# Patient Record
Sex: Female | Born: 1970 | Race: White | Hispanic: Refuse to answer | Marital: Single | Smoking: Light tobacco smoker
Health system: Southern US, Community
[De-identification: ages and names within clinical notes are randomized; demographics above are authoritative.]

## PROBLEM LIST (undated history)

## (undated) DIAGNOSIS — F419 Anxiety disorder, unspecified: Secondary | ICD-10-CM

## (undated) DIAGNOSIS — F329 Major depressive disorder, single episode, unspecified: Secondary | ICD-10-CM

## (undated) DIAGNOSIS — L732 Hidradenitis suppurativa: Secondary | ICD-10-CM

## (undated) DIAGNOSIS — E785 Hyperlipidemia, unspecified: Secondary | ICD-10-CM

## (undated) DIAGNOSIS — E119 Type 2 diabetes mellitus without complications: Secondary | ICD-10-CM

## (undated) DIAGNOSIS — M797 Fibromyalgia: Secondary | ICD-10-CM

## (undated) DIAGNOSIS — F32A Depression, unspecified: Secondary | ICD-10-CM

## (undated) DIAGNOSIS — I509 Heart failure, unspecified: Secondary | ICD-10-CM

## (undated) DIAGNOSIS — I10 Essential (primary) hypertension: Secondary | ICD-10-CM

## (undated) DIAGNOSIS — C4490 Unspecified malignant neoplasm of skin, unspecified: Secondary | ICD-10-CM

## (undated) DIAGNOSIS — T8859XA Other complications of anesthesia, initial encounter: Secondary | ICD-10-CM

## (undated) DIAGNOSIS — G4730 Sleep apnea, unspecified: Secondary | ICD-10-CM

## (undated) DIAGNOSIS — I2510 Atherosclerotic heart disease of native coronary artery without angina pectoris: Secondary | ICD-10-CM

## (undated) DIAGNOSIS — G629 Polyneuropathy, unspecified: Secondary | ICD-10-CM

## (undated) HISTORY — DX: Hidradenitis suppurativa: L73.2

## (undated) HISTORY — PX: STOMACH SURGERY: SHX791

## (undated) HISTORY — DX: Major depressive disorder, single episode, unspecified: F32.9

## (undated) HISTORY — PX: FOOT SURGERY: SHX648

## (undated) HISTORY — DX: Type 2 diabetes mellitus without complications: E11.9

## (undated) HISTORY — PX: HYDRADENITIS EXCISION: SHX5243

## (undated) HISTORY — DX: Hyperlipidemia, unspecified: E78.5

## (undated) HISTORY — DX: Anxiety disorder, unspecified: F41.9

## (undated) HISTORY — DX: Depression, unspecified: F32.A

## (undated) HISTORY — DX: Heart failure, unspecified: I50.9

## (undated) HISTORY — DX: Other complications of anesthesia, initial encounter: T88.59XA

## (undated) HISTORY — DX: Fibromyalgia: M79.7

## (undated) HISTORY — DX: Atherosclerotic heart disease of native coronary artery without angina pectoris: I25.10

## (undated) HISTORY — DX: Essential (primary) hypertension: I10

## (undated) HISTORY — DX: Unspecified malignant neoplasm of skin, unspecified: C44.90

## (undated) HISTORY — DX: Polyneuropathy, unspecified: G62.9

## (undated) HISTORY — DX: Sleep apnea, unspecified: G47.30

---

## 1996-07-12 HISTORY — PX: REDUCTION MAMMAPLASTY: SUR839

## 2012-10-11 DIAGNOSIS — L732 Hidradenitis suppurativa: Secondary | ICD-10-CM | POA: Insufficient documentation

## 2012-11-23 ENCOUNTER — Other Ambulatory Visit: Payer: Self-pay | Admitting: *Deleted

## 2012-11-23 DIAGNOSIS — N63 Unspecified lump in unspecified breast: Secondary | ICD-10-CM

## 2012-11-23 DIAGNOSIS — N644 Mastodynia: Secondary | ICD-10-CM

## 2012-11-28 DIAGNOSIS — E1159 Type 2 diabetes mellitus with other circulatory complications: Secondary | ICD-10-CM | POA: Insufficient documentation

## 2012-11-28 DIAGNOSIS — Z720 Tobacco use: Secondary | ICD-10-CM | POA: Insufficient documentation

## 2012-12-07 ENCOUNTER — Ambulatory Visit
Admission: RE | Admit: 2012-12-07 | Discharge: 2012-12-07 | Disposition: A | Discharge status: Home / Self Care | Payer: Private Health Insurance - Indemnity | Source: Ambulatory Visit | Attending: *Deleted | Admitting: *Deleted

## 2012-12-07 ENCOUNTER — Other Ambulatory Visit: Payer: Self-pay | Admitting: Nurse Practitioner

## 2012-12-07 DIAGNOSIS — N644 Mastodynia: Secondary | ICD-10-CM

## 2012-12-07 DIAGNOSIS — N63 Unspecified lump in unspecified breast: Secondary | ICD-10-CM

## 2013-02-09 DIAGNOSIS — IMO0002 Reserved for concepts with insufficient information to code with codable children: Secondary | ICD-10-CM | POA: Insufficient documentation

## 2013-02-09 DIAGNOSIS — N83201 Unspecified ovarian cyst, right side: Secondary | ICD-10-CM | POA: Insufficient documentation

## 2013-06-27 DIAGNOSIS — M7750 Other enthesopathy of unspecified foot: Secondary | ICD-10-CM | POA: Insufficient documentation

## 2013-06-27 DIAGNOSIS — M7752 Other enthesopathy of left foot: Secondary | ICD-10-CM | POA: Insufficient documentation

## 2013-07-03 ENCOUNTER — Ambulatory Visit (INDEPENDENT_AMBULATORY_CARE_PROVIDER_SITE_OTHER): Payer: Private Health Insurance - Indemnity | Admitting: Surgery

## 2013-08-06 ENCOUNTER — Encounter (INDEPENDENT_AMBULATORY_CARE_PROVIDER_SITE_OTHER): Payer: Self-pay | Admitting: Surgery

## 2013-08-06 ENCOUNTER — Encounter (INDEPENDENT_AMBULATORY_CARE_PROVIDER_SITE_OTHER): Payer: Self-pay

## 2013-08-06 ENCOUNTER — Ambulatory Visit (INDEPENDENT_AMBULATORY_CARE_PROVIDER_SITE_OTHER): Payer: Private Health Insurance - Indemnity | Admitting: Surgery

## 2013-08-06 VITALS — BP 122/78 | HR 88 | Temp 97.6°F | Resp 14 | Ht 63.0 in | Wt 228.0 lb

## 2013-08-06 DIAGNOSIS — L732 Hidradenitis suppurativa: Secondary | ICD-10-CM

## 2013-08-06 NOTE — Progress Notes (Signed)
Patient ID: Kristin Mejia, female   DOB: 30-Jun-1971, 43 y.o.   MRN: [id number]  Chief Complaint  Patient presents with  . New Evaluation    eval bil breast/abscesses?    HPI Kristin Mejia is a 43 y.o. female.  Patient presents for evaluation of hidradenitis involving both breasts. She has a history of bilateral breast reduction in the late 90s. She had significant hidradenitis until then and this is improved after breast reduction. She is developed recurrent hidradenitis involving both breasts along the inferior mammary fold medially. She isn't interested in further surgery treating these areas and has questions about further breast reduction the areas drain from time to time. No fever or chills currently. She continues to smoke. HPI  Past Medical History  Diagnosis Date  . Diabetes mellitus without complication   . Hyperlipidemia   . Hydradenitis     Past Surgical History  Procedure Laterality Date  . Foot surgery      bone removal/tendon replacement  . Stomach surgery      tummy tuck  . Cesarean section    . Hydradenitis excision      12 x 1998/2005 on both thighs/axilla/breast    Family History  Problem Relation Age of Onset  . Cancer Maternal Aunt     throat  . Cancer Maternal Grandfather     throat    Social History History  Substance Use Topics  . Smoking status: Current Every Day Smoker -- 1.00 packs/day  . Smokeless tobacco: Never Used  . Alcohol Use: No    Allergies  Allergen Reactions  . Wellbutrin [Bupropion] Other (See Comments)    Increased heart rate  . Enablex [Darifenacin Hydrobromide Er] Rash  . Lyrica [Pregabalin] Rash  . Vesicare [Solifenacin] Rash    Current Outpatient Prescriptions  Medication Sig Dispense Refill  . cyclobenzaprine (FLEXERIL) 10 MG tablet Take 10 mg by mouth 3 (three) times daily as needed for muscle spasms.      Marland Kitchen escitalopram (LEXAPRO) 20 MG tablet Take 20 mg by mouth daily.      Marland Kitchen glipiZIDE (GLUCOTROL XL) 10 MG  24 hr tablet Take 10 mg by mouth daily with breakfast.      . hydrochlorothiazide (HYDRODIURIL) 25 MG tablet Take 25 mg by mouth daily.      Marland Kitchen HYDROcodone-acetaminophen (NORCO) 10-325 MG per tablet Take 1 tablet by mouth every 6 (six) hours as needed.      Marland Kitchen LORazepam (ATIVAN) 2 MG tablet Take 2 mg by mouth every 6 (six) hours as needed for anxiety.      . metformin (FORTAMET) 1000 MG (OSM) 24 hr tablet Take 1,000 mg by mouth daily with breakfast.      . morphine (MSIR) 15 MG tablet Take 15 mg by mouth every 4 (four) hours as needed for severe pain.      Marland Kitchen triazolam (HALCION) 0.25 MG tablet Take 0.25 mg by mouth at bedtime as needed for sleep.       No current facility-administered medications for this visit.    Review of Systems Review of Systems  Constitutional: Negative.   HENT: Negative.   Eyes: Negative.   Respiratory: Negative.   Cardiovascular: Negative.   Gastrointestinal: Negative.   Endocrine: Negative.   Genitourinary: Negative.   Allergic/Immunologic: Negative.   Neurological: Negative.   Hematological: Negative.   Psychiatric/Behavioral: Negative.     Blood pressure 122/78, pulse 88, temperature 97.6 F (36.4 C), temperature source Temporal, resp. rate 14, height 5\' 3"  (1.6 m),  weight 228 lb (103.42 kg).  Physical Exam Physical Exam  Constitutional: She is oriented to person, place, and time. She appears well-developed and well-nourished.  HENT:  Head: Normocephalic and atraumatic.  Eyes: Pupils are equal, round, and reactive to light. No scleral icterus.  Neck: Normal range of motion. Neck supple.  Pulmonary/Chest:    Neurological: She is alert and oriented to person, place, and time.  Skin: Skin is warm and dry.  Psychiatric: She has a normal mood and affect. Her behavior is normal. Judgment and thought content normal.    Data Reviewed NONE  Assessment    HIDRADENITIS BILATERAL INFERIOR MAMMARY FOLD BILATERALLY    Plan    Pt desires consultation  with plastic surgeon for consideration of further breast reduction and treatment of hidradenitis of her breasts.  Her disease is minimal but I think and evaluation helpful.  No active infection noted currently. Follow up here as needed.        Kristin Mejia A. 08/06/2013, 3:49 PM

## 2013-08-08 ENCOUNTER — Telehealth (INDEPENDENT_AMBULATORY_CARE_PROVIDER_SITE_OTHER): Payer: Self-pay | Admitting: *Deleted

## 2013-08-08 NOTE — Telephone Encounter (Signed)
LMOM for pt to return my call.  I was calling to notify her of the appt with Dr. Kelly SplinterSanger on 08/21/13 @ 10:30am.  Their phone number is [phone] if pt needs to reschedule.  Their address is [address]

## 2013-08-13 DIAGNOSIS — R748 Abnormal levels of other serum enzymes: Secondary | ICD-10-CM | POA: Insufficient documentation

## 2013-08-13 DIAGNOSIS — E669 Obesity, unspecified: Secondary | ICD-10-CM | POA: Insufficient documentation

## 2013-08-13 DIAGNOSIS — E66812 Obesity, class 2: Secondary | ICD-10-CM | POA: Insufficient documentation

## 2013-08-16 NOTE — Telephone Encounter (Signed)
LMOM for pt to return my call to give her the below information.

## 2013-11-23 DIAGNOSIS — R5383 Other fatigue: Secondary | ICD-10-CM | POA: Insufficient documentation

## 2014-01-28 DIAGNOSIS — Z85828 Personal history of other malignant neoplasm of skin: Secondary | ICD-10-CM | POA: Insufficient documentation

## 2014-03-01 ENCOUNTER — Other Ambulatory Visit: Payer: Self-pay

## 2014-03-01 DIAGNOSIS — Z1231 Encounter for screening mammogram for malignant neoplasm of breast: Secondary | ICD-10-CM

## 2014-03-14 ENCOUNTER — Ambulatory Visit: Payer: Private Health Insurance - Indemnity

## 2014-03-25 ENCOUNTER — Ambulatory Visit
Admission: RE | Admit: 2014-03-25 | Discharge: 2014-03-25 | Disposition: A | Discharge status: Home / Self Care | Payer: Managed Care, Other (non HMO) | Source: Ambulatory Visit

## 2014-03-25 DIAGNOSIS — Z1231 Encounter for screening mammogram for malignant neoplasm of breast: Secondary | ICD-10-CM

## 2014-08-22 ENCOUNTER — Ambulatory Visit
Admission: RE | Admit: 2014-08-22 | Discharge: 2014-08-22 | Disposition: A | Discharge status: Home / Self Care | Payer: Managed Care, Other (non HMO) | Source: Ambulatory Visit | Attending: Gastroenterology | Admitting: Gastroenterology

## 2014-08-22 ENCOUNTER — Other Ambulatory Visit: Payer: Self-pay | Admitting: Gastroenterology

## 2014-08-22 DIAGNOSIS — R109 Unspecified abdominal pain: Secondary | ICD-10-CM

## 2015-08-18 ENCOUNTER — Other Ambulatory Visit: Payer: Self-pay

## 2015-08-18 DIAGNOSIS — Z1231 Encounter for screening mammogram for malignant neoplasm of breast: Secondary | ICD-10-CM

## 2015-08-29 ENCOUNTER — Ambulatory Visit
Admission: RE | Admit: 2015-08-29 | Discharge: 2015-08-29 | Disposition: A | Discharge status: Home / Self Care | Payer: Managed Care, Other (non HMO) | Source: Ambulatory Visit

## 2015-08-29 DIAGNOSIS — Z1231 Encounter for screening mammogram for malignant neoplasm of breast: Secondary | ICD-10-CM

## 2015-09-04 DIAGNOSIS — E1169 Type 2 diabetes mellitus with other specified complication: Secondary | ICD-10-CM | POA: Insufficient documentation

## 2015-09-04 DIAGNOSIS — E785 Hyperlipidemia, unspecified: Secondary | ICD-10-CM

## 2015-09-12 ENCOUNTER — Emergency Department (HOSPITAL_COMMUNITY)
Admission: EM | Admit: 2015-09-12 | Discharge: 2015-09-12 | Disposition: A | Discharge status: Home / Self Care | Payer: Managed Care, Other (non HMO) | Attending: Emergency Medicine | Admitting: Emergency Medicine

## 2015-09-12 ENCOUNTER — Encounter (HOSPITAL_COMMUNITY): Payer: Self-pay | Admitting: Emergency Medicine

## 2015-09-12 DIAGNOSIS — E119 Type 2 diabetes mellitus without complications: Secondary | ICD-10-CM | POA: Insufficient documentation

## 2015-09-12 DIAGNOSIS — R2232 Localized swelling, mass and lump, left upper limb: Secondary | ICD-10-CM | POA: Diagnosis not present

## 2015-09-12 DIAGNOSIS — Z79899 Other long term (current) drug therapy: Secondary | ICD-10-CM | POA: Diagnosis not present

## 2015-09-12 DIAGNOSIS — F17200 Nicotine dependence, unspecified, uncomplicated: Secondary | ICD-10-CM | POA: Diagnosis not present

## 2015-09-12 DIAGNOSIS — Z7984 Long term (current) use of oral hypoglycemic drugs: Secondary | ICD-10-CM | POA: Diagnosis not present

## 2015-09-12 DIAGNOSIS — Z23 Encounter for immunization: Secondary | ICD-10-CM | POA: Insufficient documentation

## 2015-09-12 MED ORDER — LIDOCAINE-EPINEPHRINE (PF) 2 %-1:200000 IJ SOLN: 10.0000 mL | Freq: Once | INTRAMUSCULAR | Status: DC

## 2015-09-12 MED ORDER — LIDOCAINE-EPINEPHRINE (PF) 2 %-1:200000 IJ SOLN
INTRAMUSCULAR | Status: AC
  Filled 2015-09-12: qty 20

## 2015-09-12 MED ORDER — TETANUS-DIPHTH-ACELL PERTUSSIS 5-2.5-18.5 LF-MCG/0.5 IM SUSP
0.5000 mL | Freq: Once | INTRAMUSCULAR | Status: AC
  Administered 2015-09-12: 0.5 mL via INTRAMUSCULAR
  Filled 2015-09-12: qty 0.5

## 2015-09-12 NOTE — Discharge Instructions (Signed)
You have been seen today for an axillary mass. It is recommended that you follow-up with a general surgeon using the number provided for reevaluation and chronic management of this issue. Follow up with PCP as needed. Return to ED should symptoms worsen. Ibuprofen or Tylenol may be used for pain.  RESOURCE GUIDE  Chronic Pain Problems: Contact [facility]  [phone] Patients need to be referred by their primary care doctor.  Insufficient Money for Medicine: Contact United Way:  call "211" or [facility] 409-100-2274.  No Primary Care Doctor: - Call [facility]  [phone] - can help you locate a primary care doctor that  accepts your insurance, provides certain services, etc. - Physician Referral Service- [phone]  Agencies that provide inexpensive medical care: - [facility]  [phone] - [facility]  [phone] - [facility]  [phone] - Women's Clinic  [phone] - Planned Parenthood  [phone] [facility]  [phone]  Medicaid-accepting [facility] Providers: - [facility]- [address]  [phone], Mon-Fri 9am-7pm, Sat 9am-1pm - [facility]- [address] [place], Suite [place]  [phone] - [facility]- [address], [address] [place]  [phone] [facility]- [address]  [phone] - Renaye Rakers- [address] [place], [address], [phone]  Only accepts [place] patients after they have their name  applied to their card  Self Pay (no insurance) in [place]: - Sickle Cell Patients: Dr Willey Blade, [facility]  [address] [place], [phone] - [facility]- [address] [place]  [phone]       [facility] [place]- [address]       -     [facility]- see information above (Speak to [employer] if you do not have  insurance)       -  [facility]- [address] [place], [phone]       -  [facility]- [address],  [phone]       -  [facility]- [address], [phone]       -  Dr Julio Sicks-  [address], [place], [phone]       -  [facility]- [address] [phone]       -  [facility]- [address] [phone], also [address] [phone]       -    [facility]- [address] [place], [phone], 1st & 3rd Saturday   every month, 10am-1pm  1) Find a Doctor and Pay Out of Pocket Although you won't have to find out who is covered by your insurance plan, it is a good idea to ask around and get recommendations. You will then need to call the office and see if the doctor you have chosen will accept you as a new patient and what types of options they offer for patients who are self-pay. Some doctors offer discounts or will set up payment plans for their patients who do not have insurance, but you will need to ask so you aren't surprised when you get to your appointment.  2) Contact Your Local Health Department Not all health departments have doctors that can see patients for sick  visits, but many do, so it is worth a call to see if yours does. If you don't know where your local health department is, you can check in your phone book. The CDC also has a tool to help you locate your state's health department, and many state websites also have listings of all of their local health departments.  3) Find a Walk-in Clinic If your illness is not likely to be very severe or complicated, you may want to try a walk in clinic. These are popping up all over the country in pharmacies, drugstores, and shopping centers. They're usually staffed by nurse practitioners or physician assistants that have been trained to treat common illnesses and complaints. They're usually fairly quick and inexpensive. However, if you have serious  medical issues or chronic medical problems, these are probably not your best option  STD Testing - [facility] of [facility] [place], STD Clinic, [address], [place], phone [phone] or [phone].  Monday - Friday, call for an appointment. [facility]- [place] Department of [facility] [place], STD Clinic, [place], [place], phone [phone] or [phone].  Monday - Friday, call for an appointment.  Abuse/Neglect: [facility]- [place] Child Abuse Hotline [phone](336) (440)[phone] [facility] [place] Child Abuse Hotline 6302110733(747)491-0259 (After Hours)  Emergency Shelter:  [facility] [phone](336) [phone]  Maternity Homes: - [facility] [phone](336) [phone] - [facility] [phone]) [phone]  MRSA Hotline #:   2016051983(513) 107-5001  [facility] Resources  [facility]  United Way [facility]. [address]                 [address]         [address] [place]  [place]                                               [place]                              [place] Phone:  [phone]                                  Phone:  [phone]                   Phone:  [phone]  [facility], [phone] - [facility] - [phone]- [phone]       -     [facility] in [place], [address],                                  [phone], [facility]  [place] Child Abuse Hotline 505 244 9127(336) 727-357-3921 or 303-628-2389(336) 319-021-0607 (After Hours)   Behavioral Health Services  Substance Abuse Resources: - [facility]  [phone] - [facility] (539) [phone] - [facility] ([phone] Floydene Flock- [facility] [phone] - Residential & Outpatient Substance Abuse Program  [phone]  Psychological Services: Tressie Ellis- [facility]  [phone] [phone] - [facility]  [phone] - [facility]  [facility], [phone] [place] [address], [place], ACCESS LINE: [phone]  or (541)[phone], [employer]za  Dental Assistance  If unable to pay or uninsured, contact:  [facility] or [facility]. to become qualified for the adult dental clinic.  Patients with Medicaid: [facility] [phone] [address], [phone] [address], [phone]  If unable to pay, or uninsured, contact [facility] [phone]) or [facility] Department [phone] in [place], [phone] in [facility]) to become qualified for the adult dental clinic   Other Low-Cost Community Dental Services: - [facility]- [address] [place], [place], [place], [address], [phone], Ext. [address], 2nd and 4th Thursday of the month at 6:30am.  10 clients each day by appointment, can sometimes see walk-in patients if someone does not show for an appointment. [facility]- [address] [place]ins [place], [place], [address], [phone] - [facility]- [address] [place], [place], [address], [phone] - [facility]- [phone] [facility]- [phone] [facility]- [phone]

## 2015-09-12 NOTE — ED Notes (Signed)
Pt states hx of axillary abscesses.  States that she has one under her lt arm x 2 wks.  No fevers, chills, vomiting, or diarrhea.

## 2015-09-12 NOTE — ED Provider Notes (Signed)
CSN: [id number]     Arrival date & time 09/12/15  1009 History   First MD Initiated Contact with Patient 09/12/15 1105     Chief Complaint  Patient presents with  . Abscess     (Consider location/radiation/quality/duration/timing/severity/associated sxs/prior Treatment) HPI   Kristin Mejia is a 45 y.o. female, with a history of hydradenitis, presenting to the ED with a left axillary mass, but the patient describes as a cyst or an abscess. Patient first noticed it 2 weeks ago. States that she gets these all over her body frequently. Patient has not yet established herself with a surgeon in this area. Patient denies fever/chills, drainage from the site, nausea/vomiting, neuro or functional deficits, or any other complaints.  Past Medical History  Diagnosis Date  . Diabetes mellitus without complication (HCC)   . Hyperlipidemia   . Hydradenitis    Past Surgical History  Procedure Laterality Date  . Foot surgery      bone removal/tendon replacement  . Stomach surgery      tummy tuck  . Cesarean section    . Hydradenitis excision      12 x 1998/2005 on both thighs/axilla/breast   Family History  Problem Relation Age of Onset  . Cancer Maternal Aunt     throat  . Cancer Maternal Grandfather     throat   Social History  Substance Use Topics  . Smoking status: Current Every Day Smoker -- 1.00 packs/day  . Smokeless tobacco: Never Used  . Alcohol Use: No   OB History    No data available     Review of Systems  Constitutional: Negative for fever, chills and diaphoresis.  Respiratory: Negative for shortness of breath.   Skin: Negative for rash and wound.       Left axillary mass.  Neurological: Negative for weakness and numbness.      Allergies  Wellbutrin; Enablex; Lyrica; and Vesicare  Home Medications   Prior to Admission medications   Medication Sig Start Date End Date Taking? Authorizing Provider  cyclobenzaprine (FLEXERIL) 10 MG tablet Take 10 mg by mouth  3 (three) times daily as needed for muscle spasms.   Yes Historical Provider, MD  escitalopram (LEXAPRO) 20 MG tablet Take 20 mg by mouth daily.   Yes Historical Provider, MD  glipiZIDE (GLUCOTROL XL) 5 MG 24 hr tablet Take 5 mg by mouth daily. 08/12/15 08/11/16 Yes Historical Provider, MD  hydrochlorothiazide (HYDRODIURIL) 25 MG tablet Take 25 mg by mouth daily.   Yes Historical Provider, MD  HYDROcodone-acetaminophen (NORCO) 10-325 MG per tablet Take 1 tablet by mouth every 6 (six) hours as needed for moderate pain or severe pain.    Yes Historical Provider, MD  LORazepam (ATIVAN) 2 MG tablet Take 2 mg by mouth every 6 (six) hours as needed for anxiety.   Yes Historical Provider, MD  metformin (FORTAMET) 1000 MG (OSM) 24 hr tablet Take 1,000 mg by mouth daily with breakfast.   Yes Historical Provider, MD  morphine (MSIR) 15 MG tablet Take 15 mg by mouth every 4 (four) hours as needed for moderate pain or severe pain.    Yes Historical Provider, MD  tiZANidine (ZANAFLEX) 4 MG tablet Take 4 mg by mouth 3 (three) times daily as needed for muscle spasms.  09/06/15  Yes Historical Provider, MD  triazolam (HALCION) 0.25 MG tablet Take 0.25 mg by mouth at bedtime as needed for sleep.   Yes Historical Provider, MD   BP 129/85 mmHg  Pulse 92  Temp(Src) 98 F (36.7 C) (Oral)  Resp 16  SpO2 98% Physical Exam  Constitutional: She appears well-developed and well-nourished. No distress.  HENT:  Head: Normocephalic and atraumatic.  Eyes: Conjunctivae are normal.  Cardiovascular: Normal rate and regular rhythm.   Pulmonary/Chest: Effort normal.  Neurological: She is alert.  Skin: Skin is warm and dry. She is not diaphoretic.  One centimeter, round, and mobile nodule, possibly indicating induration, and the left axilla. Somewhat tender to the touch. No overlying erythema, increased warmth, or other signs of infection.  Nursing note and vitals reviewed.   ED Course  ..Incision and Drainage Date/Time:  09/12/2015 11:19 AM Performed by: Anselm PancoastJOY, SHAMarland KitchenWN C Authorized by: Harolyn RutherfordJOY, Wetzel Meester C Consent: Verbal consent obtained. Risks and benefits: risks, benefits and alternatives were discussed Consent given by: patient Patient understanding: patient states understanding of the procedure being performed Patient consent: the patient's understanding of the procedure matches consent given Procedure consent: procedure consent matches procedure scheduled Patient identity confirmed: verbally with patient and arm band Time out: Immediately prior to procedure a "time out" was called to verify the correct patient, procedure, equipment, support staff and site/side marked as required. Type: cyst Body area: trunk (Left axilla) Anesthesia: local infiltration Local anesthetic: lidocaine 2% with epinephrine Anesthetic total: 2 ml Patient sedated: no Scalpel size: 11 Incision type: single straight Incision depth: subcutaneous Complexity: simple Drainage: bloody Drainage amount: scant Wound treatment: wound left open Packing material: none Patient tolerance: Patient tolerated the procedure well with no immediate complications   (including critical care time)  EMERGENCY DEPARTMENT US SOFT TISSUE INTERPRETATION "Study: Limited Ultrasound of the noted body part in comments below"  INDICATIONS: Pain Multiple views of the body part are obtained with a multi-frequency linear probe  PERFORMED BY:  Myself  IMAGES ARCHIVED?: Yes  SIDE:Left  BODY PART:Axilla  FINDINGS: Cellulitis absent and Other : Questionable abscess present  LIMITATIONS: None  INTERPRETATION:  No cellulitis noted  COMMENT:  There was no signs of cellulitis. Very small, questionable area of fluid collection.  Imaging Review No results found. I have personally reviewed and evaluated these images as part of my medical decision-making.   EKG Interpretation None      MDM   Final diagnoses:  Axillary mass, left    Shirl HarrisJennifer Demuro  presents with a left axillary mass for the last 2 weeks.  There is a questionable fluid collection seen on ultrasound. There were no underlying vessels and the mass does not resemble lymph node. Minimal fluid was able to be expressed during the I&D procedure. No signs of overlying cellulitis that would require antibiotic. Patient adds that she gets these cysts and abscesses so often she no longer takes antibiotics for them unless there are signs of cellulitis. Patient to follow-up with general surgery. Home care instructions and return precautions discussed. Patient voiced understanding of these instructions. Patient appears safe for discharge at this time.  Anselm PancoastShawn C Benz Vand[place], PA-C 09/12/15 1159  Rolland PorterMark James, MD 09/22/15 [phone]

## 2016-09-08 ENCOUNTER — Ambulatory Visit (INDEPENDENT_AMBULATORY_CARE_PROVIDER_SITE_OTHER): Payer: Commercial Managed Care - PPO | Admitting: Family Medicine

## 2016-09-08 ENCOUNTER — Other Ambulatory Visit (INDEPENDENT_AMBULATORY_CARE_PROVIDER_SITE_OTHER): Payer: Commercial Managed Care - PPO

## 2016-09-08 ENCOUNTER — Encounter: Payer: Self-pay | Admitting: Family Medicine

## 2016-09-08 VITALS — BP 118/80 | HR 88 | Temp 98.3°F | Ht 63.0 in | Wt 217.2 lb

## 2016-09-08 DIAGNOSIS — R5382 Chronic fatigue, unspecified: Secondary | ICD-10-CM | POA: Diagnosis not present

## 2016-09-08 DIAGNOSIS — E785 Hyperlipidemia, unspecified: Secondary | ICD-10-CM | POA: Diagnosis not present

## 2016-09-08 DIAGNOSIS — G894 Chronic pain syndrome: Secondary | ICD-10-CM | POA: Diagnosis not present

## 2016-09-08 DIAGNOSIS — E559 Vitamin D deficiency, unspecified: Secondary | ICD-10-CM

## 2016-09-08 DIAGNOSIS — E1159 Type 2 diabetes mellitus with other circulatory complications: Secondary | ICD-10-CM

## 2016-09-08 DIAGNOSIS — E1169 Type 2 diabetes mellitus with other specified complication: Secondary | ICD-10-CM

## 2016-09-08 DIAGNOSIS — M791 Myalgia, unspecified site: Secondary | ICD-10-CM

## 2016-09-08 DIAGNOSIS — E118 Type 2 diabetes mellitus with unspecified complications: Secondary | ICD-10-CM

## 2016-09-08 DIAGNOSIS — R21 Rash and other nonspecific skin eruption: Secondary | ICD-10-CM | POA: Diagnosis not present

## 2016-09-08 DIAGNOSIS — E1165 Type 2 diabetes mellitus with hyperglycemia: Secondary | ICD-10-CM | POA: Insufficient documentation

## 2016-09-08 DIAGNOSIS — IMO0002 Reserved for concepts with insufficient information to code with codable children: Secondary | ICD-10-CM | POA: Insufficient documentation

## 2016-09-08 LAB — COMPREHENSIVE METABOLIC PANEL
ALT: 64 U/L — AB (ref 0–35)
AST: 45 U/L — AB (ref 0–37)
Albumin: 4.2 g/dL (ref 3.5–5.2)
Alkaline Phosphatase: 80 U/L (ref 39–117)
BILIRUBIN TOTAL: 0.6 mg/dL (ref 0.2–1.2)
BUN: 9 mg/dL (ref 6–23)
CO2: 27 meq/L (ref 19–32)
Calcium: 9.4 mg/dL (ref 8.4–10.5)
Chloride: 100 mEq/L (ref 96–112)
Creatinine, Ser: 0.57 mg/dL (ref 0.40–1.20)
GFR: 121.13 mL/min (ref >60.00)
Glucose, Bld: 320 mg/dL — ABNORMAL HIGH (ref 70–99)
Potassium: 3.6 mEq/L (ref 3.5–5.1)
Sodium: 134 mEq/L — ABNORMAL LOW (ref 135–145)
Total Protein: 7 g/dL (ref 6.0–8.3)

## 2016-09-08 LAB — CBC
HCT: 44.8 % (ref 36.0–46.0)
Hemoglobin: 15.2 g/dL — ABNORMAL HIGH (ref 12.0–15.0)
MCHC: 34.1 g/dL (ref 30.0–36.0)
MCV: 92.3 fl (ref 78.0–100.0)
Platelets: 266 10*3/uL (ref 150.0–400.0)
RBC: 4.85 Mil/uL (ref 3.87–5.11)
RDW: 12.3 % (ref 11.5–15.5)
WBC: 8.4 10*3/uL (ref 4.0–10.5)

## 2016-09-08 LAB — VITAMIN D 25 HYDROXY (VIT D DEFICIENCY, FRACTURES): VITD: 13.1 ng/mL — ABNORMAL LOW (ref 30.00–100.00)

## 2016-09-08 LAB — TSH: TSH: 0.52 u[IU]/mL (ref 0.35–4.50)

## 2016-09-08 LAB — LIPID PANEL
CHOL/HDL RATIO: 7
Cholesterol: 202 mg/dL — ABNORMAL HIGH (ref 0–200)
HDL: 29.9 mg/dL — ABNORMAL LOW (ref >39.00)
NONHDL: 171.9
TRIGLYCERIDES: 364 mg/dL — AB (ref 0.0–149.0)
VLDL: 72.8 mg/dL — ABNORMAL HIGH (ref 0.0–40.0)

## 2016-09-08 LAB — SEDIMENTATION RATE: Sed Rate: 27 mm/hr — ABNORMAL HIGH (ref 0–20)

## 2016-09-08 LAB — LDL CHOLESTEROL, DIRECT: LDL DIRECT: 116 mg/dL

## 2016-09-08 LAB — HEMOGLOBIN A1C: Hgb A1c MFr Bld: 13.4 % — ABNORMAL HIGH (ref 4.6–6.5)

## 2016-09-08 LAB — MICROALBUMIN / CREATININE URINE RATIO
Creatinine,U: 121.7 mg/dL
MICROALB/CREAT RATIO: 2.7 mg/g (ref 0.0–30.0)
Microalb, Ur: 3.3 mg/dL — ABNORMAL HIGH (ref 0.0–1.9)

## 2016-09-08 LAB — VITAMIN B12: Vitamin B-12: 290 pg/mL (ref 211–911)

## 2016-09-08 NOTE — Progress Notes (Signed)
Subjective:    Patient ID: Kristin Mejia, female    DOB: [birth year], 46 y.o.   MRN: [id number]  HPI This is a 46 yo female who presents today to establish care. She has multiple medical problems and sees multiple specialists- pain management, neurology, cardiology, endocrinology, gynecology. Her main concerns today are cough x 3 weeks, muscle aches and fatigue. Labs last checked 3 months ago.  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   Cough- She has had a cough for 3 weeks. Started with nasal drainage and has settled in her chest, sputum clear or yellow. No SOB or wheeze. Doesn't think she is running fevers. Has not been taking any medication for cough or using albuterol inhaler. Cough seems to be improving.   Fatigue and muscle aches- reports chronic fatigue and "feeling like crap," for about 3-4 years, has noticed increased, generalized muscle and joint pain over the last 3 weeks. Morning stiffness, sits a lot at her job. No regular exercise. Has sleep apnea, compliant with CPAP. Sees neurology. Goes to bed by 11, gets up 6:30, always exhausted, mostly sleeps on weekends to catch up from work week. Has 47 year old twin girls and is not able to do things with them due to exhaustion. Neurologist thinks exhaustion is related to her chronic pain meds.   DM type 2- sees endocrine, has not been in about a year. Last HgbA1C 3 months ago, over 12. Admits to non compliance and not being in a good place with food choices. She states she is ready to work on things. Eats crackers for breakfast and lunch, eats sweets frequently. Has noticed numbness in fingers and toes.   Hydradenitis/chronic pain- Sees pain doctor (Dr. Janyth Pupa) for hydradenitis pain. Reports that she has been on same regimen for 10 years without needing to increase. Review of records reveals she gets Morphine Sulfate  IR 15 mg, #150 every month, Hydrocodone-acetaminophen 10-325, #180 every month, lorazepam 2 mg, #150 every 3 months, Triazolam 0.125 mg #60 every 3 months. She has had multiple surgeries and has been on antibiotics off and on for  Years.   Perimenopause- Having vasomotor symptoms, thinks she is perimenopausal. Does not feel depressed, feels very stressed. Feels foggy. No menses for 6-8 months. Not sexually active.   Vitamin D insufficiency- Has been diagnosed with "extremely low" vitamin D and has been supplementing weekly for about 6 weeks. Felt a little "perkier" after first starting.   Diabetes Mellitus type 2- Reports that she had Hgba1c 12.5 about 3 months ago. Got new meter yesterday. Has been compliant with metformin 1000 mg twice a day. Never been on insulin. She sees Magda Bernheim, NP in endocrinology, needs to make appointment.    CAD/CHF- Sees Dr. Kennyth Arnold at [facility]. Had a positive stress test 12/24/15, underwent cardiac catherization which showed "mild non-obstructive ASCAD, mild vasospasm, diastolic heart failure." LVEDP 25 mmHg. Normal echo 11/28/15. Taking metoprolol, isosorbide mononitrate, ASA, simvastatin. Denies chest pain, SOB, has occasional swelling of ankles.   She has upcoming appointments with cardiology, neurology and ob/gyn.  Past Medical History:  Diagnosis Date  . Anxiety   . CHF (congestive heart failure) (HCC)   . Depression   . Diabetes mellitus without complication (HCC)   . Hydradenitis   . Hyperlipidemia    Past Surgical History:  Procedure Laterality Date  . CESAREAN SECTION    .  FOOT SURGERY     bone removal/tendon replacement  . HYDRADENITIS EXCISION     12 x 1998/2005 on both thighs/axilla/breast  . REDUCTION MAMMAPLASTY Bilateral 1998  . STOMACH SURGERY     tummy tuck   Family History  Problem Relation Age of Onset  . Cancer Maternal Aunt     throat  . Cancer Maternal Grandfather     throat   Social History  Substance Use Topics  . Smoking  status: Current Every Day Smoker    Packs/day: 1.00    Types: Cigarettes  . Smokeless tobacco: Never Used  . Alcohol use No     Review of Systems  Constitutional: Positive for diaphoresis and fatigue. Negative for fever.  Respiratory: Positive for cough. Negative for chest tightness, shortness of breath and wheezing.   Cardiovascular: Positive for palpitations (occasional, better on metoprolol.) and leg swelling. Negative for chest pain.  Gastrointestinal: Negative for constipation and diarrhea.  Musculoskeletal: Positive for arthralgias and myalgias.  Skin: Positive for color change (chronic redness of cheeks and eyelids, not rosacea according to dermatology. ).       Has redness and puffiness of upper eyelids and upper cheeks. Thinks puffiness might be related to full mask she wears with CPAP.   Psychiatric/Behavioral: Positive for sleep disturbance. Negative for dysphoric mood. The patient is nervous/anxious.        Objective:   Physical Exam  Constitutional: She is oriented to person, place, and time. She appears well-developed and well-nourished. No distress.  Obese.    HENT:  Head: Normocephalic and atraumatic.  Mouth/Throat: Oropharynx is clear and moist.  Eyes: Conjunctivae are normal.  Cardiovascular: Normal rate, regular rhythm and normal heart sounds.   Pulmonary/Chest: Effort normal and breath sounds normal.  Musculoskeletal: She exhibits no edema.  Lymphadenopathy:    She has no cervical adenopathy.  Neurological: She is alert and oriented to person, place, and time.  Skin: Skin is warm and dry. Rash noted. She is not diaphoretic.  Eyelids and cheeks mildly puffy and mildly erythematous.   Psychiatric: She has a normal mood and affect. Her behavior is normal. Judgment and thought content normal.  Vitals reviewed.     BP 118/80 (BP Location: Left Arm, Patient Position: Sitting, Cuff Size: Normal)   Pulse 88   Temp 98.3 F (36.8 C) (Oral)   Ht 5\' 3"  (1.6 m)    Wt 217 lb 4 oz (98.5 kg)   SpO2 94%   BMI 38.48 kg/m  Wt Readings from Last 3 Encounters:  09/08/16 217 lb 4 oz (98.5 kg)  08/06/13 228 lb (103.4 kg)   Depression screen PHQ 2/9 09/08/2016  Decreased Interest 0  Down, Depressed, Hopeless 0  PHQ - 2 Score 0       Assessment & Plan:  1. Type 2 diabetes mellitus with other circulatory complication, without long-term current use of insulin (HCC) - will see what HgA1C shows, likely to be high, will need to add additional medication to Glucophage - encouraged daily/qod walking for 10-15 minutes to start and more regimented approach to meals. - CBC - Comprehensive metabolic panel - Hemoglobin A1c - Lipid panel - Microalbumin / creatinine urine ratio - TSH  2. Vitamin D insufficiency - VITAMIN D 25 Hydroxy (Vit-D Deficiency, Fractures)  3. Myalgia - unknown cause - CBC - Comprehensive metabolic panel - TSH - Sedimentation rate - Rheumatoid Factor  4. Chronic fatigue - discussed role of chronically elevated blood sugar, also suspect overmedication with opioids/benzo could  be contributing - CBC - VITAMIN D 25 Hydroxy (Vit-D Deficiency, Fractures)  5. Rash of face - looks like rosacea, discussed OTC treatment options - CBC  6. Hyperlipidemia associated with type 2 diabetes mellitus (HCC) - Lipid panel - TSH  7. Chronic pain syndrome - follow up with Dr. Janyth PupaNicholas as scheduled   Kristin Reeeborah Clearnce Leja, FNP-BC  [facility] [place], [facility]  09/08/2016 12:10 PM

## 2016-09-08 NOTE — Progress Notes (Signed)
Pre visit review using our clinic review tool, if applicable. No additional management support is needed unless otherwise documented below in the visit note. 

## 2016-09-08 NOTE — Patient Instructions (Addendum)
For hot flashes you can try Vitamin E 100 IU twice a day  Please sign up for Mychart, it is a great way for us to communicate. I will contact you regarding your lab results.  If your sputum increases or if you develop any fever or worsening cough, please let me know. For cough, you can try your albuterol inhaler every 4-6 hours.   Try to squeeze in 10-15 minutes of walking every day.

## 2016-09-09 LAB — RHEUMATOID FACTOR

## 2016-09-21 ENCOUNTER — Telehealth: Payer: Self-pay | Admitting: Family Medicine

## 2016-09-21 ENCOUNTER — Other Ambulatory Visit: Payer: Self-pay | Admitting: Family Medicine

## 2016-09-21 DIAGNOSIS — L989 Disorder of the skin and subcutaneous tissue, unspecified: Secondary | ICD-10-CM

## 2016-09-21 NOTE — Telephone Encounter (Signed)
Please call patient and let her know that I placed the referral. She should receive a call from our referral coordinator in the next couple of days.

## 2016-09-21 NOTE — Telephone Encounter (Signed)
Patient called to request a dermatology referral. Patient has a wart on face that may need to get removed. 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 Please call patient to advise.

## 2016-09-21 NOTE — Telephone Encounter (Signed)
Okay to place referral

## 2016-09-22 ENCOUNTER — Telehealth: Payer: Self-pay | Admitting: Family Medicine

## 2016-09-22 NOTE — Telephone Encounter (Signed)
Please call patient and find out if she was able to see her endocrinologist. If not, please follow up asap to start additional medication for diabetes.

## 2016-09-22 NOTE — Telephone Encounter (Signed)
Called and left voicemail for pt to return call to office.  

## 2016-09-22 NOTE — Telephone Encounter (Signed)
Opened in error

## 2016-09-23 NOTE — Telephone Encounter (Signed)
Called and left voicemail for pt to return call to office.  

## 2016-09-29 NOTE — Telephone Encounter (Signed)
Left message on voicemail to call office.  

## 2016-10-04 ENCOUNTER — Encounter: Payer: Self-pay | Admitting: Emergency Medicine

## 2016-10-04 NOTE — Telephone Encounter (Signed)
Mailed pt a letter to contact the office.

## 2016-10-04 NOTE — Telephone Encounter (Signed)
Letter mailed to pt.  

## 2016-11-19 ENCOUNTER — Telehealth: Payer: Self-pay | Admitting: Emergency Medicine

## 2016-11-19 NOTE — Telephone Encounter (Signed)
Patient called to follow up on letter mailed 2 months ago. Patient states that shes not good at checking her mail and hasn't seen her endocrinologist and doesn't have an appointment until next month. I informed NP Leone PayorGessner and she informed me that patient should make an appointment with her to start medication for Diabetes. Patient scheduled 11/22/16 at 4:00pm. Understanding verbalized nothing further needed at this time.

## 2016-11-22 ENCOUNTER — Ambulatory Visit (INDEPENDENT_AMBULATORY_CARE_PROVIDER_SITE_OTHER): Payer: Commercial Managed Care - PPO | Admitting: Family Medicine

## 2016-11-22 ENCOUNTER — Encounter: Payer: Self-pay | Admitting: Family Medicine

## 2016-11-22 VITALS — Temp 98.6°F | Wt 222.2 lb

## 2016-11-22 DIAGNOSIS — L608 Other nail disorders: Secondary | ICD-10-CM

## 2016-11-22 DIAGNOSIS — E1159 Type 2 diabetes mellitus with other circulatory complications: Secondary | ICD-10-CM | POA: Diagnosis not present

## 2016-11-22 DIAGNOSIS — L732 Hidradenitis suppurativa: Secondary | ICD-10-CM | POA: Diagnosis not present

## 2016-11-22 DIAGNOSIS — R4586 Emotional lability: Secondary | ICD-10-CM

## 2016-11-22 DIAGNOSIS — F39 Unspecified mood [affective] disorder: Secondary | ICD-10-CM | POA: Diagnosis not present

## 2016-11-22 DIAGNOSIS — G894 Chronic pain syndrome: Secondary | ICD-10-CM | POA: Diagnosis not present

## 2016-11-22 LAB — GLUCOSE, POCT (MANUAL RESULT ENTRY): POC GLUCOSE: 293 mg/dL — AB (ref 70–99)

## 2016-11-22 MED ORDER — INSULIN GLARGINE 100 UNITS/ML SOLOSTAR PEN: 10.0000 [IU] | PEN_INJECTOR | Freq: Every day | SUBCUTANEOUS | 11 refills | Status: DC

## 2016-11-22 MED ORDER — INSULIN PEN NEEDLE 31G X 4 MM MISC: 1.0000 [IU] | Freq: Every day | 1 refills | Status: AC

## 2016-11-22 NOTE — Progress Notes (Signed)
Subjective:    Patient ID: Kristin Mejia, female    DOB: 10/19/1970, 45 y.o.   MRN: [id number]  HPI This is a 46 yo female who presents today for additional care for her uncontrolled DM.  She was seen by me 09/08/16 with HgbA1C 13.4. She was instructed to see her endocrinologist within 2 weeks or come back to start insulin. Since I saw her last, she has not been checking her blood sugars. Has lost some weight.   Does not feel like antidepressant working very well. Has been on other things but had side effects if she forgot a dose. Had hormone levels checked 09/27/16- FSH/LH WNL (not menopausal). Does not feel sad, more mad. Easily frustrated. Doesn't want to be around her 66 yo twin daughters. Just wants to go to her room. Her daughters area in counseling and she participates. She feels that she can not go to counseling or be seen by a psychiatrist due to her line of work. 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   Does not feel motivated to make healthy choices. Is always fatigued. Struggles with her chronic pain due to hydradenitis. Reports many treatments in the past as well as multiple surgeries. Does not desire to seek additional treatment at this time. Doesn't enjoy when weather gets warmer due to increased symptoms.   Requests referral to podiatry for dysmorphic toenails. She reports that this is a chronic problem and has been causing her pain with walking which decreases her desire to be active.   Past Medical History:  Diagnosis Date  . Anxiety   . CHF (congestive heart failure) (HCC)   . Depression   . Diabetes mellitus without complication (HCC)   . Hydradenitis   . Hyperlipidemia    Past Surgical History:  Procedure Laterality Date  . CESAREAN SECTION    . FOOT SURGERY     bone removal/tendon replacement  . HYDRADENITIS EXCISION     12 x 1998/2005 on both thighs/axilla/breast  . REDUCTION MAMMAPLASTY Bilateral 1998  . STOMACH SURGERY     tummy tuck     Family History  Problem Relation Age of Onset  . Cancer Maternal Aunt        throat  . Cancer Maternal Grandfather        throat   Social History  Substance Use Topics  . Smoking status: Current Every Day Smoker    Packs/day: 1.00    Types: Cigarettes  . Smokeless tobacco: Never Used  . Alcohol use No      Review of Systems Per HPI    Objective:   Physical Exam  Constitutional: She is oriented to person, place, and time. She appears well-developed and well-nourished. No distress.  Obese.   HENT:  Head: Normocephalic and atraumatic.  Eyes: Conjunctivae are normal.  Cardiovascular: Normal rate.   Pulmonary/Chest: Effort normal.  Neurological: She is alert and oriented to person, place, and time.  Skin: She is not diaphoretic.  Vitals reviewed.     Temp 98.6 F (37 C) (Oral)   LMP 11/23/2015   SpO2 95%  Wt Readings from Last 3 Encounters:  09/08/16 217 lb 4 oz (98.5 kg)  08/06/13 228 lb (103.4 kg)  Body mass index is 39.36 kg/m.  Results for orders placed or performed in visit on 11/22/16  POC Glucose (CBG)  Result Value Ref Range   POC Glucose 293 (A) 70 - 99 mg/dl       Assessment & Plan:  1.  Type 2 diabetes mellitus with other circulatory complication, without long-term current use of insulin (HCC) - too soon to check HgbA1C - POC glucose 293 - Provided written and verbal information regarding diagnosis and treatment, including insulin injection instructions - discussed necessity of monitoring blood glucose at home and knowing numbers prior to administering insulin.  - discussed her over all health and effects of continued hyperglycemia on her energy, her mood, as well as long term health consequences.  - POC Glucose (CBG) - Ambulatory referral to Podiatry - Insulin Pen Needle 31G X 4 MM MISC; 1 Units by Does not apply route daily.  Dispense: 100 each; Refill: 1 - follow up in 2 weeks with me, she has endocrine appointment in 4 weeks  2. Toenail  deformity - Ambulatory referral to Podiatry  3. Hydradenitis - encouraged her to work toward normalizing blood sugars as well as weight loss which will help with flares - discussed some of the more recent treatment options and offered her a referral to dermatology- she declined at this time  4. Chronic pain syndrome - again, discussed her high use of opioids and long term effects on energy, mood  5. Mood disturbance (HCC) - she has been on multiple SSRIs/SNRI, is currently on lexapro 20 mg which has worked until recently, has multiple intolerances to other meds - will consult with her neurologist to see if she has suggestions.   Over 60 minutes were spent face-to-face with the patient during this encounter and >50% of that time was spent on counseling and coordination of care  Olean Reeeborah Gessner, FNP-BC  [facility] [place], [facility]  11/28/2016 3:46 PM

## 2016-11-22 NOTE — Patient Instructions (Addendum)
Check your blood sugar before your bedtime dose Start with 10 units at bedtime. If your blood sugar is over 200, add 2 more units to a max of 20 units Keep a log of your blood sugar Call or mychart message me if you have questions Follow up in 2 weeks   How and Where to Give Subcutaneous Insulin Injections, Adult People with type 1 diabetes must take insulin since their bodies do not make it. People with type 2 diabetes may require insulin. There are many different types of insulin as well as other injectable diabetes medicines that are meant to be injected into the fat layer under your skin. The type of insulin or injectable diabetes medicine you take may determine how many injections you give yourself and when to take the injections. Choosing a site for injection Insulin absorption varies from site to site. As with any injectable medication it is best for the insulin to be injected within the same body region. However, do not inject the insulin in the same spot each time. Rotating the spots you give your injections will prevent inflammation or tissue breakdown. There are four main regions that can be used for injections. The regions include the:  Abdomen (preferred region, especially for non-insulin injectable diabetes medicine).  Front and upper outer sides of thighs.  Back of upper arm.  Buttocks. Using a syringe and vial Drawing up insulin: single insulin dose   1. Wash your hands with soap and water. 2. Gently roll the insulin bottle (vial) between your hands to mix it. Do not shake the vial. 3. Clean the top rubber part of the vial with an alcohol wipe. Be sure that the plastic pop-top has been removed on newer vials. 4. Remove the plastic cover from the needle on the syringe. Do not let the needle touch anything. 5. Pull the plunger back to draw air into the syringe. The air should be the same amount as the insulin dose. 6. Push the needle through the rubber on the top of the vial.  Do not turn the vial over. 7. Push the plunger in all the way to put the air into the vial. 8. Leave the needle in the vial and turn the vial and syringe upside down. 9. Pull down slowly on the plunger, drawing the amount of insulin you need into the syringe. 10. Look for air bubbles in the syringe. You may need to push the plunger up and down 2 to 3 times to slowly get rid of any air bubbles in the syringe. 11. Pull back the plunger to get your correct dose. 12. Remove the needle from the vial. 13. Use an alcohol wipe to clean the area of the body to be injected. 14. Pinch up 1 inch of skin and hold it. 15. Put the needle straight into the skin (90-degree angle). Put the needle in as far as it will go (to the hub). The needle may need to be injected at a 45-degree angle in small adults with little fat. 16. When the needle is in, you can let go of your skin. 17. Push the plunger down all the way to inject the insulin. 18. Pull the needle straight out of the skin. 19. Press the alcohol wipe over the spot where you gave your injection. Keep it there for a few seconds. Do not rub the area. 20. Do not put the plastic cover back on the needle. Drawing up insulin: mixing 2 insulins  1. Wash your hands  with soap and water. 2. Gently roll the vial of "cloudy" insulin between your hands or rotate the vial from top to bottom to mix. 3. Clean the top of both vials with an alcohol wipe. Be sure that the plastic pop-top lid has been removed on newer vials. 4. Pull air into the syringe to equal the dose of "cloudy" insulin. 5. Stick the needle into the "cloudy" insulin vial and inject the air. Be sure to keep the vial upright. 6. Remove the needle from the "cloudy" insulin vial. 7. Pull air into the syringe to equal the dose of "clear" insulin. 8. Stick the needle into the "clear" insulin vial and inject the air. 9. Leave the needle in the "clear" insulin vial and turn the vial upside down. 10. Pull down  on the plunger and slowly draw into the syringe the number of units of "clear" insulin desired. 11. Look for air bubbles in the syringe. You may need to push the plunger up and down 2 to 3 times to slowly get rid of any air bubbles in the syringe. 12. Remove the needle from the "clear" insulin vial. 13. Stick the needle into the "cloudy" insulin vial. Do not inject any of the "clear" insulin into the "cloudy" vial. 14. Turn the "cloudy" vial upside down and pull the plunger down to the number of units that equals the total number of units of "clear" and "cloudy" insulins. 15. Remove the needle from the "cloudy" insulin vial. 16. Use an alcohol wipe to clean the area of the body to be injected. 17. Put the needle straight into the skin (90-degree angle). Put the needle in as far as it will go (to the hub). The needle may need to be injected at a 45-degree angle in small adults with little fat. 18. When the needle is in, you can let go of your skin. 19. Push the plunger down all the way to inject the insulin. 20. Pull the needle straight out of the skin. 21. Press the alcohol wipe over the spot where you gave your injection. Keep it there for a few seconds. Do not rub the area. 22. Do not put the plastic cover back on the needle. Using an insulin pen  1. Wash your hands with soap and water. 2. If you are using the "cloudy" insulin, roll the pen between your palms several times or rotate the pen top to bottom several times. 3. Remove the insulin pen cap. 4. Clean the rubber stopper of the cartridge with an alcohol wipe. 5. Remove the protective paper tab from the disposable needle. 6. Screw the needle onto the pen. 7. Remove the outer plastic needle cover. 8. Remove the inner plastic needle cover. 9. Prime the insulin pen by turning the button (dial) to 2 units. Hold the pen with the needle pointing up, and push the dial on the opposite end until a drop of insulin appears at the needle tip. If no  insulin appears, repeat this step. 10. Dial the number of units of insulin you will inject. 11. Use an alcohol wipe to clean the area of the body to be injected. 12. Pinch up 1 inch of skin and hold it. 13. Put the needle straight into the skin (90-degree angle). 14. Push the dial down to push the insulin into the fat tissue. 15. Count to 10 slowly. Then, remove the needle from the fat tissue. 16. Carefully replace the larger outer plastic needle cover over the needle and unscrew the  capped needle. Throwing away supplies  Discard used needles in a puncture proof sharps disposal container. Follow disposal regulations for the area where you live.  Vials and empty disposable pens may be thrown away in the regular trash. This information is not intended to replace advice given to you by your health care provider. Make sure you discuss any questions you have with your health care provider. Document Released: 09/18/2003 Document Revised: 12/04/2015 Document Reviewed: 12/05/2012 Elsevier Interactive Patient Education  2017 [employer].

## 2016-11-24 ENCOUNTER — Telehealth: Payer: Self-pay | Admitting: Family Medicine

## 2016-11-24 ENCOUNTER — Other Ambulatory Visit: Payer: Self-pay | Admitting: Family Medicine

## 2016-11-24 DIAGNOSIS — E1159 Type 2 diabetes mellitus with other circulatory complications: Secondary | ICD-10-CM

## 2016-11-24 MED ORDER — BASAGLAR KWIKPEN 100 UNIT/ML ~~LOC~~ SOPN: 10.0000 [IU] | PEN_INJECTOR | Freq: Every day | SUBCUTANEOUS | 1 refills | Status: DC

## 2016-11-24 NOTE — Telephone Encounter (Signed)
Please advise 

## 2016-11-24 NOTE — Telephone Encounter (Signed)
Called and spoke with patient informing her that a different insulin was sent into pharmacy. Patient verbalized understanding nothing further needed at this time.

## 2016-11-24 NOTE — Telephone Encounter (Signed)
Pharmacy called stating( insulin glargine (LANTUS) 100 unit/mL SOPN [[id number]][[id number]] ) was not covered under insurance. Asking for alternative to be sent in.

## 2016-11-24 NOTE — Telephone Encounter (Signed)
Please call patient and tell her I have sent in a different, long acting insulin pen. Please let me know if she has any problems getting it filled.

## 2016-12-08 ENCOUNTER — Ambulatory Visit: Payer: Self-pay | Admitting: Podiatry

## 2016-12-08 ENCOUNTER — Encounter: Payer: Self-pay | Admitting: Family Medicine

## 2016-12-08 ENCOUNTER — Ambulatory Visit (INDEPENDENT_AMBULATORY_CARE_PROVIDER_SITE_OTHER): Payer: Commercial Managed Care - PPO | Admitting: Family Medicine

## 2016-12-08 VITALS — BP 144/88 | HR 79 | Temp 98.3°F | Wt 223.8 lb

## 2016-12-08 DIAGNOSIS — B373 Candidiasis of vulva and vagina: Secondary | ICD-10-CM

## 2016-12-08 DIAGNOSIS — E1159 Type 2 diabetes mellitus with other circulatory complications: Secondary | ICD-10-CM | POA: Diagnosis not present

## 2016-12-08 DIAGNOSIS — B3731 Acute candidiasis of vulva and vagina: Secondary | ICD-10-CM

## 2016-12-08 MED ORDER — FLUCONAZOLE 150 MG PO TABS: 150.0000 mg | ORAL_TABLET | Freq: Once | ORAL | 3 refills | Status: AC

## 2016-12-08 NOTE — Progress Notes (Signed)
Subjective:    Patient ID: Kristin HarrisJennifer Mejia, female    DOB: 07/01/1971, 46 y.o.   MRN: [id number]  HPI This is a 46 yo female who presents today to follow up recent basilar insulin initiation. Was seen 11/22/16 and prescribed Lantus, was notified of non coverage by pharmacy and was switched to insulin glargline.   She has brought her home glucose monitoring log today, has been taking insulin for about 7 days. No problems. Blood sugars range 244-490. Has noticed some decreased numbers when walking, diet has not been good and has forgotten to take metformin at bedtime for the week. Admits to poor diet, craves sugar, eats large amount sweets (candy) after her girls go to sleep most nights. Has appointment with endocrinologist June 7.  Requests diflucan for vaginal candidiasis.    Past Medical History:  Diagnosis Date  . Anxiety   . CHF (congestive heart failure) (HCC)   . Depression   . Diabetes mellitus without complication (HCC)   . Hydradenitis   . Hyperlipidemia    Past Surgical History:  Procedure Laterality Date  . CESAREAN SECTION    . FOOT SURGERY     bone removal/tendon replacement  . HYDRADENITIS EXCISION     12 x 1998/2005 on both thighs/axilla/breast  . REDUCTION MAMMAPLASTY Bilateral 1998  . STOMACH SURGERY     tummy tuck   Family History  Problem Relation Age of Onset  . Cancer Maternal Aunt        throat  . Cancer Maternal Grandfather        throat   Social History  Substance Use Topics  . Smoking status: Current Every Day Smoker    Packs/day: 1.00    Types: Cigarettes  . Smokeless tobacco: Never Used  . Alcohol use No      Review of Systems Per HPI    Objective:   Physical Exam Physical Exam  Vitals reviewed. Constitutional: Oriented to person, place, and time. Appears well-developed and well-nourished. Obese.  HENT:  Head: Normocephalic and atraumatic.  Eyes: Conjunctivae are normal.  Neck: Normal range of motion. Neck supple.    Cardiovascular: Normal rate.   Pulmonary/Chest: Effort normal.  Musculoskeletal: Normal range of motion.  Neurological: Alert and oriented to person, place, and time.  Psychiatric: Normal mood and affect. Behavior is normal. Judgment and thought content normal.   BP (!) 144/88 (BP Location: Left Arm, Patient Position: Sitting, Cuff Size: Normal)   Pulse 79   Temp 98.3 F (36.8 C) (Oral)   Wt 223 lb 12.8 oz (101.5 kg)   SpO2 96%   BMI 39.64 kg/m   Wt Readings from Last 3 Encounters:  12/08/16 223 lb 12.8 oz (101.5 kg)  11/22/16 222 lb 3.2 oz (100.8 kg)  09/08/16 217 lb 4 oz (98.5 kg)       Assessment & Plan:  1. Type 2 diabetes mellitus with other circulatory complication, without long-term current use of insulin (HCC) - continue insulin, increase by 2 units when blood sugar greater than 200 up to 30 units - encouraged increased walking, discussed food choices and impact on blood sugars and therefore hydradenitis. - follow up as scheduled with endocrine  2. Vaginal candidiasis - reviewed role of hyperglycemia in yeast overgrowth - fluconazole (DIFLUCAN) 150 MG tablet; Take 1 tablet (150 mg total) by mouth once. Repeat if needed  Dispense: 2 tablet; Refill: 3  - follow up in 4 months  Olean Reeeborah Gessner, FNP-BC  [facility]   [facility]  12/10/2016 1:39 PM

## 2016-12-08 NOTE — Patient Instructions (Signed)
Continue to check your blood sugars before the insulin and at another random time throughout the day. Every couple of days, increase insulin by 2 units if your evening reading is over 200.   Follow up with endocrine as scheduled

## 2016-12-13 ENCOUNTER — Other Ambulatory Visit: Payer: Self-pay | Admitting: Family Medicine

## 2016-12-13 MED ORDER — GLUCOSE BLOOD VI STRP: 1.0000 | ORAL_STRIP | 5 refills | Status: AC | PRN

## 2016-12-13 NOTE — Progress Notes (Signed)
Spoke with patient who requested refill of glucometer test strips. Prescription sent to pharmacy.

## 2016-12-24 ENCOUNTER — Encounter: Payer: Self-pay | Admitting: Podiatry

## 2016-12-24 ENCOUNTER — Ambulatory Visit (INDEPENDENT_AMBULATORY_CARE_PROVIDER_SITE_OTHER): Payer: Commercial Managed Care - PPO | Admitting: Podiatry

## 2016-12-24 DIAGNOSIS — L600 Ingrowing nail: Secondary | ICD-10-CM

## 2016-12-24 NOTE — Progress Notes (Signed)
   Subjective:    Patient ID: Kristin Mejia, female    DOB: May 12, 1971, 46 y.o.   MRN: [id number]  HPI Chief Complaint  Patient presents with  . Ingrown Toenail    B/L 2nd toenails are sore for 6 months      Review of Systems  Skin: Positive for color change.  All other systems reviewed and are negative.      Objective:   Physical Exam        Assessment & Plan:

## 2016-12-24 NOTE — Patient Instructions (Signed)

## 2016-12-27 NOTE — Progress Notes (Signed)
Subjective:    Patient ID: Kristin HarrisJennifer Mejia, female   DOB: 46 y.o.   MRN: [id number]   HPI patient presents stating she's had a lot of problems with the second nails of both feet and that it's increasingly make it hard for her to walk or wear shoe gear comfortably    Review of Systems  All other systems reviewed and are negative.       Objective:  Physical Exam  Cardiovascular: Intact distal pulses.   Musculoskeletal: Normal range of motion.  Neurological: She is alert.  Skin: Skin is warm.  Nursing note and vitals reviewed.  Neurovascular status intact muscle strength adequate range of motion within normal limits with patient found to have painful second nails bilateral that are incurvated sore and very difficult to cut. She's tried wider shoes and tried to trim them herself without relief. Found have good digital perfusion well oriented 3     Assessment:    Damaged second nails bilateral with incurvation and pain     Plan:   H&P conditions reviewed and recommended removal of the nail second bilateral. Explained procedure and risk and today I infiltrated the second digit bilateral with 60 Milligan times like Marcaine mixture remove the second nails exposed matrix and applied phenol 3 applications 30 seconds followed by alcohol lavaged sterile dressing. Gave instructions on soaks and reappoint

## 2017-01-03 ENCOUNTER — Telehealth: Payer: Self-pay | Admitting: *Deleted

## 2017-01-03 ENCOUNTER — Telehealth: Payer: Self-pay | Admitting: Family Medicine

## 2017-01-03 NOTE — Telephone Encounter (Signed)
Patient needs antibiotic for infected toes from nail removal. Toes are red and painful the past 9 days. Call patient to advise. Okay to leave a detailed message on phone. Patient is requesting a call today as soon as possible.   [id number]- [place], [place] - [address] [phone] (Phone) [phone] (Fax)

## 2017-01-03 NOTE — Telephone Encounter (Signed)
Pt states had toenail procedure on 12/24/2016 and she's diabetic and thinks they are infected. I asked pt if she was still performing the soaks and she said yes, I asked the type of soak and she said just water. I told pt she should be soaking twice daily in 1/4 C epsom salt in 1 quart water, or 1 T betadine in 1 quart water, or 1/2 C white vinegar and 1 quart water and covering the area with a antibiotic ointment bandaid. I told pt I would transfer to schedulers to get her in to see the nurse to make sure the toes were okay. Transferred to schedulers.

## 2017-01-03 NOTE — Telephone Encounter (Signed)
Patient called back and states that she really would like for Debbie to call in this antibiotic. I informed patient that she would have to be seen in office. I explained that it would be best to call the podiatrist that seen her to get the antibiotic. Patient expressed frustration and states "well what is primary care for." Provided patient with name of podiatrist and informed her that if an appointment is needed give me a call back. Understanding verbalized nothing further needed at this time.

## 2017-01-03 NOTE — Telephone Encounter (Signed)
Called and left voicemail informing patient that this issue was handled by podiatry and antibiotic should be requested to that office. Explained that if she had  any question or concerns she could return my call to the office.

## 2017-01-04 ENCOUNTER — Telehealth: Payer: Self-pay | Admitting: Podiatry

## 2017-01-04 NOTE — Telephone Encounter (Signed)
Per voicemail from patient at 254pm today she wants to cancel appointment for tomorrow.   I returned call and had to leave a message for patient to call to reschedule.

## 2017-01-05 ENCOUNTER — Ambulatory Visit: Payer: Commercial Managed Care - PPO | Admitting: Podiatry

## 2017-01-22 ENCOUNTER — Other Ambulatory Visit: Payer: Self-pay | Admitting: Family Medicine

## 2017-01-22 DIAGNOSIS — E1159 Type 2 diabetes mellitus with other circulatory complications: Secondary | ICD-10-CM

## 2017-02-21 ENCOUNTER — Telehealth: Payer: Self-pay | Admitting: Family Medicine

## 2017-02-21 ENCOUNTER — Encounter: Payer: Self-pay | Admitting: Family Medicine

## 2017-02-21 ENCOUNTER — Ambulatory Visit (INDEPENDENT_AMBULATORY_CARE_PROVIDER_SITE_OTHER): Payer: Commercial Managed Care - PPO | Admitting: Family Medicine

## 2017-02-21 VITALS — BP 124/78 | HR 86 | Temp 98.5°F | Wt 226.0 lb

## 2017-02-21 DIAGNOSIS — M2550 Pain in unspecified joint: Secondary | ICD-10-CM | POA: Diagnosis not present

## 2017-02-21 DIAGNOSIS — F39 Unspecified mood [affective] disorder: Secondary | ICD-10-CM

## 2017-02-21 DIAGNOSIS — R5382 Chronic fatigue, unspecified: Secondary | ICD-10-CM | POA: Diagnosis not present

## 2017-02-21 DIAGNOSIS — L732 Hidradenitis suppurativa: Secondary | ICD-10-CM

## 2017-02-21 DIAGNOSIS — Z794 Long term (current) use of insulin: Secondary | ICD-10-CM

## 2017-02-21 DIAGNOSIS — E1159 Type 2 diabetes mellitus with other circulatory complications: Secondary | ICD-10-CM | POA: Diagnosis not present

## 2017-02-21 DIAGNOSIS — R4586 Emotional lability: Secondary | ICD-10-CM

## 2017-02-21 MED ORDER — ALUMINUM CHLORIDE 20 % EX SOLN: Freq: Every day | CUTANEOUS | 0 refills | Status: DC

## 2017-02-21 NOTE — Telephone Encounter (Signed)
Pt mother sees dr Selena Battenkim and pt would like to switch to dr kim. Can I sch?

## 2017-02-21 NOTE — Patient Instructions (Signed)
Try salicylic acid wash for affected areas- a couple of times a week

## 2017-02-21 NOTE — Telephone Encounter (Signed)
That will be fine. 

## 2017-02-21 NOTE — Progress Notes (Signed)
Subjective:    Patient ID: Kristin Mejia, female    DOB: [birth year], 46 y.o.   MRN: [id number]  HPI This is a 46 yo female who presents today with fatigue x 2 months and generalized pain x about 4-5  months.   Pain and fatigue- Feels like fatigue is worse. Sleeping from 7 pm until morning. Feels aching all over. Sometimes fell like joints are swollen, not today. Is already taking large amounts of pain medication for her hydradenitis. Pain has been managed for 10 years by pain clinic in the [place] the [place]. Most recent database query shows that she had hydrocodone- acetaminophen 10-325 #180 filled on 02/07/17, morphine sulfate 15 mg #150 on 02/07/17, she also had triazolam 0.125 mg #60 filled 01/17/17. She also takes armodafinil which is prescribed for her by her neurologist (Dr. Edythe Lynn) who also manages her OSA, for which the patient uses CPAP (good compliance). For depression she takes escitalopram 20 mg. 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 She is concerned that she will be fired.  Per gyn she is peri-menopausal. Feels like vasomotor symptoms severe. She is on provera per her gyn.   These symptoms have been occurring off and on, especially fatigue according to her record. She has been seeing Dr. Larose Kells for hypersomnolence since 04/30/2013. Had labs drawn 09/08/16 which showed negative RF, mildly elevated sedimentation rate.   Sees pain management quarterly, also sees neurology, endocrinology, cardiology, dermatology (for history of skin cancer). Was told in the past that her copious amounts of pain medication are likely contributing to her hypersomnolence. According to the patient, she has not had any recent treatment for her hydranitis. She reports that she has been through too many painful surgeries/treatments and although she continues to have pain, regular flares, she is not necessarily interested in pursing  treatment.   Diabetes mellitus type 2, uncontrolled- recently started on basalar insulin and has followed up with her endocrinologist. She reports that she is taking her insulin regularly, but is not checking her blood sugars because she can't find her glucometer charger cord. She admits to regular dietary indiscretion and states that she "just doesn't care." Has admitted to eating bags of candy after her girls go to sleep.   Depression- she reports overwhelming stress at work and at home. She states that she is unable to have individual therapy due to her clearance at work. She has gone to family therapy for her children which she found helpful but has not been in a long time.   Past Medical History:  Diagnosis Date  . Anxiety   . CHF (congestive heart failure) (HCC)   . Depression   . Diabetes mellitus without complication (HCC)   . Hydradenitis   . Hyperlipidemia    Past Surgical History:  Procedure Laterality Date  . CESAREAN SECTION    . FOOT SURGERY     bone removal/tendon replacement  . HYDRADENITIS EXCISION     12 x 1998/2005 on both thighs/axilla/breast  . REDUCTION MAMMAPLASTY Bilateral 1998  . STOMACH SURGERY     tummy tuck   Family History  Problem Relation Age of Onset  . Cancer Maternal Aunt        throat  . Cancer Maternal Grandfather        throat   Social History   Social History  . Marital status: Single    Spouse name: N/A  .  Number of children: N/A  . Years of education: N/A   Occupational History  . Not on file.   Social History Main Topics  . Smoking status: Current Every Day Smoker    Packs/day: 1.00    Types: Cigarettes  . Smokeless tobacco: Never Used  . Alcohol use No  . Drug use: No  . Sexual activity: No   Other Topics Concern  . Not on file   Social History Narrative   Works as Psychiatristcompliance officer for [employer]   Has twin daughters born 2009   Single   Enjoys reading, crafts     Review of Systems Per HPI    Objective:    Physical Exam  Constitutional: She is oriented to person, place, and time. She appears well-developed and well-nourished. No distress.  Obese.   HENT:  Head: Normocephalic and atraumatic.  Cardiovascular: Normal rate.   Pulmonary/Chest: Effort normal.  Musculoskeletal: She exhibits no edema.  Hands, wrists, elbow without edema, crepitis, discoloration, tenderness to palpation.  Some generalized tenderness of paraspinal muscles.  No LE edema.   Neurological: She is alert and oriented to person, place, and time.  Skin: Skin is warm and dry. She is not diaphoretic.  Multiple areas of scarring bilateral axilla, under both breasts, and on upper inner thighs. Few small pustules. No areas of active draining or erythema.   Psychiatric:  She became agitated and tearful during visit.   Vitals reviewed.     BP 124/78 (BP Location: Right Arm, Patient Position: Sitting, Cuff Size: Normal)   Pulse 86   Temp 98.5 F (36.9 C) (Oral)   Wt 226 lb (102.5 kg)   SpO2 96%   BMI 40.03 kg/m  Wt Readings from Last 3 Encounters:  02/21/17 226 lb (102.5 kg)  12/08/16 223 lb 12.8 oz (101.5 kg)  11/22/16 222 lb 3.2 oz (100.8 kg)       Assessment & Plan:  1. Hydradenitis  2. Mood disturbance (HCC)  3. Chronic fatigue  4. Type 2 diabetes mellitus with other circulatory complication, with long-term current use of insulin (HCC)  5. Diffuse arthralgia  6. Tobacco abuse  I had a long discussion with patient about her symptoms as well as reviewed labs performed earlier this year with negative RF/sed rate. Discussed relationship of chronic, high dose opioid pain medication to symptoms of depressed mood, decreased pain tolerance, chronic fatigue. Also discussed hydradenitis and need for chronic pain medication. Suggested she look into newer treatment of disorder since this is why she is treated with opioids in the first place. She became upset and accused me of wanting to take away her pain medication,  which I assured her was not my goal. Also discussed role of poor diet, smoking, out of control glucose, lack of exercise and stress was having on her pain and mood. Encouraged her to consider therapy, but she doesn't feel like this is currently an option. She was very upset by the end of the visit. I did not feel that she was open to what I was telling her. She called the office shortly after she left, stating she would like to change her care to Dr. Selena BattenKim.   Over 45 minutes were spent face-to-face with the patient during this encounter and >50% of that time was spent on counseling and coordination of care  Olean Reeeborah Taurean Ju, FNP-BC  [facility]  02/24/2017 9:14 PM

## 2017-02-21 NOTE — Telephone Encounter (Signed)
Please let her know thank you for the request. However, I am full right now and am not taking new patients on multiple controlled medications. Thank you.

## 2017-02-23 NOTE — Telephone Encounter (Signed)
lmom for pt to call back

## 2017-02-24 ENCOUNTER — Telehealth: Payer: Self-pay | Admitting: Family Medicine

## 2017-02-24 NOTE — Telephone Encounter (Signed)
Pt aware dr Selena Battenkim is not taking new pts at this time

## 2017-02-24 NOTE — Telephone Encounter (Signed)
FYI

## 2017-02-24 NOTE — Telephone Encounter (Signed)
Patient is scheduled to see Dr. Jimmey RalphParker tomorrow at 8:30 for patient wanting a second opinion about last visit with Debbie;aching from head to toe all the time,patient thinks it is possible fibromyalgia. Patient does not want to transfer care just stated that "something was missed."

## 2017-02-25 ENCOUNTER — Encounter: Payer: Self-pay | Admitting: Family Medicine

## 2017-02-25 ENCOUNTER — Ambulatory Visit (INDEPENDENT_AMBULATORY_CARE_PROVIDER_SITE_OTHER): Payer: Commercial Managed Care - PPO

## 2017-02-25 ENCOUNTER — Ambulatory Visit (INDEPENDENT_AMBULATORY_CARE_PROVIDER_SITE_OTHER): Payer: Commercial Managed Care - PPO | Admitting: Family Medicine

## 2017-02-25 VITALS — BP 128/84 | HR 81 | Temp 98.6°F | Wt 224.0 lb

## 2017-02-25 DIAGNOSIS — M25572 Pain in left ankle and joints of left foot: Secondary | ICD-10-CM

## 2017-02-25 DIAGNOSIS — M25512 Pain in left shoulder: Secondary | ICD-10-CM

## 2017-02-25 DIAGNOSIS — M25571 Pain in right ankle and joints of right foot: Secondary | ICD-10-CM

## 2017-02-25 DIAGNOSIS — M25561 Pain in right knee: Secondary | ICD-10-CM

## 2017-02-25 DIAGNOSIS — M25562 Pain in left knee: Secondary | ICD-10-CM | POA: Diagnosis not present

## 2017-02-25 DIAGNOSIS — M25511 Pain in right shoulder: Secondary | ICD-10-CM | POA: Diagnosis not present

## 2017-02-25 DIAGNOSIS — M2550 Pain in unspecified joint: Secondary | ICD-10-CM

## 2017-02-25 LAB — COMPREHENSIVE METABOLIC PANEL
ALBUMIN: 4 g/dL (ref 3.5–5.2)
ALK PHOS: 83 U/L (ref 39–117)
ALT: 42 U/L — AB (ref 0–35)
AST: 39 U/L — AB (ref 0–37)
BILIRUBIN TOTAL: 0.5 mg/dL (ref 0.2–1.2)
BUN: 10 mg/dL (ref 6–23)
CALCIUM: 9.6 mg/dL (ref 8.4–10.5)
CHLORIDE: 96 meq/L (ref 96–112)
CO2: 30 mEq/L (ref 19–32)
CREATININE: 0.62 mg/dL (ref 0.40–1.20)
GFR: 109.71 mL/min (ref >60.00)
Glucose, Bld: 352 mg/dL — ABNORMAL HIGH (ref 70–99)
Potassium: 3.9 mEq/L (ref 3.5–5.1)
Sodium: 136 mEq/L (ref 135–145)
TOTAL PROTEIN: 6.4 g/dL (ref 6.0–8.3)

## 2017-02-25 LAB — CBC WITH DIFFERENTIAL/PLATELET
BASOS PCT: 1 % (ref 0.0–3.0)
Basophils Absolute: 0.1 10*3/uL (ref 0.0–0.1)
EOS ABS: 0.3 10*3/uL (ref 0.0–0.7)
Eosinophils Relative: 4.3 % (ref 0.0–5.0)
HEMATOCRIT: 47.8 % — AB (ref 36.0–46.0)
HEMOGLOBIN: 15.9 g/dL — AB (ref 12.0–15.0)
LYMPHS PCT: 26.3 % (ref 12.0–46.0)
Lymphs Abs: 2 10*3/uL (ref 0.7–4.0)
MCHC: 33.3 g/dL (ref 30.0–36.0)
MCV: 95 fl (ref 78.0–100.0)
Monocytes Absolute: 0.4 10*3/uL (ref 0.1–1.0)
Monocytes Relative: 5.8 % (ref 3.0–12.0)
Neutro Abs: 4.8 10*3/uL (ref 1.4–7.7)
Neutrophils Relative %: 62.6 % (ref 43.0–77.0)
Platelets: 268 10*3/uL (ref 150.0–400.0)
RBC: 5.03 Mil/uL (ref 3.87–5.11)
RDW: 12.7 % (ref 11.5–15.5)
WBC: 7.6 10*3/uL (ref 4.0–10.5)

## 2017-02-25 LAB — SEDIMENTATION RATE: SED RATE: 40 mm/h — AB (ref 0–20)

## 2017-02-25 LAB — URIC ACID: Uric Acid, Serum: 4.9 mg/dL (ref 2.4–7.0)

## 2017-02-25 LAB — CK: Total CK: 43 U/L (ref 7–177)

## 2017-02-25 LAB — C-REACTIVE PROTEIN: CRP: 1.6 mg/dL (ref 0.5–20.0)

## 2017-02-25 LAB — TSH: TSH: 0.81 u[IU]/mL (ref 0.35–4.50)

## 2017-02-25 NOTE — Progress Notes (Signed)
    Subjective:  Kristin Mejia is a 46 y.o. female who presents today with a chief complaint of body aches  HPI:  Body Aches Symptoms started about 4-5 months ago. Pain is mostly located in her feet, ankles, knees, hips, back, elbows, hands, and shoulders. Pain is described as a sore sensation that also sometimes feels like a burning sensation. Her pain has worsened over the past several weeks. She sees pain management and has tried morphine and hydrocodone which have not significantly helped with the pain. She has not noticed anything in particular that makes the pain better or worse. Pain is constant throughout the day. No fevers or chills, though she does feel very fatigued. She has tried ibuprofen and heating pads which has not significantly helped. No weight loss. She does have occasional night sweats, which she attributes to hormonal changes related to menopause.   She is concerned that she may have fibromyalgia.   ROS: Per HPI  PMH: Smoking history reviewed. Current everyday smoker.   Objective:  Physical Exam: BP 128/84 (BP Location: Right Arm, Patient Position: Sitting, Cuff Size: Normal)   Pulse 81   Temp 98.6 F (37 C) (Oral)   Wt 224 lb (101.6 kg)   SpO2 94%   BMI 39.68 kg/m   Gen: NAD, resting comfortably CV: RRR with no murmurs appreciated Pulm: NWOB, CTAB with no crackles, wheezes, or rhonchi MSK:  -Neck: No deformities. FROM. Nontender to palpation.  -Back: No deformities. FROM. Nontender to palpation. No trigger points identified.  -Shoulders: No deformities. FROM. Normal strength with internal and external rotation bilaterally. Supraspinatus strength intact bilaterally.  -Elbows: No deformities or effusions. FROM. Normal strength bilaterally.  -Wrist/Hands: No deformities or effusions. FROM. Normal strength throughout bilaterally.  -Knees: No deformities or effusions. FROM. No crepitus noted with active ROM. No joint line tenderness.Strength 5/5 throughout.  Negative anterior and posterior drawer signs. McMurray negative bilaterally.  -Ankles/Feet: No deformities or effusions. FROM. Mild tenderness elicited with internal rotation bilaterally. Normal talar tilt otherwise. Strength 5/5 throughout.   Imagining (I have independently reviewed this films and my prelimiary findings are noted below)  Knee Plain Films Joint space appears fairly well preserved with possible slight narrowing in the medial compartment of her left knee. No gross abnormalities.   Ankle Plain Films Joint space appears preserved bilaterally. No gross abnormalities.  Assessment/Plan:  Arthralgias Very broad differential. Patient's exam is largely normal with no focal areas of elicited tenderness. Differential includes OA, RA, SLE, statin induced myopathy, inflammatory myopathy, and hypothyroidism. She may also have opioid induced hyperalgesia. Given the broad differentially, will proceed with extensive work up. Will check ANA, RF, ESR, CRP, Uric acid, TSH, and CK. Will also wait on official reads for her plain films of her knees and ankles. Recommended trying off simvastatin for 1-2 weeks to see if it helped.   Given her lack of trigger points and lack of tenderness to touch, doubt that she has fibromyalgia.   Algis Greenhouse. Jerline Pain, MD 02/25/2017 9:19 AM

## 2017-02-25 NOTE — Patient Instructions (Signed)
We will check blood work and xrays today.  Try stopping the statin for 1-2 weeks to see if it helps.  We should know the results next week.  Take care,  Dr Jimmey Ralph

## 2017-02-28 LAB — RHEUMATOID FACTOR: Rhuematoid fact SerPl-aCnc: <14 IU/mL (ref <14)

## 2017-03-01 ENCOUNTER — Telehealth: Payer: Self-pay | Admitting: Family Medicine

## 2017-03-01 LAB — ANA: Anti Nuclear Antibody(ANA): NEGATIVE

## 2017-03-01 NOTE — Telephone Encounter (Signed)
Patient requesting a call about her lab results from 02/25/17 where she saw Dr. Jimmey Ralph for an acute visit. Call patient to advise, okay to leave a detailed message on phone.

## 2017-03-01 NOTE — Telephone Encounter (Signed)
Please advise 

## 2017-03-01 NOTE — Telephone Encounter (Signed)
Pt is aware of results. 

## 2017-03-04 ENCOUNTER — Ambulatory Visit (INDEPENDENT_AMBULATORY_CARE_PROVIDER_SITE_OTHER): Payer: Commercial Managed Care - PPO | Admitting: Family Medicine

## 2017-03-04 VITALS — BP 122/80 | HR 83 | Temp 98.2°F | Wt 221.0 lb

## 2017-03-04 DIAGNOSIS — M2550 Pain in unspecified joint: Secondary | ICD-10-CM | POA: Diagnosis not present

## 2017-03-04 DIAGNOSIS — Z794 Long term (current) use of insulin: Secondary | ICD-10-CM | POA: Diagnosis not present

## 2017-03-04 DIAGNOSIS — E1159 Type 2 diabetes mellitus with other circulatory complications: Secondary | ICD-10-CM

## 2017-03-04 DIAGNOSIS — L732 Hidradenitis suppurativa: Secondary | ICD-10-CM | POA: Diagnosis not present

## 2017-03-04 DIAGNOSIS — Z720 Tobacco use: Secondary | ICD-10-CM

## 2017-03-04 MED ORDER — MELOXICAM 7.5 MG PO TABS: 7.5000 mg | ORAL_TABLET | Freq: Every day | ORAL | 0 refills | Status: DC | PRN

## 2017-03-04 NOTE — Progress Notes (Signed)
Subjective:    Patient ID: Kristin Mejia, female    DOB: 31-Mar-1971, 46 y.o.   MRN: [id number]  HPI Patient presents today for follow up of myalgias. Was seen x 2 last week (8/17 by me and 8/21 by Dr. Jimmey Ralph for a "second opinion). Has had several months of increased myalgias, has had about a week of intermittent diarrhea up to 3x/day. No blood in stool. Worse myalgias are feet, knees, left elbow, right arm. Some relief with heat and lidocaine patches and ibuprofen 800 mg. Feels burning, no weakness, no problems with ambulating. Pain intermittent, inconsistent. Feels like it is interfering with ability to do job, chores at home and sleep. She is fatigued and has been going into work late, is concerned about losing her job. Had labs drawn 03/01/17, mildly elevated sed rate to 40, otherwise negative workup. Stopped simvastatin to see if improvement, so far she doesn't think it has made a difference.    Has been smoking more. Not checking blood sugars because she lost her glucometer charging cord. Not consistently taking meds other than insulin.   Past Medical History:  Diagnosis Date  . Anxiety   . CHF (congestive heart failure) (HCC)   . Depression   . Diabetes mellitus without complication (HCC)   . Hydradenitis   . Hyperlipidemia    Past Surgical History:  Procedure Laterality Date  . CESAREAN SECTION    . FOOT SURGERY     bone removal/tendon replacement  . HYDRADENITIS EXCISION     12 x 1998/2005 on both thighs/axilla/breast  . REDUCTION MAMMAPLASTY Bilateral 1998  . STOMACH SURGERY     tummy tuck   Family History  Problem Relation Age of Onset  . Cancer Maternal Aunt        throat  . Cancer Maternal Grandfather        throat   Social History  Substance Use Topics  . Smoking status: Current Every Day Smoker    Packs/day: 1.00    Types: Cigarettes  . Smokeless tobacco: Never Used  . Alcohol use No      Review of Systems Per HPI    Objective:   Physical Exam    Constitutional: She is oriented to person, place, and time. She appears well-developed and well-nourished. No distress.  Obese, looks fatigued  HENT:  Head: Normocephalic and atraumatic.  Eyes: Conjunctivae are normal.  Cardiovascular: Normal rate.   Pulmonary/Chest: Effort normal.  Musculoskeletal:  No joint swelling in hands or elbows. Hands and elbows non tender to palpation.   Neurological: She is alert and oriented to person, place, and time.  Skin: Skin is warm and dry. She is not diaphoretic.  Psychiatric: She has a normal mood and affect. Her behavior is normal. Judgment and thought content normal.  Vitals reviewed.     BP 122/80 (BP Location: Right Arm, Patient Position: Sitting, Cuff Size: Normal)   Pulse 83   Temp 98.2 F (36.8 C) (Oral)   Wt 221 lb (100.2 kg)   SpO2 96%   BMI 39.15 kg/m  Wt Readings from Last 3 Encounters:  03/04/17 221 lb (100.2 kg)  02/25/17 224 lb (101.6 kg)  02/21/17 226 lb (102.5 kg)      Assessment & Plan:  1. Diffuse arthralgia - reviewed blood work and discussed possible causes, she will continue to hold her statin to see if there is any improvement - encouraged her to increase activity as tolerated - will try NSAID, discussed need to use as  sparingly as possible - meloxicam (MOBIC) 7.5 MG tablet; Take 1-2 tablets (7.5-15 mg total) by mouth daily as needed for pain.  Dispense: 60 tablet; Refill: 0 - she is not sure if she has ever been on duloxetine, will check with her pharmacy and consider changing her  Escitalopram.   2. Type 2 diabetes mellitus with other circulatory complication, with long-term current use of insulin (HCC) - she has not been checking her home blood sugar because of her equipment. She was given a One Touch glucometer and encouraged to check her blood sugars at home  3. Hydradenitis - continued discussion about treating what is her underlying pain source in the hopes that she would be able to decrease narcotic pain  medication  4. Tobacco abuse - encouraged her to consider smoking cessation, discussed effect of smoking on her pain receptors as well as her hydradenitis  - Over 30 minutes were spent face-to-face with the patient during this encounter and >50% of that time was spent on counseling and coordination of care  - follow up as scheduled in 3 weeks  Olean Ree, FNP-BC  [facility] [place], [facility]  03/04/2017 4:59 PM

## 2017-03-04 NOTE — Patient Instructions (Addendum)
Yoga- Youtube Adrienne for yoga  Move as much as possible  Consider cutting back or quitting smoking  I will call your pharmacy to see if you have tried Cymbalta in the past   Steps to Quit Smoking Smoking tobacco can be harmful to your health and can affect almost every organ in your body. Smoking puts you, and those around you, at risk for developing many serious chronic diseases. Quitting smoking is difficult, but it is one of the best things that you can do for your health. It is never too late to quit. What are the benefits of quitting smoking? When you quit smoking, you lower your risk of developing serious diseases and conditions, such as:  Lung cancer or lung disease, such as COPD.  Heart disease.  Stroke.  Heart attack.  Infertility.  Osteoporosis and bone fractures.  Additionally, symptoms such as coughing, wheezing, and shortness of breath may get better when you quit. You may also find that you get sick less often because your body is stronger at fighting off colds and infections. If you are pregnant, quitting smoking can help to reduce your chances of having a baby of low birth weight. How do I get ready to quit? When you decide to quit smoking, create a plan to make sure that you are successful. Before you quit:  Pick a date to quit. Set a date within the next two weeks to give you time to prepare.  Write down the reasons why you are quitting. Keep this list in places where you will see it often, such as on your bathroom mirror or in your car or wallet.  Identify the people, places, things, and activities that make you want to smoke (triggers) and avoid them. Make sure to take these actions: ? Throw away all cigarettes at home, at work, and in your car. ? Throw away smoking accessories, such as Set designer. ? Clean your car and make sure to empty the ashtray. ? Clean your home, including curtains and carpets.  Tell your family, friends, and coworkers  that you are quitting. Support from your loved ones can make quitting easier.  Talk with your health care provider about your options for quitting smoking.  Find out what treatment options are covered by your health insurance.  What strategies can I use to quit smoking? Talk with your healthcare provider about different strategies to quit smoking. Some strategies include:  Quitting smoking altogether instead of gradually lessening how much you smoke over a period of time. Research shows that quitting "cold [place]" is more successful than gradually quitting.  Attending in-person counseling to help you build problem-solving skills. You are more likely to have success in quitting if you attend several counseling sessions. Even short sessions of 10 minutes can be effective.  Finding resources and support systems that can help you to quit smoking and remain smoke-free after you quit. These resources are most helpful when you use them often. They can include: ? Online chats with a Veterinary surgeon. ? Telephone quitlines. ? Automotive engineer. ? Support groups or group counseling. ? Text messaging programs. ? Mobile phone applications.  Taking medicines to help you quit smoking. (If you are pregnant or breastfeeding, talk with your health care provider first.) Some medicines contain nicotine and some do not. Both types of medicines help with cravings, but the medicines that include nicotine help to relieve withdrawal symptoms. Your health care provider may recommend: ? Nicotine patches, gum, or lozenges. ? Nicotine inhalers  or sprays. ? Non-nicotine medicine that is taken by mouth.  Talk with your health care provider about combining strategies, such as taking medicines while you are also receiving in-person counseling. Using these two strategies together makes you more likely to succeed in quitting than if you used either strategy on its own. If you are pregnant or breastfeeding, talk with  your health care provider about finding counseling or other support strategies to quit smoking. Do not take medicine to help you quit smoking unless told to do so by your health care provider. What things can I do to make it easier to quit? Quitting smoking might feel overwhelming at first, but there is a lot that you can do to make it easier. Take these important actions:  Reach out to your family and friends and ask that they support and encourage you during this time. Call telephone quitlines, reach out to support groups, or work with a counselor for support.  Ask people who smoke to avoid smoking around you.  Avoid places that trigger you to smoke, such as bars, parties, or smoke-break areas at work.  Spend time around people who do not smoke.  Lessen stress in your life, because stress can be a smoking trigger for some people. To lessen stress, try: ? Exercising regularly. ? Deep-breathing exercises. ? Yoga. ? Meditating. ? Performing a body scan. This involves closing your eyes, scanning your body from head to toe, and noticing which parts of your body are particularly tense. Purposefully relax the muscles in those areas.  Download or purchase mobile phone or tablet apps (applications) that can help you stick to your quit plan by providing reminders, tips, and encouragement. There are many free apps, such as QuitGuide from the [employer] developer for Disease Control and Prevention). You can find other support for quitting smoking (smoking cessation) through smokefree.gov and other websites.  How will I feel when I quit smoking? Within the first 24 hours of quitting smoking, you may start to feel some withdrawal symptoms. These symptoms are usually most noticeable 2-3 days after quitting, but they usually do not last beyond 2-3 weeks. Changes or symptoms that you might experience include:  Mood swings.  Restlessness, anxiety, or irritation.  Difficulty concentrating.  Dizziness.  Strong  cravings for sugary foods in addition to nicotine.  Mild weight gain.  Constipation.  Nausea.  Coughing or a sore throat.  Changes in how your medicines work in your body.  A depressed mood.  Difficulty sleeping (insomnia).  After the first 2-3 weeks of quitting, you may start to notice more positive results, such as:  Improved sense of smell and taste.  Decreased coughing and sore throat.  Slower heart rate.  Lower blood pressure.  Clearer skin.  The ability to breathe more easily.  Fewer sick days.  Quitting smoking is very challenging for most people. Do not get discouraged if you are not successful the first time. Some people need to make many attempts to quit before they achieve long-term success. Do your best to stick to your quit plan, and talk with your health care provider if you have any questions or concerns. This information is not intended to replace advice given to you by your health care provider. Make sure you discuss any questions you have with your health care provider. Document Released: 06/22/2001 Document Revised: 02/24/2016 Document Reviewed: 11/12/2014 Elsevier Interactive Patient Education  2017 [employer].

## 2017-03-07 ENCOUNTER — Encounter: Payer: Self-pay | Admitting: Family Medicine

## 2017-03-07 ENCOUNTER — Telehealth: Payer: Self-pay | Admitting: Family Medicine

## 2017-03-07 NOTE — Telephone Encounter (Signed)
Patient just wanted to let Eunice Blase know that she has decided to go part time and get FMLA.

## 2017-03-16 ENCOUNTER — Encounter: Payer: Self-pay | Admitting: Family Medicine

## 2017-03-16 ENCOUNTER — Ambulatory Visit (INDEPENDENT_AMBULATORY_CARE_PROVIDER_SITE_OTHER): Payer: Commercial Managed Care - PPO | Admitting: Family Medicine

## 2017-03-16 VITALS — BP 140/78 | HR 99 | Temp 98.2°F | Wt 225.0 lb

## 2017-03-16 DIAGNOSIS — E1159 Type 2 diabetes mellitus with other circulatory complications: Secondary | ICD-10-CM | POA: Diagnosis not present

## 2017-03-16 DIAGNOSIS — Z794 Long term (current) use of insulin: Secondary | ICD-10-CM | POA: Diagnosis not present

## 2017-03-16 DIAGNOSIS — G894 Chronic pain syndrome: Secondary | ICD-10-CM | POA: Diagnosis not present

## 2017-03-16 DIAGNOSIS — M2550 Pain in unspecified joint: Secondary | ICD-10-CM

## 2017-03-16 MED ORDER — CELECOXIB 100 MG PO CAPS: 100.0000 mg | ORAL_CAPSULE | Freq: Two times a day (BID) | ORAL | 1 refills | Status: DC | PRN

## 2017-03-16 MED ORDER — DULOXETINE HCL 30 MG PO CPEP: ORAL_CAPSULE | ORAL | 2 refills | Status: DC

## 2017-03-16 NOTE — Patient Instructions (Signed)
Take 1/2 of escitalopram daily for 2 weeks, then stop and start duloxetine- one tablet daily for 7 days then increase to twice a day  Check your blood sugar 3-4 times a week- can you get it below 200?  :)  Follow up in 4-6 weeks- remember, [facility]  Let me know how you are doing via Mychart

## 2017-03-16 NOTE — Progress Notes (Signed)
Subjective:    Patient ID: Kristin Mejia, female    DOB: Aug 27, 1970, 46 y.o.   MRN: [id number]  HPI This is a 46 yo female who presents today for follow up of myalgias. Tried meloxicam but it seemed to cause diarrhea and nausea with some improvement of pain. She would like to try different NSAID. Has significantly decreased narcotic use by 1/2. Is working half days and noticed improvement of stress (decreased) and energy level (increased). No new hydradenitis lesions. Continues to have some difficulty with sleep related to pain. Diabetes mellitus- she has not been checking her blood sugars at home despite being given a new glucometer and supplies at last visit. She states that she just doesn't care. She has lost 4 pounds in last 2 weeks.     Past Medical History:  Diagnosis Date  . Anxiety   . CHF (congestive heart failure) (HCC)   . Depression   . Diabetes mellitus without complication (HCC)   . Hydradenitis   . Hyperlipidemia    Past Surgical History:  Procedure Laterality Date  . CESAREAN SECTION    . FOOT SURGERY     bone removal/tendon replacement  . HYDRADENITIS EXCISION     12 x 1998/2005 on both thighs/axilla/breast  . REDUCTION MAMMAPLASTY Bilateral 1998  . STOMACH SURGERY     tummy tuck   Family History  Problem Relation Age of Onset  . Cancer Maternal Aunt        throat  . Cancer Maternal Grandfather        throat   Social History  Substance Use Topics  . Smoking status: Current Every Day Smoker    Packs/day: 1.00    Types: Cigarettes  . Smokeless tobacco: Never Used  . Alcohol use No      Review of Systems Per HPI    Objective:   Physical Exam  Constitutional: She is oriented to person, place, and time. She appears well-developed and well-nourished. No distress.  HENT:  Head: Normocephalic and atraumatic.  Eyes: Conjunctivae are normal.  Cardiovascular: Normal rate.   Pulmonary/Chest: Effort normal.  Neurological: She is alert and oriented  to person, place, and time.  Skin: Skin is warm. She is not diaphoretic.  Psychiatric: She has a normal mood and affect. Her behavior is normal. Judgment and thought content normal.  Mood brighter today, appears more relaxed.   Vitals reviewed.     BP 140/78 (BP Location: Right Arm, Patient Position: Sitting, Cuff Size: Large)   Pulse 99   Temp 98.2 F (36.8 C) (Oral)   Wt 225 lb (102.1 kg)   SpO2 97%   BMI 39.86 kg/m  Wt Readings from Last 3 Encounters:  03/16/17 225 lb (102.1 kg)  03/04/17 221 lb (100.2 kg)  02/25/17 224 lb (101.6 kg)       Assessment & Plan:  1. Diffuse arthralgia - discussed weaning escitalopram and starting duloxetine. Provided written and verbal instructions - will try celecoxib and stop meloxicam. Discussed using on PRN basis, as little as possible, discussed potential side effects - DULoxetine (CYMBALTA) 30 MG capsule; Take one tablet daily x 7 days, then increase to one tablet twice a day.  Dispense: 60 capsule; Refill: 2 - celecoxib (CELEBREX) 100 MG capsule; Take 1 capsule (100 mg total) by mouth 2 (two) times daily as needed.  Dispense: 60 capsule; Refill: 1  2. Chronic pain syndrome - DULoxetine (CYMBALTA) 30 MG capsule; Take one tablet daily x 7 days, then increase to one  tablet twice a day.  Dispense: 60 capsule; Refill: 2  3. Type 2 diabetes mellitus with other circulatory complication, with long-term current use of insulin (HCC) - encouraged at least periodic blood sugar monitoring and discussed likely improvement in symptoms with improved blood sugar control  - follow up in 4-6 weeks   Olean Reeeborah Dawaun Brancato, FNP-BC  [facility] [place], [facility]  03/21/2017 8:45 AM

## 2017-03-24 ENCOUNTER — Encounter: Payer: Self-pay | Admitting: Family Medicine

## 2017-04-06 ENCOUNTER — Encounter: Payer: Self-pay | Admitting: Family Medicine

## 2017-04-06 NOTE — Telephone Encounter (Signed)
Patient is still having very intense pains from her fibromyalgia and needs her FMLA extended. She is unable to work on Monday due to the pains. Please extend from 04/11/17-05/06/17. Call patient to advise if there are any further questions.

## 2017-04-07 ENCOUNTER — Telehealth: Payer: Self-pay | Admitting: Emergency Medicine

## 2017-04-07 NOTE — Telephone Encounter (Signed)
Follow ing up on Mychart message. Called and left voicemail for pt to return call to office. If patients pain is that intense we should get her in to see another provider in Debbie's absence. Awaiting patients return call.

## 2017-04-11 ENCOUNTER — Ambulatory Visit (INDEPENDENT_AMBULATORY_CARE_PROVIDER_SITE_OTHER): Payer: Commercial Managed Care - PPO | Admitting: Family Medicine

## 2017-04-11 ENCOUNTER — Encounter: Payer: Self-pay | Admitting: Family Medicine

## 2017-04-11 ENCOUNTER — Ambulatory Visit: Payer: Commercial Managed Care - PPO | Admitting: Family Medicine

## 2017-04-11 VITALS — BP 128/74 | HR 87 | Temp 98.7°F | Wt 221.4 lb

## 2017-04-11 DIAGNOSIS — R112 Nausea with vomiting, unspecified: Secondary | ICD-10-CM

## 2017-04-11 DIAGNOSIS — M797 Fibromyalgia: Secondary | ICD-10-CM

## 2017-04-11 DIAGNOSIS — M7918 Myalgia, other site: Secondary | ICD-10-CM | POA: Insufficient documentation

## 2017-04-11 DIAGNOSIS — R110 Nausea: Secondary | ICD-10-CM | POA: Diagnosis not present

## 2017-04-11 LAB — COMPREHENSIVE METABOLIC PANEL
ALBUMIN: 4.4 g/dL (ref 3.5–5.2)
ALT: 36 U/L — ABNORMAL HIGH (ref 0–35)
AST: 26 U/L (ref 0–37)
Alkaline Phosphatase: 82 U/L (ref 39–117)
BUN: 13 mg/dL (ref 6–23)
CALCIUM: 9.8 mg/dL (ref 8.4–10.5)
CHLORIDE: 89 meq/L — AB (ref 96–112)
CO2: 29 mEq/L (ref 19–32)
Creatinine, Ser: 0.76 mg/dL (ref 0.40–1.20)
GFR: 86.69 mL/min (ref >60.00)
Glucose, Bld: 370 mg/dL — ABNORMAL HIGH (ref 70–99)
POTASSIUM: 4.1 meq/L (ref 3.5–5.1)
SODIUM: 130 meq/L — AB (ref 135–145)
Total Bilirubin: 0.8 mg/dL (ref 0.2–1.2)
Total Protein: 6.9 g/dL (ref 6.0–8.3)

## 2017-04-11 LAB — LIPASE: LIPASE: 42 U/L (ref 11.0–59.0)

## 2017-04-11 LAB — CBC
HEMATOCRIT: 45.5 % (ref 36.0–46.0)
Hemoglobin: 15.4 g/dL — ABNORMAL HIGH (ref 12.0–15.0)
MCHC: 33.9 g/dL (ref 30.0–36.0)
MCV: 92.8 fl (ref 78.0–100.0)
PLATELETS: 265 10*3/uL (ref 150.0–400.0)
RBC: 4.9 Mil/uL (ref 3.87–5.11)
RDW: 12 % (ref 11.5–15.5)
WBC: 9.3 10*3/uL (ref 4.0–10.5)

## 2017-04-11 LAB — H. PYLORI ANTIBODY, IGG: H PYLORI IGG: NEGATIVE

## 2017-04-11 MED ORDER — PROMETHAZINE HCL 25 MG PO TABS: 25.0000 mg | ORAL_TABLET | Freq: Three times a day (TID) | ORAL | 3 refills | Status: AC | PRN

## 2017-04-11 NOTE — Assessment & Plan Note (Signed)
Unclear etiology. Patient reports a diagnosis of fibromyalgia. She has had negative workup in the past including normal CBC, RF, ANA, TSH, and CMET. Her ESR is mildly elevated to 40. Given that her symptoms have been very difficult to control on several medication classes including narcotics and NSAIDs, will place referral to rheumatology for further investigation. Patient has been intolerant of Lyrica in the past. She has been prescribed Cymbalta, but has not yet started it.

## 2017-04-11 NOTE — Patient Instructions (Signed)
We will check blood work.  I will also send in a referral to rheumatology.  Come back after your tests are completed.  Take care,  Dr Jimmey Ralph

## 2017-04-11 NOTE — Assessment & Plan Note (Signed)
Concern for gastroparesis given her history of uncontrolled type 2 diabetes. We'll obtain gastric emptying study to rule out. No red flag signs or symptoms. Will also check H. pylori, lipase, CMET, CBC to rule out other causes. Continue Phenergan for now.

## 2017-04-11 NOTE — Progress Notes (Signed)
    Subjective:  Kristin Mejia is a 46 y.o. female who presents today with a chief complaint of fibromyalgia.   HPI:  Fibromyalgia, Chronic Problem, worsening Started meloxicam about a month ago, which did not help. She has not yet started cymbalta. She has also tried Celebrex, which did not work and caused her to have some abdominal pain and nausea. She has been Intolerant of lyrica in the past. Norco tablets do not help very much. Pain is so severe that is negatively affecting patient's quality of life and ability to perform her activities of daily living.  Nausea, Chronic Problem, worsening She currently takes phenergan everyday, however it is no longer working as well. Taking phenergan three times daily. No vomiting or regurgitation. No weight loss. No abdominal pain. Occasional abdominal bloating.  ROS: Per HPI  PMH: Smoking history reviewed. Current smoker.   Objective:  Physical Exam: BP 128/74   Pulse 87   Temp 98.7 F (37.1 C) (Oral)   Wt 221 lb 6 oz (100.4 kg)   LMP 11/23/2015   SpO2 95%   BMI 39.21 kg/m   Gen: NAD, resting comfortably CV: RRR with no murmurs appreciated Pulm: NWOB, CTAB with no crackles, wheezes, or rhonchi GI: Obese, Normal bowel sounds present. Soft, Nontender, Nondistended.  Assessment/Plan:  Musculoskeletal pain Unclear etiology. Patient reports a diagnosis of fibromyalgia. She has had negative workup in the past including normal CBC, RF, ANA, TSH, and CMET. Her ESR is mildly elevated to 40. Given that her symptoms have been very difficult to control on several medication classes including narcotics and NSAIDs, will place referral to rheumatology for further investigation. Patient has been intolerant of Lyrica in the past. She has been prescribed Cymbalta, but has not yet started it.  Nausea Concern for gastroparesis given her history of uncontrolled type 2 diabetes. We'll obtain gastric emptying study to rule out. No red flag signs or  symptoms. Will also check H. pylori, lipase, CMET, CBC to rule out other causes. Continue Phenergan for now.   Algis Greenhouse. Jerline Pain, MD 04/11/2017 2:05 PM

## 2017-04-21 ENCOUNTER — Telehealth: Payer: Self-pay

## 2017-04-21 NOTE — Telephone Encounter (Signed)
I can complete it but we may have to have her come back in for a visit to complete it accurately and completely.   Kristin Mejia. Jimmey Ralph, MD 04/21/2017 2:12 PM

## 2017-04-21 NOTE — Telephone Encounter (Signed)
Received a Fax with Rheumatology appt information. Pt is aware of the appt.  Please see MyChart telephone note from 04/06/2017 about FMLA paperwork. They have not received paperwork from [facility] and by telephone encounter it does not look like she is going to complete. She is having employer fax information, will you complete for her?

## 2017-04-22 ENCOUNTER — Telehealth: Payer: Self-pay

## 2017-04-22 ENCOUNTER — Other Ambulatory Visit: Payer: Self-pay | Admitting: Family Medicine

## 2017-04-22 DIAGNOSIS — E871 Hypo-osmolality and hyponatremia: Secondary | ICD-10-CM

## 2017-04-22 NOTE — Telephone Encounter (Signed)
Have not recieved paperwork yet.

## 2017-04-22 NOTE — Telephone Encounter (Signed)
Pt coming for labs 04/25/17. Please place future orders. Thank you.

## 2017-04-22 NOTE — Telephone Encounter (Signed)
Labs placed.  Katina Degree. Jimmey Ralph, MD 04/22/2017 2:16 PM

## 2017-04-25 ENCOUNTER — Other Ambulatory Visit (INDEPENDENT_AMBULATORY_CARE_PROVIDER_SITE_OTHER): Payer: Commercial Managed Care - PPO

## 2017-04-25 DIAGNOSIS — E871 Hypo-osmolality and hyponatremia: Secondary | ICD-10-CM

## 2017-04-25 LAB — BASIC METABOLIC PANEL
BUN: 7 mg/dL (ref 6–23)
CALCIUM: 9.1 mg/dL (ref 8.4–10.5)
CO2: 29 meq/L (ref 19–32)
Chloride: 94 mEq/L — ABNORMAL LOW (ref 96–112)
Creatinine, Ser: 0.74 mg/dL (ref 0.40–1.20)
GFR: 89.38 mL/min (ref >60.00)
Glucose, Bld: 495 mg/dL — ABNORMAL HIGH (ref 70–99)
Potassium: 3.6 mEq/L (ref 3.5–5.1)
SODIUM: 133 meq/L — AB (ref 135–145)

## 2017-04-26 ENCOUNTER — Telehealth: Payer: Self-pay | Admitting: Family Medicine

## 2017-04-26 NOTE — Telephone Encounter (Signed)
Joy with [employer] calling to get last office note, labs, and med list faxed to number [phone]. Patient is currently there and they need this faxed ASAP. Please advise.

## 2017-04-26 NOTE — Telephone Encounter (Signed)
Information Faxed

## 2017-04-29 NOTE — Telephone Encounter (Signed)
Patient came in office asking about notes below. Informed patient we had not received paperwork via fax. Patient is calling to have paperwork sent again addressed to Dr. Jimmey RalphParker

## 2017-05-03 ENCOUNTER — Telehealth: Payer: Self-pay | Admitting: Family Medicine

## 2017-05-03 NOTE — Telephone Encounter (Signed)
See other basket message. 

## 2017-05-03 NOTE — Telephone Encounter (Signed)
Paper work has not yet been received

## 2017-05-03 NOTE — Telephone Encounter (Signed)
Patient advised that she received the mychart message from Dr. Jimmey RalphParker and she stated that the forms will not be provided due to the deadline being missed however appreciated him responding so quickly. Patient advised that she located the email with her FMLA paperwork and would bring it to the office tomorrow for Dr. Jimmey RalphParker to fill out and process.   Patient stated that it was important that a note be included with the paperwork explaining that it was our error that the paperwork was not submitted in a timely manner and to approve the FMLA.    Awaiting patient FMLA paperwork.

## 2017-05-03 NOTE — Telephone Encounter (Signed)
Patient called in asking about the status of the records that should have been sent from [employer] for her F[place]. After checking in the patient's chart I did see that 03/24/17 the records were stated to have been faxed over by Kristin Sprangebbie Gessner, NP the previous provider. The patient stated that she has been contacted by HR and notified that she is about to lose her job due to the back and forth with faxing the records or our office receiving the records.   I advised the patient that I would discuss this wekk Kristin Mejia, whom she transferred to and see what we could do to help.   After speaking to Kristin Mejia, he agreed to writing a note to the patient's job so that she would not lose her job.   I called patient to notify and she advised that she was grateful however, he would have to call [phone] and ask for the HR Rep in charge of her case, Kristin Mejia. Patient advised that her supervisor Kristin Mejia is on a business trip until Friday 05/06/17. Insurance will not send the FMLA forms any longer due to being past the date of completion.  Please call the number provided as soon as possible to help the patient due to the confusion being from the transfer of providers.

## 2017-05-03 NOTE — Telephone Encounter (Signed)
Patient called in asking about records that should have been sent from [place]. I checked the office and there are no records. I asked Dr. Jimmey RalphParker if he had received them. He stated that he needed medical records from her previous provider and to see her. I explained this to the patient. She states that she has already seen Dr. Jimmey RalphParker and that the office has faxed the records 4-5 times. I asked her to have them sent again to my attention. She explained that she is in danger of being terminated by her employer. I explained that Dr. Jimmey RalphParker would need those records before completing her forms. Patient verbalized understanding.

## 2017-05-04 NOTE — Telephone Encounter (Signed)
Spoke with patient this morning, her employer will no longer accept her FMLA paperwork. She will be finding out details on what information we can provide her supervisor in order for her to go back to work. Will wait for phone call.

## 2017-05-05 NOTE — Telephone Encounter (Signed)
Patient walked in and dropped off FMLA paperwork. Placed in Autumn's desk w/ sticky note for Dr. Jimmey RalphParker.  Diannia RuderKara informed me that the forms were to be filled out and faxed immediately by Jimmey RalphParker.  Ty,  -LL

## 2017-05-05 NOTE — Telephone Encounter (Signed)
I have placed this on your desk to review.

## 2017-05-09 NOTE — Telephone Encounter (Signed)
Spoke with patient. Information faxed, copy mailed to patient, and a copy has been mailed to chart.

## 2017-05-09 NOTE — Telephone Encounter (Signed)
Form was completed as much as possible. There are still sections that we need her input to fill out accurately, specifically her job requirements and her limitations. Patient was called by Autumn to inform her of this but we have not received a response as of yet.   [name], MD 05/09/2017 8:26 AM

## 2017-05-10 NOTE — Telephone Encounter (Signed)
Faxed information updated information.

## 2017-05-10 NOTE — Telephone Encounter (Signed)
Form edited, signed, and given to Autumn.  [name], MD 05/10/2017 2:29 PM

## 2017-05-10 NOTE — Telephone Encounter (Signed)
Paperwork is on your desk for editing purposes. Let me know if I need to call her.

## 2017-05-10 NOTE — Telephone Encounter (Signed)
[facility].   Page 1 of the Certification Form needs a clear reason as to why the patient needs time away from work. List a condition/ailment.  Also needs Parkers date and initial next to where he adds the patients condition.  Ty,  -LL

## 2017-05-12 NOTE — Telephone Encounter (Signed)
Patient says her dates of being out of work was supposed to be from 04/09/2017 to 05/06/2017  HR called her b/c Jimmey Ralpharker extended her time off to cover 05/12/2017 to April 30th 2019.   Thank you,  -LL

## 2017-05-13 NOTE — Telephone Encounter (Signed)
Can you reach out to this patient and schedule her an appt to go over this Paperwork?

## 2017-05-19 NOTE — Telephone Encounter (Signed)
The patient has been scheduled for Monday 11/12 at 4:20pm.  Ty,  -LL

## 2017-05-20 NOTE — Telephone Encounter (Signed)
FYI

## 2017-05-23 ENCOUNTER — Ambulatory Visit: Payer: Commercial Managed Care - PPO | Admitting: Family Medicine

## 2017-05-23 ENCOUNTER — Encounter: Payer: Self-pay | Admitting: Family Medicine

## 2017-05-23 DIAGNOSIS — M7918 Myalgia, other site: Secondary | ICD-10-CM

## 2017-05-23 NOTE — Assessment & Plan Note (Signed)
Stable. FMLA completed. Defer further management to rheumatology and pain clinic.

## 2017-05-23 NOTE — Progress Notes (Signed)
    Subjective:  Kristin HarrisJennifer Mejia is a 46 y.o. female who presents today with a chief complaint of FMLA paperwork for fibromyalgia  HPI:  Fibromyalgia, Chronic Problem. Patient has been seen for this numerous times over the past few months. Was referred to rheumatology, whom she has seen once. Reports that she had blood work done that was all normal. She has been seen in the pain clinic and started on a new medication that helps with her symptoms.  She requests that her FMLA paperwork be updated to indicate that she worked part time from 04/06/17 to 05/06/2017. She will be following up with rheumatology.   ROS: No fevers.   Objective:  Physical Exam: BP 100/70   Pulse 78   Ht 5\' 3"  (1.6 m)   Wt 228 lb (103.4 kg)   LMP 11/23/2015   SpO2 98%   BMI 40.39 kg/m   Gen: NAD, resting comfortably  Assessment/Plan:  Musculoskeletal pain Stable. FMLA completed. Defer further management to rheumatology and pain clinic.   Advised patient to follow up soon for visit regarding diabetes, HLD, HTN, etc.   Caleb M. Jimmey RalphParker, MD 05/23/2017 4:40 PM

## 2017-05-24 NOTE — Telephone Encounter (Signed)
Not sure if patient still needs refill. Please Advise.

## 2017-06-15 ENCOUNTER — Other Ambulatory Visit: Payer: Self-pay

## 2017-06-15 DIAGNOSIS — E1159 Type 2 diabetes mellitus with other circulatory complications: Secondary | ICD-10-CM

## 2017-06-15 MED ORDER — BASAGLAR KWIKPEN 100 UNIT/ML ~~LOC~~ SOPN: PEN_INJECTOR | SUBCUTANEOUS | 1 refills | Status: DC

## 2017-06-16 ENCOUNTER — Ambulatory Visit (INDEPENDENT_AMBULATORY_CARE_PROVIDER_SITE_OTHER): Payer: Commercial Managed Care - PPO | Admitting: Family Medicine

## 2017-06-16 ENCOUNTER — Encounter: Payer: Self-pay | Admitting: Family Medicine

## 2017-06-16 VITALS — BP 121/84 | HR 87 | Ht 63.0 in | Wt 223.6 lb

## 2017-06-16 DIAGNOSIS — E1159 Type 2 diabetes mellitus with other circulatory complications: Secondary | ICD-10-CM | POA: Diagnosis not present

## 2017-06-16 DIAGNOSIS — E785 Hyperlipidemia, unspecified: Secondary | ICD-10-CM | POA: Diagnosis not present

## 2017-06-16 DIAGNOSIS — E1165 Type 2 diabetes mellitus with hyperglycemia: Secondary | ICD-10-CM | POA: Diagnosis not present

## 2017-06-16 DIAGNOSIS — H00014 Hordeolum externum left upper eyelid: Secondary | ICD-10-CM | POA: Diagnosis not present

## 2017-06-16 DIAGNOSIS — I152 Hypertension secondary to endocrine disorders: Secondary | ICD-10-CM

## 2017-06-16 DIAGNOSIS — E1169 Type 2 diabetes mellitus with other specified complication: Secondary | ICD-10-CM | POA: Diagnosis not present

## 2017-06-16 DIAGNOSIS — I10 Essential (primary) hypertension: Secondary | ICD-10-CM

## 2017-06-16 DIAGNOSIS — E118 Type 2 diabetes mellitus with unspecified complications: Secondary | ICD-10-CM | POA: Diagnosis not present

## 2017-06-16 DIAGNOSIS — IMO0002 Reserved for concepts with insufficient information to code with codable children: Secondary | ICD-10-CM

## 2017-06-16 NOTE — Patient Instructions (Signed)

## 2017-06-16 NOTE — Assessment & Plan Note (Signed)
Last LDL of 116 approximately 9 months ago.  Continue simvastatin 40 mg daily.  We will recheck her lipid panel in approximately 3 months.

## 2017-06-16 NOTE — Assessment & Plan Note (Signed)
At goal today.  Continue current regimen of hydrochlorothiazide, Imdur, and metoprolol.

## 2017-06-16 NOTE — Assessment & Plan Note (Signed)
Last A1c significantly elevated to 13.2, however this was approximately 9 months ago.  Per patient she is being managed by her endocrinologist.  We will obtain these records.

## 2017-06-16 NOTE — Progress Notes (Signed)
    Subjective:  Kristin HarrisJennifer Rothe is a 46 y.o. female who presents today with a chief complaint of left eye swelling.   HPI:  Left Eye Swelling Started yesterday.  Stable since then.  Associated symptoms include mild watery eye and blurred vision.  No pain to the area.  No purulent drainage.  No fevers or chills.  No sick contacts.  No treatments tried.  Concerned that it may be a stye.  No other obvious alleviating or aggravating factors.  Hypertension, established problem, Stable BP Readings from Last 3 Encounters:  06/16/17 121/84  05/23/17 100/70  04/11/17 128/74   Current Medications: Hydrochlorothiazide 25 mg daily, Imdur 30 mg daily, metoprolol succinate 100 mg daily, compliant without side effects.  ROS: Denies any chest pain, shortness of breath, dyspnea on exertion, leg edema.   Type 2 diabetes, established problem, stable Sees endocrinology for this.  Will be seeing them soon.  She is currently on insulin and says her sugars have been running a little high but doing better.  ROS: Per HPI  PMH: Smoking history reviewed.  Current smoker.  Objective:  Physical Exam: BP 121/84   Pulse 87   Ht 5\' 3"  (1.6 m)   Wt 223 lb 9.6 oz (101.4 kg)   LMP 11/23/2015   SpO2 95%   BMI 39.61 kg/m   Gen: NAD, resting comfortably HEENT: Left upper eyelid swollen and erythematous.  Stye noted on inner aspect of left upper eyelid.  Extraocular eye movements intact without pain.  Visual acuity grossly intact. CV: RRR with no murmurs appreciated Pulm: NWOB, CTAB with no crackles, wheezes, or rhonchi  Assessment/Plan:  Hypertension associated with diabetes (HCC) At goal today.  Continue current regimen of hydrochlorothiazide, Imdur, and metoprolol.  Diabetes mellitus type 2, uncontrolled, with complications (HCC) Last A1c significantly elevated to 13.2, however this was approximately 9 months ago.  Per patient she is being managed by her endocrinologist.  We will obtain these  records.  Hyperlipidemia associated with type 2 diabetes mellitus (HCC) Last LDL of 116 approximately 9 months ago.  Continue simvastatin 40 mg daily.  We will recheck her lipid panel in approximately 3 months.  Stye No signs of infection.  Reassured patient.  Recommended warm compresses 3-4 times a day.  Discussed reasons to return to care including failure to improve within 5-7 days, development of severe pain, decreased visual acuity, fever, or purulent drainage.  [name], MD 06/16/2017 11:20 AM

## 2017-07-22 ENCOUNTER — Encounter: Payer: Self-pay | Admitting: Family Medicine

## 2017-07-22 ENCOUNTER — Ambulatory Visit: Payer: Commercial Managed Care - PPO | Admitting: Family Medicine

## 2017-07-22 VITALS — BP 124/82 | HR 92 | Temp 98.3°F | Ht 63.0 in | Wt 219.4 lb

## 2017-07-22 DIAGNOSIS — L0291 Cutaneous abscess, unspecified: Secondary | ICD-10-CM | POA: Diagnosis not present

## 2017-07-22 MED ORDER — DOXYCYCLINE HYCLATE 100 MG PO TABS: 100.0000 mg | ORAL_TABLET | Freq: Two times a day (BID) | ORAL | 0 refills | Status: DC

## 2017-07-22 NOTE — Patient Instructions (Signed)
Incision and Drainage, Care After Refer to this sheet in the next few weeks. These instructions provide you with information about caring for yourself after your procedure. Your health care provider may also give you more specific instructions. Your treatment has been planned according to current medical practices, but problems sometimes occur. Call your health care provider if you have any problems or questions after your procedure. What can I expect after the procedure? After the procedure, it is common to have:  Pain or discomfort around your incision site.  Drainage from your incision.  Follow these instructions at home:  Take over-the-counter and prescription medicines only as told by your health care provider.  If you were prescribed an antibiotic medicine, take it as told by your health care provider.Do not stop taking the antibiotic even if you start to feel better.  Followinstructions from your health care provider about: ? How to take care of your incision. ? When and how you should change your packing and bandage (dressing). Wash your hands with soap and water before you change your dressing. If soap and water are not available, use hand sanitizer. ? When you should remove your dressing.  Do not take baths, swim, or use a hot tub until your health care provider approves.  Keep all follow-up visits as told by your health care provider. This is important.  Check your incision area every day for signs of infection. Check for: ? More redness, swelling, or pain. ? More fluid or blood. ? Warmth. ? Pus or a bad smell. Contact a health care provider if:  Your cyst or abscess returns.  You have a fever.  You have more redness, swelling, or pain around your incision.  You have more fluid or blood coming from your incision.  Your incision feels warm to the touch.  You have pus or a bad smell coming from your incision. Get help right away if:  You have severe pain or  bleeding.  You cannot eat or drink without vomiting.  You have decreased urine output.  You become short of breath.  You have chest pain.  You cough up blood.  The area where the incision and drainage occurred becomes numb or it tingles. This information is not intended to replace advice given to you by your health care provider. Make sure you discuss any questions you have with your health care provider. Document Released: 09/20/2011 Document Revised: 11/28/2015 Document Reviewed: 04/18/2015 Elsevier Interactive Patient Education  2018 [employer] Inc[employer]  

## 2017-07-22 NOTE — Progress Notes (Signed)
   Subjective:  Kristin HarrisJennifer Lack is a 47 y.o. female who presents today with a chief complaint of abscess.   HPI:  Abscesses, acute issues Patient with 3 separate lesions. 1.  Lesion on left chest.  Has been present for months.  Some swelling and pain to the area.  No drainage.  No home treatments tried. Worsened recently. 2.  Lesion on left thigh.  Also present for months.  No drainage.  No home treatments tried. Stable.  3.  Lesion in left.  Present for several months.  Has had drainage in the past however none recently. Worsened recently.  Denies any fevers or chills. Symptoms worsened recently due to her increased sweating at night due to her menopause.   ROS: Per HPI  PMH: She reports that she has been smoking cigarettes.  She has been smoking about 1.00 pack per day. she has never used smokeless tobacco. She reports that she does not drink alcohol or use drugs.  Objective:  Physical Exam: BP 124/82 (BP Location: Left Arm, Patient Position: Sitting, Cuff Size: Normal)   Pulse 92   Temp 98.3 F (36.8 C) (Oral)   Ht 5\' 3"  (1.6 m)   Wt 219 lb 6.4 oz (99.5 kg)   LMP 11/23/2015   SpO2 96%   BMI 38.86 kg/m   Gen: NAD, resting comfortably Skin: 2 x 4 cm area of erythema and induration just inferior to left breast.  1 x 2 cm area of erythema induration on the left inner thigh.  3 x 4 cm area  of erythema induration on left buttocks.  Incision and Drainage Procedure Note  Pre-operative Diagnosis: Abscess  Post-operative Diagnosis: same  Indications: Therapeutiic  Anesthesia: 1% plain lidocaine  Procedure Details  The procedure, risks and complications have been discussed in detail (including, but not limited to airway compromise, infection, bleeding) with the patient, and the patient has signed consent to the procedure.  The skin was sterilely prepped and draped over the affected area in the usual fashion. After adequate local anesthesia, I&D with a #11 blade was performed  on the three lesions noted above: 1: the left chest wall, the left inner thigh, and the left buttocks.  Purulent drainage was noted at all 3 areas.  There is were left open to drain.  Gauze was applied.  Findings: Abscess  EBL: 20 cc's  Condition: Tolerated procedure well  Complications: none.  Assessment/Plan:  Abscesses I&D performed today x3.  See above procedure note.  Given the extensive nature of her abscesses, we will start oral doxycycline as well.  Return precautions discussed.  Follow-up as needed.  If continues to have issues with abscesses and hidradenitis, will consider surgical referral.  [name], MD 07/22/2017 3:47 PM

## 2017-08-14 ENCOUNTER — Other Ambulatory Visit: Payer: Self-pay | Admitting: Family Medicine

## 2017-08-22 ENCOUNTER — Other Ambulatory Visit: Payer: Self-pay | Admitting: Family Medicine

## 2017-08-22 DIAGNOSIS — E1159 Type 2 diabetes mellitus with other circulatory complications: Secondary | ICD-10-CM

## 2017-08-24 ENCOUNTER — Encounter: Payer: Self-pay | Admitting: Family Medicine

## 2017-08-24 ENCOUNTER — Ambulatory Visit: Payer: Commercial Managed Care - PPO | Admitting: Family Medicine

## 2017-08-24 VITALS — BP 120/78 | HR 80 | Temp 98.5°F | Ht 63.0 in | Wt 222.4 lb

## 2017-08-24 DIAGNOSIS — L732 Hidradenitis suppurativa: Secondary | ICD-10-CM

## 2017-08-24 DIAGNOSIS — R609 Edema, unspecified: Secondary | ICD-10-CM

## 2017-08-24 DIAGNOSIS — L570 Actinic keratosis: Secondary | ICD-10-CM

## 2017-08-24 DIAGNOSIS — R600 Localized edema: Secondary | ICD-10-CM

## 2017-08-24 MED ORDER — DOXYCYCLINE HYCLATE 100 MG PO TABS: 100.0000 mg | ORAL_TABLET | Freq: Two times a day (BID) | ORAL | 0 refills | Status: DC

## 2017-08-24 NOTE — Progress Notes (Signed)
Subjective:  Kristin Mejia is a 47 y.o. female who presents today for same-day appointment with a chief complaint of left sided facial swelling.   HPI:  Left Sided Facial Swelling, Acute Issue Symptoms started yesterday.  Have improved since this morning.  No obvious precipitating events.  She has not tried any treatments for this.  No fevers or chills.  She has had 2 similar episodes in the past she was diagnosed with a "clogged salivary gland".  These resolve spontaneously.  No pain to the area.  No fluctuance.  No erythema.  Abscess, acute issue Started 2 days ago.  Started draining spontaneously yesterday.  She is concerned because the abscess appears very deep and wide.  No fevers.  No spreading erythema.  No home treatments tried.  Skin lesions, new issues Patient has 2 separate lesions.  One lesion she noted on her right face just below her eye about a month ago.  Has not changed significantly over that time.  Patient has a history of squamous cell carcinoma and is concerned that it may be a precancerous lesion.  She has also has one chest on the right side of her chest that has been there for several months.  No erythema or pruritus.  She has not tried any treatments for either of these lesions.  ROS: Per HPI  PMH: She reports that she has been smoking cigarettes.  She has been smoking about 1.00 pack per day. she has never used smokeless tobacco. She reports that she does not drink alcohol or use drugs.  Objective:  Physical Exam: BP 120/78 (BP Location: Left Arm, Patient Position: Sitting, Cuff Size: Large)   Pulse 80   Temp 98.5 F (36.9 C) (Oral)   Ht 5\' 3"  (1.6 m)   Wt 222 lb 6.4 oz (100.9 kg)   LMP 11/23/2015   SpO2 95%   BMI 39.40 kg/m   Gen: NAD, resting comfortably HEENT: TMs clear bilaterally.  Gross swelling to left parotid gland.  No tenderness.  No fluctuance.  Normal dentition without areas of obvious dental infection. Skin: -Face: Small hyperkeratotic  lesion without pigmentation just inferior to right eye. -Chest: Small hyperkeratotic lesion on right chest wall without pigmentation.  -Breast: Approximately 3 x 4 cm abscess along inferior aspect of left breast with 5 x 10 mm opening.  Purulent drainage present.  Wound was probed with Q-tip and found to have small shelf.  No deep tracking noted.  She does have a small amount of surrounding erythema with tenderness.  Cryotherapy Procedure Note  Pre-operative Diagnosis: Actinic keratosis  Locations: Right face and right chest  Indications: Therapeutic  Procedure Details  Patient informed of risks (permanent scarring, infection, light or dark discoloration, bleeding, infection, weakness, numbness and recurrence of the lesion) and benefits of the procedure and verbal informed consent obtained.  The areas are treated with histofreeze, frozen until ice ball extended 2 mm beyond lesion, allowed to thaw, and treated again. The patient tolerated procedure well.  The patient was instructed on post-op care, warned that there may be blister formation, redness and pain. Recommend OTC analgesia as needed for pain.  Condition: Stable  Complications: none.  Assessment/Plan:  Salivary Gland Swelling.  No red flag signs or symptoms.  No signs of infection.  Discussed conservative measures including sour hard candies, warm compresses, and gentle massages.  Discussed return precautions.  Follow-up as needed.  Actinic keratoses Cryotherapy applied today with Histofreeze.  Please see above procedure note.  Advised  patient to return if symptoms persist or do not resolve over the next few weeks.  Abscess Wound was draining purulent material-no indication for I&D today.  Given her surrounding erythema we will supply a short course of doxycycline.  Given complex nature of wound opening-doubt that this will heal spontaneously.  Referral to wound care placed today.  [name], MD 08/24/2017 11:33 AM

## 2017-08-24 NOTE — Patient Instructions (Signed)
Please start the doxycycline. I will send in a referral to the wound care center.  Please let me know if the spot on your face or chest does not clear up.  Salivary Stone A salivary stone is a mineral deposit that builds up in the ducts that drain your salivary glands. Most salivary gland stones are made of calcium. When a stone forms, saliva can back up into the gland and cause painful swelling. Your salivary glands are the glands that produce spit (saliva). You have six major salivary glands. Each gland has a duct that carries saliva into your mouth. Saliva keeps your mouth moist and breaks down the food that you eat. It also helps to prevent tooth decay. Two salivary glands are located just in front of your ears (parotid). The ducts for these glands open up inside your cheeks, near your back teeth. You also have two glands under your tongue (sublingual) and two glands under your jaw (submandibular). The ducts for these glands open under your tongue. A stone can form in any salivary gland. The most common place for a salivary stone to develop is in a submandibular salivary gland. What are the causes? Any condition that reduces the flow of saliva may lead to stone formation. It is not known why some people form stones and others do not. What increases the risk? You may be more likely to develop a salivary stone if you:  Are female.  Do not drink enough water.  Smoke.  Have high blood pressure.  Have gout.  Have diabetes.  What are the signs or symptoms? The main sign of a salivary gland stone is sudden swelling of a salivary gland when eating. This usually happens under the jaw on one side. Other signs and symptoms include:  Swelling of the cheek or under the tongue when eating.  Pain in the swollen area.  Trouble chewing or swallowing.  Swelling that goes down after eating.  How is this diagnosed? Your health care provider may diagnose a salivary gland stone based on your signs  and symptoms. The health care provider will also do a physical exam. In many cases, a stone can be felt in a duct inside your mouth. You may need to see an ear, nose, and throat specialist (ENT or otolaryngologist) for diagnosis and treatment. You may also need to have diagnostic tests. These may include imaging studies to check for a stone, such as:  X-rays.  Ultrasound.  CT scan.  MRI.  How is this treated? Home care may be enough to treat a small stone that is not causing symptoms. Treatment of a stone that is large enough to cause symptoms may include:  Probing and widening the duct to allow the stone to pass.  Inserting a thin, flexible scope (endoscope) into the duct to locate and remove the stone.  Breaking up the stone with sound waves.  Removing the entire salivary gland.  Follow these instructions at home:  Drink enough fluid to keep your urine clear or pale yellow.  Follow these instructions every few hours: ? Suck on a lemon candy to stimulate the flow of saliva. ? Put a hot compress over the gland. ? Gently massage the gland.  Do not use any tobacco products, including cigarettes, chewing tobacco, or electronic cigarettes. If you need help quitting, ask your health care provider. Contact a health care provider if:  You have pain and swelling in your face, jaw, or mouth after eating.  You have persistent swelling  in any of these places: ? In front of your ear. ? Under your jaw. ? Inside your mouth. Get help right away if:  You have pain and swelling in your face, jaw, or mouth that are getting worse.  Your pain and swelling make it hard to swallow or breathe. This information is not intended to replace advice given to you by your health care provider. Make sure you discuss any questions you have with your health care provider. Document Released: 08/05/2004 Document Revised: 12/04/2015 Document Reviewed: 11/28/2013 Elsevier Interactive Patient Education  2018  [employer].

## 2017-10-20 IMAGING — DX DG KNEE AP/LAT W/ SUNRISE*L*
3 series · 3 of 3 positions shown · non-contrast
Comparison: None.

CLINICAL DATA: Increasing knee pain, initial encounter, no known
injury

EXAM:
LEFT KNEE 3 VIEWS

[knee standing ap]
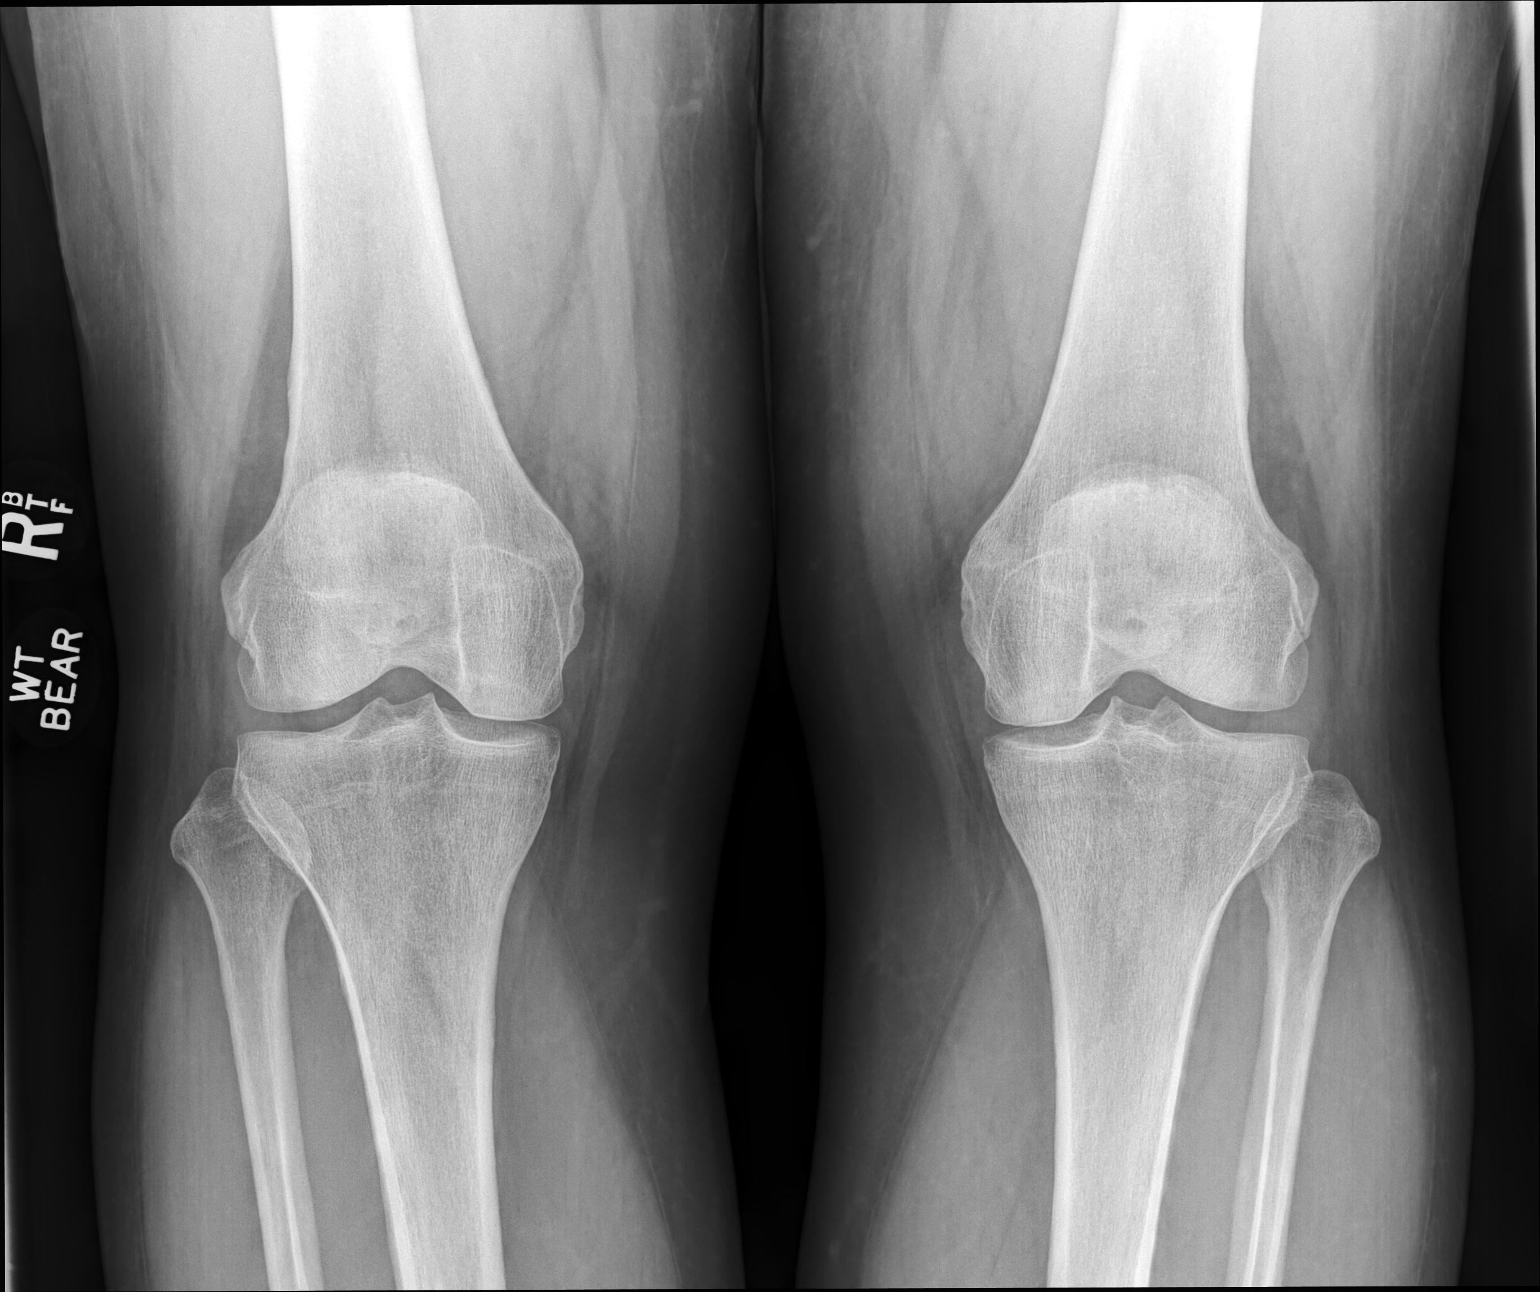

[knee standing lat]
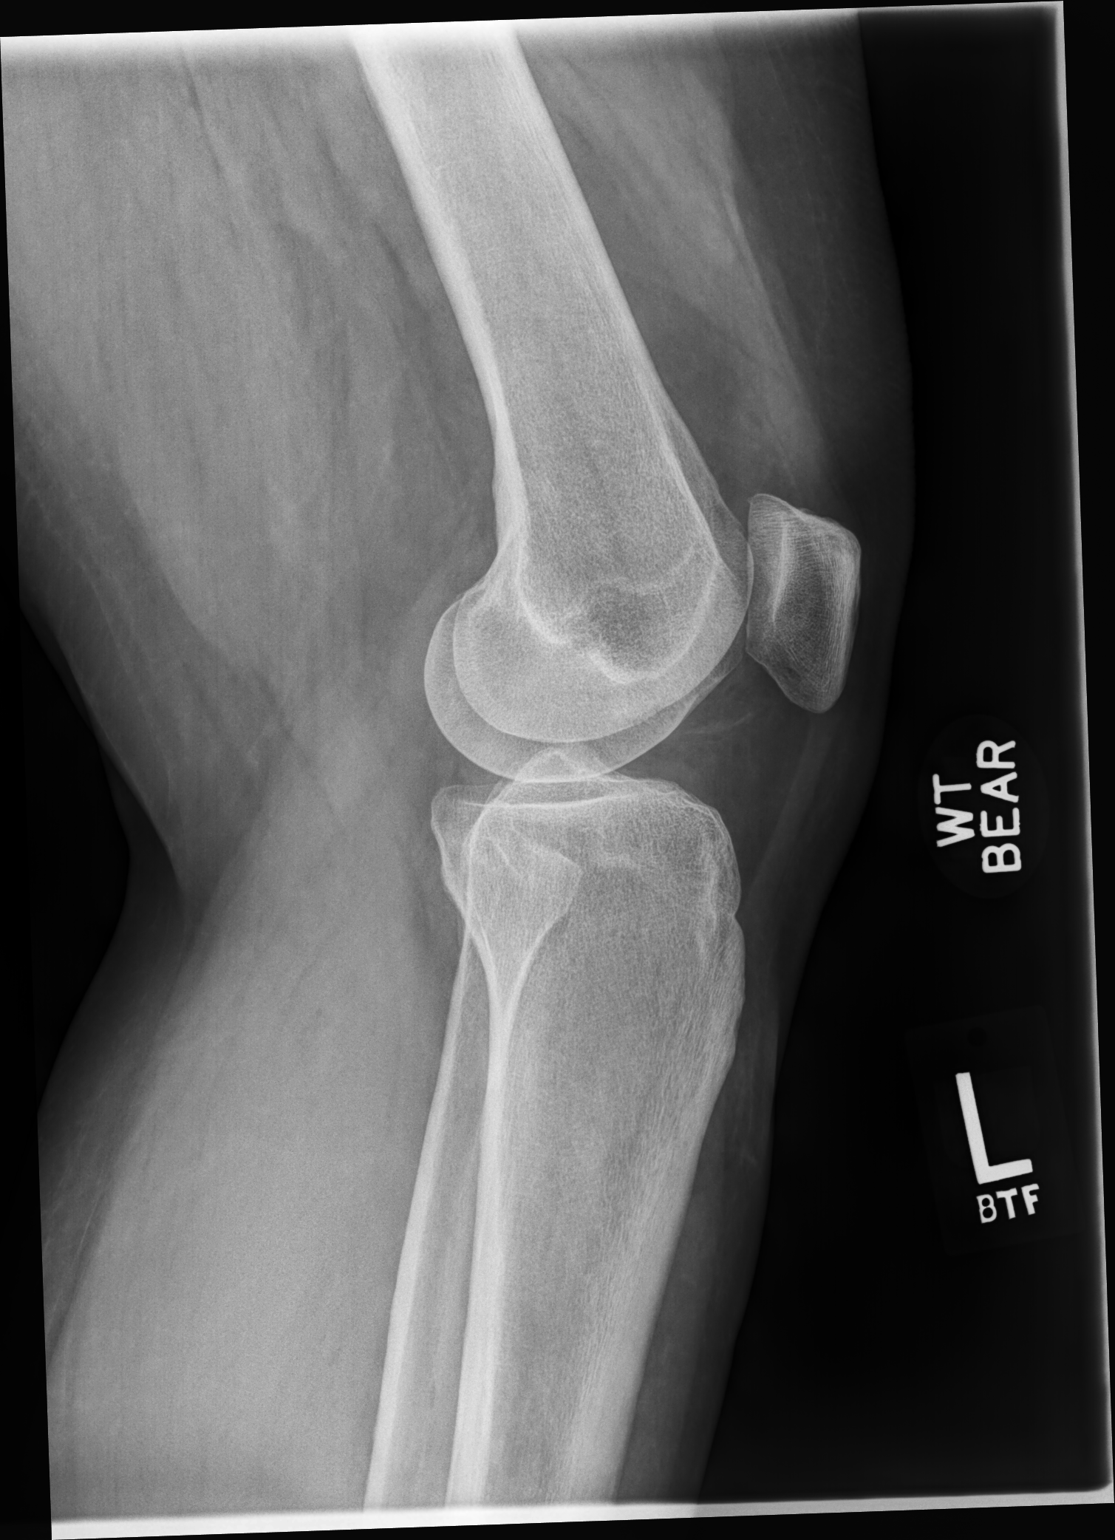

[sunrise]
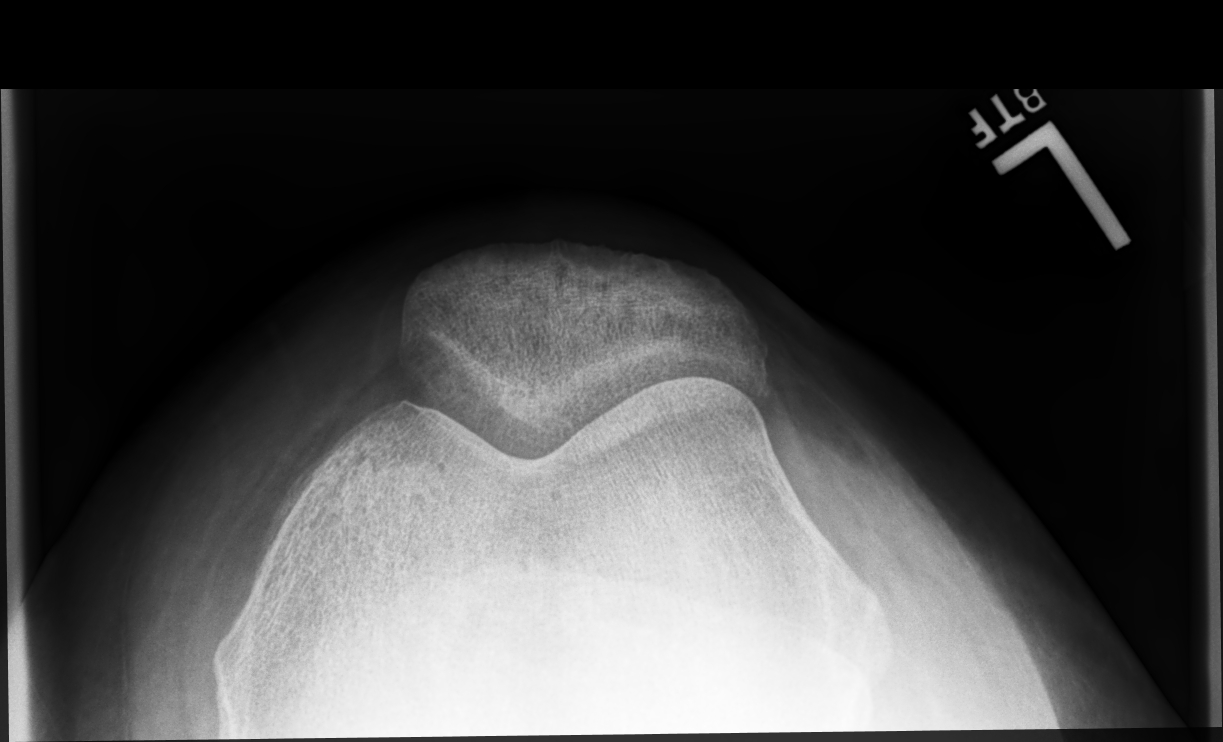

[3 of 3 positions shown; findings below may reference images not displayed]

FINDINGS: Mild medial joint space narrowing is seen. No joint effusion is
noted. No acute fracture or dislocation is seen.
IMPRESSION: Mild degenerative change without acute abnormality.

## 2017-10-20 IMAGING — DX DG ANKLE 2V *R*
2 series · 2 of 2 positions shown · non-contrast
Comparison: None.

CLINICAL DATA: Right ankle pain, no known injury, initial encounter

EXAM:
RIGHT ANKLE - 2 VIEW

[ankle ap]
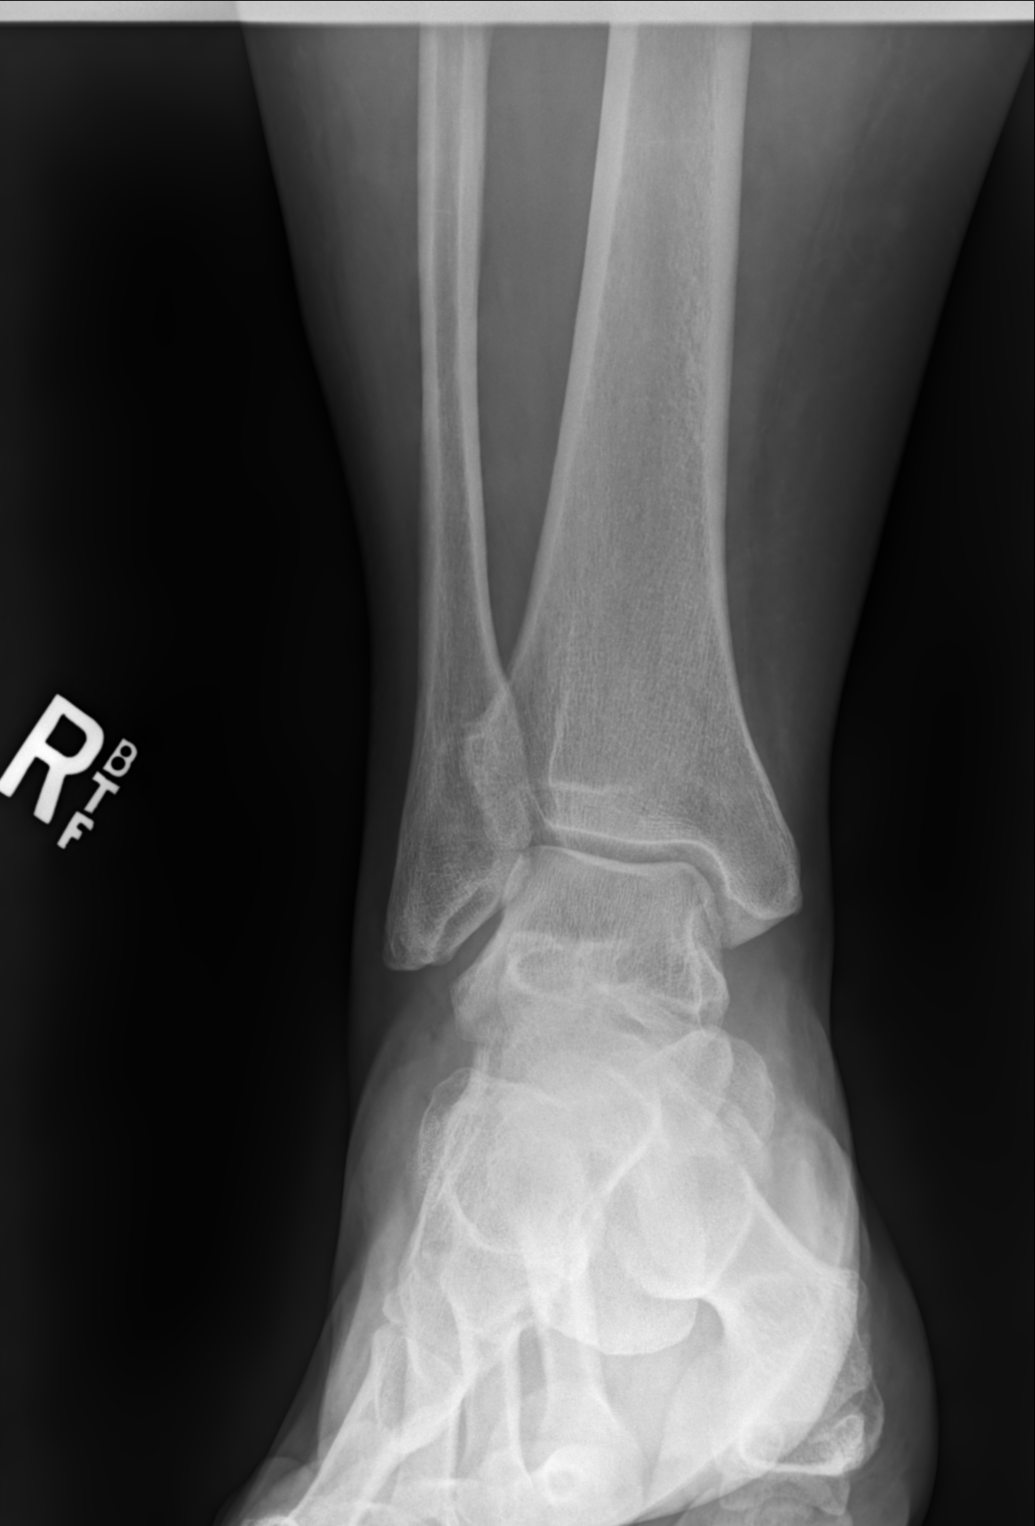

[ankle lat]
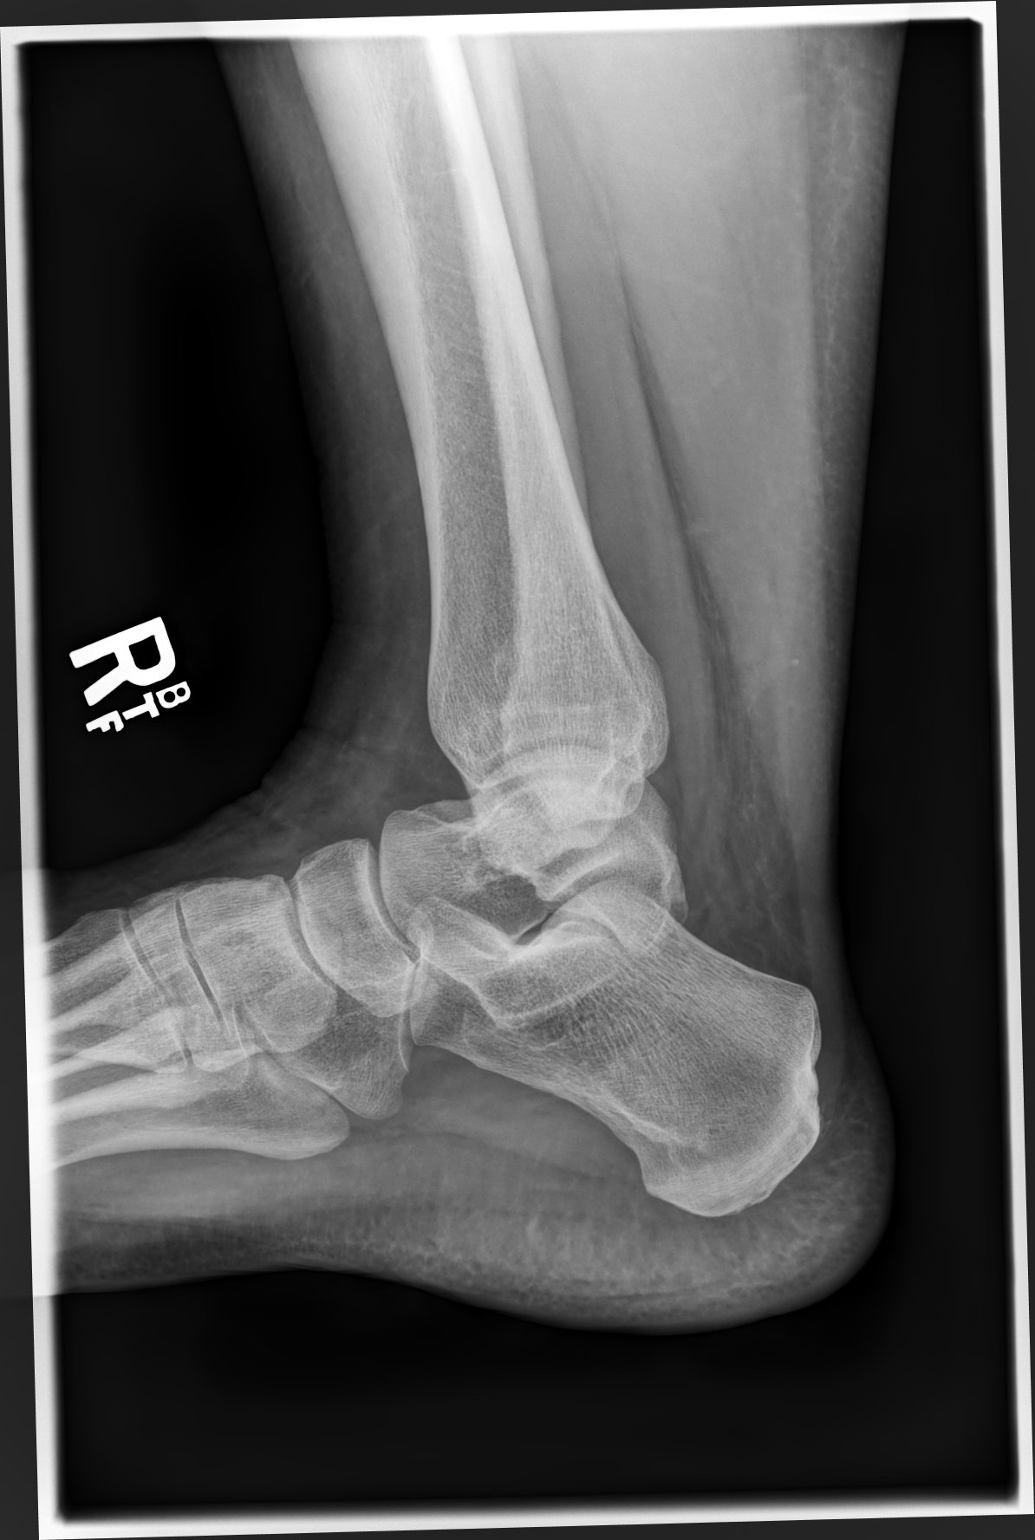

[2 of 2 positions shown; findings below may reference images not displayed]

FINDINGS: There is no evidence of fracture, dislocation, or joint effusion.
There is no evidence of arthropathy or other focal bone abnormality.
Soft tissues are unremarkable.
IMPRESSION: No acute abnormality noted.

## 2017-11-24 ENCOUNTER — Encounter: Payer: Self-pay | Admitting: Family Medicine

## 2017-11-24 ENCOUNTER — Ambulatory Visit: Payer: Commercial Managed Care - PPO | Admitting: Family Medicine

## 2017-11-24 VITALS — BP 108/68 | HR 94 | Temp 98.4°F | Resp 12 | Wt 219.6 lb

## 2017-11-24 DIAGNOSIS — R079 Chest pain, unspecified: Secondary | ICD-10-CM

## 2017-11-24 DIAGNOSIS — R110 Nausea: Secondary | ICD-10-CM

## 2017-11-24 DIAGNOSIS — R140 Abdominal distension (gaseous): Secondary | ICD-10-CM | POA: Diagnosis not present

## 2017-11-24 DIAGNOSIS — R197 Diarrhea, unspecified: Secondary | ICD-10-CM | POA: Diagnosis not present

## 2017-11-24 MED ORDER — ONDANSETRON 8 MG PO TBDP: 8.0000 mg | ORAL_TABLET | Freq: Three times a day (TID) | ORAL | 0 refills | Status: AC | PRN

## 2017-11-24 NOTE — Progress Notes (Signed)
    Subjective:  Kristin Mejia is a 47 y.o. female who presents today for same-day appointment with a chief complaint of chest pain.   HPI:  Chest Pain, acute problem Started 4 days ago.  Located in left upper chest and shoulder.  Comes and goes.  Mild pain.  Rated as 1 out of 10.  Associated with mild nausea.  No vomiting.  No pain with deep inspiration.  Symptoms are nonexertional.  No jaw pain.  Sometimes radiates into the left arm.  Associated with some diarrhea and abdominal bloating.  No obvious precipitating events.  No sick contacts.  Has tried several treatments including Ativan and Phenergan which have not helped.  ROS: Per HPI  PMH: She reports that she has been smoking cigarettes.  She has been smoking about 1.00 pack per day. She has never used smokeless tobacco. She reports that she does not drink alcohol or use drugs.  Objective:  Physical Exam: BP 108/68   Pulse 94   Temp 98.4 F (36.9 C) (Oral)   Resp 12   Wt 219 lb 9.6 oz (99.6 kg)   LMP 11/23/2015   SpO2 96%   BMI 38.90 kg/m   Gen: NAD, resting comfortably CV: RRR with no murmurs appreciated Pulm: NWOB, CTAB with no crackles, wheezes, or rhonchi Chest: No deformities.  Nontender to palpation. GI: Normal bowel sounds present. Soft, Nontender, Nondistended.  EKG: Normal sinus rhythm.  No ischemic changes.  Assessment/Plan:  Chest pain Patient's history not consistent with cardiac etiology.  Her EKG today is normal without any signs of ischemic changes and her exam is normal.  Vital signs are within normal limits.  Unclear underlying etiology for chest pain, however may be due to referred pain from abdominal bloating given that it primarily seems to be affecting her left shoulder.  We will treat her GI symptoms as outlined below.  Discussed reasons to return to care and seek emergent care.    Abdominal bloating/nausea/diarrhea Likely secondary to gastroenteritis.  No red flag signs or symptoms.  Will start  Zofran to help her with her nausea.  Encouraged good oral hydration.  She may have a component of gastroparesis given her history of type 2 diabetes however this is likely given lack of vomiting or regurgitation.  If no improvement over the next several days with above treatment, will consider advanced imaging and/referral to GI.  Katina Degree. Jimmey Ralph, MD 11/24/2017 3:52 PM

## 2018-01-03 ENCOUNTER — Other Ambulatory Visit: Payer: Self-pay | Admitting: Family Medicine

## 2018-04-27 ENCOUNTER — Telehealth: Payer: Self-pay | Admitting: Family Medicine

## 2018-04-27 ENCOUNTER — Emergency Department (HOSPITAL_COMMUNITY)
Admission: EM | Admit: 2018-04-27 | Discharge: 2018-04-28 | Disposition: A | Discharge status: Home / Self Care | Payer: Self-pay | Attending: Emergency Medicine | Admitting: Emergency Medicine

## 2018-04-27 ENCOUNTER — Other Ambulatory Visit: Payer: Self-pay

## 2018-04-27 ENCOUNTER — Encounter: Payer: Self-pay | Admitting: Family Medicine

## 2018-04-27 ENCOUNTER — Ambulatory Visit: Payer: Commercial Managed Care - PPO | Admitting: Family Medicine

## 2018-04-27 ENCOUNTER — Encounter (HOSPITAL_COMMUNITY): Payer: Self-pay | Admitting: Emergency Medicine

## 2018-04-27 VITALS — BP 132/74 | HR 103 | Temp 98.3°F | Ht 63.0 in | Wt 206.6 lb

## 2018-04-27 DIAGNOSIS — Z79899 Other long term (current) drug therapy: Secondary | ICD-10-CM | POA: Insufficient documentation

## 2018-04-27 DIAGNOSIS — F17210 Nicotine dependence, cigarettes, uncomplicated: Secondary | ICD-10-CM | POA: Insufficient documentation

## 2018-04-27 DIAGNOSIS — Z794 Long term (current) use of insulin: Secondary | ICD-10-CM | POA: Insufficient documentation

## 2018-04-27 DIAGNOSIS — M797 Fibromyalgia: Secondary | ICD-10-CM | POA: Insufficient documentation

## 2018-04-27 DIAGNOSIS — N3000 Acute cystitis without hematuria: Secondary | ICD-10-CM

## 2018-04-27 DIAGNOSIS — R300 Dysuria: Secondary | ICD-10-CM

## 2018-04-27 DIAGNOSIS — E119 Type 2 diabetes mellitus without complications: Secondary | ICD-10-CM | POA: Insufficient documentation

## 2018-04-27 HISTORY — DX: Fibromyalgia: M79.7

## 2018-04-27 LAB — POCT URINALYSIS DIPSTICK
Glucose, UA: POSITIVE — AB
KETONES UA: NEGATIVE
NITRITE UA: POSITIVE
PH UA: 5 (ref 5.0–8.0)
PROTEIN UA: NEGATIVE
RBC UA: NEGATIVE
Spec Grav, UA: 1.01 (ref 1.010–1.025)
UROBILINOGEN UA: 2 U/dL — AB

## 2018-04-27 MED ORDER — NITROFURANTOIN MONOHYD MACRO 100 MG PO CAPS: 100.0000 mg | ORAL_CAPSULE | Freq: Two times a day (BID) | ORAL | 0 refills | Status: AC

## 2018-04-27 MED ORDER — KETOROLAC TROMETHAMINE 10 MG PO TABS: 10.0000 mg | ORAL_TABLET | Freq: Four times a day (QID) | ORAL | 0 refills | Status: AC | PRN

## 2018-04-27 MED ORDER — KETOROLAC TROMETHAMINE 60 MG/2ML IM SOLN
60.0000 mg | Freq: Once | INTRAMUSCULAR | Status: AC
  Administered 2018-04-27: 60 mg via INTRAMUSCULAR

## 2018-04-27 MED ORDER — PHENAZOPYRIDINE HCL 200 MG PO TABS: 200.0000 mg | ORAL_TABLET | Freq: Three times a day (TID) | ORAL | 0 refills | Status: AC | PRN

## 2018-04-27 MED ORDER — ONDANSETRON 4 MG PO TBDP
4.0000 mg | ORAL_TABLET | Freq: Once | ORAL | Status: AC | PRN
  Administered 2018-04-27: 4 mg via ORAL
  Filled 2018-04-27: qty 1

## 2018-04-27 NOTE — Progress Notes (Signed)
   Subjective:  Kristin Mejia is a 47 y.o. female who presents today for same-day appointment with a chief complaint of Dysuria.   HPI:  Dysuria, Acute problem Started 5 days ago.  Associated with left lower abdominal pain, urinary frequency, and nausea.  Her abdominal pain is severe.  Patient is on chronic narcotics for chronic pain.  She ended up trying to use an expired fentanyl patch which did not help with her pain.  No fevers or chills.  Symptoms feel like prior UTIs.  No hematuria.  No other obvious alleviating or aggravating factors.  ROS: Per HPI  PMH: She reports that she has been smoking cigarettes. She has been smoking about 1.00 pack per day. She has never used smokeless tobacco. She reports that she does not drink alcohol or use drugs.  Objective:  Physical Exam: BP 132/74 (BP Location: Left Arm, Patient Position: Sitting, Cuff Size: Normal)   Pulse (!) 103   Temp 98.3 F (36.8 C) (Oral)   Ht 5\' 3"  (1.6 m)   Wt 206 lb 9.6 oz (93.7 kg)   LMP 11/23/2015   SpO2 95%   BMI 36.60 kg/m   Gen: NAD, resting comfortably CV: RRR with no murmurs appreciated Pulm: NWOB, CTAB with no crackles, wheezes, or rhonchi GI: Normal bowel sounds present. Soft, Nontender, Nondistended. MSK: No edema, cyanosis, or clubbing noted.  No CVA tenderness.  Results for orders placed or performed in visit on 04/27/18 (from the past 24 hour(s))  POCT urinalysis dipstick     Status: Abnormal   Collection Time: 04/27/18  8:52 AM  Result Value Ref Range   Color, UA Orange    Clarity, UA Clear    Glucose, UA Positive (A) Negative   Bilirubin, UA Small    Ketones, UA Negative    Spec Grav, UA 1.010 1.010 - 1.025   Blood, UA Negative    pH, UA 5.0 5.0 - 8.0   Protein, UA Negative Negative   Urobilinogen, UA 2.0 (A) 0.2 or 1.0 E.U./dL   Nitrite, UA Positive    Leukocytes, UA Trace (A) Negative   Appearance     Odor       Assessment/Plan:  UTI/Abdominal Pain History and UA consistent  with UTI.  She has not tolerated Keflex in the past-we will start a course of Macrobid.  She does not have any red flag signs or symptoms or other signs of systemic illness.  Patient has quite a bit of severe lower abdominal pain.  She has underlying fibromyalgia which is likely contributing and exacerbating her pain.  Given her severe degree of pain, we will give 60 mg of IM Toradol today and additionally start a 4-day course of Toradol as needed.  Gave strict instruction that she should avoid all other NSAIDs while on this medication and that she should additionally not be on this medication for more than 5 days total.  She does not have a history of kidney disease or peptic ulcer disease.  Patient voiced understanding.  Also send in a prescription for Pyridium for the next couple of days as well.  Discussed reasons to return to care and seek emergent care.  Katina Degree. Jimmey Ralph, MD 04/27/2018 9:00 AM

## 2018-04-27 NOTE — ED Triage Notes (Addendum)
Pt from home with 6 days of pain secondary to fibromyalgia. Pt states she was seen by PCP who does not "treat fibromyalgia like it is real". Pt states she has gone 55 hours without sleep and has generalized pain of 8/10. Pt reports diarrhea and emesis every few hours today. Pt reported CP that began while this rn was in room but refused EKG. Pain sat on left chest and did not radiate

## 2018-04-27 NOTE — Telephone Encounter (Signed)
Copied from CRM [id number]. Topic: Quick Communication - See Telephone Encounter >> Apr 27, 2018  1:37 PM Lorrine Kin, [place] wrote: CRM for notification. See Telephone encounter for: 04/27/18. Patient states that she came in this morning and is having major fibromyalgia pain. States that she was given a shot in the office and it has been 4.5 hours and her pain level has not gone down and. States that she is on day 6 of the pain and 48 plus hours with no sleep. Would like to know if there is anything else that could be tried? Please advise. CB#:[phone] [id number] [place], [place] - [address]

## 2018-04-27 NOTE — Patient Instructions (Signed)
It was nice to see you!  You have a urinary tract infection. Please start the antibiotic.  We will check a urine culture to make sure you do not have a resistant bacteria. We will call you if we need to change your medications.   Please make sure you are drinking plenty of fluids over the next few days.  If your symptoms do not improve over the next 5-7 days, or if they worsen, please let us know. Please also let us know if you have worsening back pain, fevers, chills, or body aches.   We will give you an injection of toradol today. This is an anti-inflammatory. Do not take any other anti-inflammatories while on this medication.  I will also send in a oral tablet of toradol. Please every 6 hours as needed for the next 4 days starting tomorrow.   Take care, Dr Jimmey Ralph

## 2018-04-27 NOTE — Telephone Encounter (Signed)
Please advise 

## 2018-04-28 ENCOUNTER — Encounter: Payer: Self-pay | Admitting: Family Medicine

## 2018-04-28 ENCOUNTER — Ambulatory Visit: Payer: Self-pay | Admitting: Family Medicine

## 2018-04-28 VITALS — BP 120/60 | HR 118 | Temp 97.8°F | Wt 209.6 lb

## 2018-04-28 DIAGNOSIS — M797 Fibromyalgia: Secondary | ICD-10-CM

## 2018-04-28 DIAGNOSIS — G894 Chronic pain syndrome: Secondary | ICD-10-CM

## 2018-04-28 MED ORDER — HYDROCODONE-ACETAMINOPHEN 10-325 MG PO TABS: 1.0000 | ORAL_TABLET | ORAL | 0 refills | Status: AC | PRN

## 2018-04-28 MED ORDER — HYDROCODONE-ACETAMINOPHEN 5-325 MG PO TABS
1.0000 | ORAL_TABLET | Freq: Once | ORAL | Status: AC
  Administered 2018-04-28: 1 via ORAL
  Filled 2018-04-28: qty 1

## 2018-04-28 MED ORDER — CYCLOBENZAPRINE HCL 10 MG PO TABS
10.0000 mg | ORAL_TABLET | Freq: Once | ORAL | Status: DC
  Filled 2018-04-28: qty 1

## 2018-04-28 MED ORDER — NAPROXEN 375 MG PO TABS: 375.0000 mg | ORAL_TABLET | Freq: Two times a day (BID) | ORAL | 0 refills | Status: AC

## 2018-04-28 MED ORDER — ZOLPIDEM TARTRATE 5 MG PO TABS
5.0000 mg | ORAL_TABLET | Freq: Every evening | ORAL | Status: DC | PRN
  Administered 2018-04-28: 5 mg via ORAL
  Filled 2018-04-28: qty 1

## 2018-04-28 MED ORDER — NAPROXEN 500 MG PO TABS
500.0000 mg | ORAL_TABLET | Freq: Once | ORAL | Status: AC
  Administered 2018-04-28: 500 mg via ORAL
  Filled 2018-04-28: qty 1

## 2018-04-28 MED ORDER — DEXAMETHASONE SODIUM PHOSPHATE 10 MG/ML IJ SOLN
10.0000 mg | Freq: Once | INTRAMUSCULAR | Status: AC
  Administered 2018-04-28: 10 mg via INTRAMUSCULAR
  Filled 2018-04-28: qty 1

## 2018-04-28 MED ORDER — CYCLOBENZAPRINE HCL 10 MG PO TABS: 10.0000 mg | ORAL_TABLET | Freq: Two times a day (BID) | ORAL | 0 refills | Status: AC | PRN

## 2018-04-28 MED ORDER — PREDNISONE 10 MG PO TABS: 50.0000 mg | ORAL_TABLET | Freq: Every day | ORAL | 0 refills | Status: AC

## 2018-04-28 MED ORDER — ACETAMINOPHEN ER 650 MG PO TBCR: 650.0000 mg | EXTENDED_RELEASE_TABLET | Freq: Three times a day (TID) | ORAL | 0 refills | Status: AC | PRN

## 2018-04-28 MED ORDER — ZOLPIDEM TARTRATE 5 MG PO TABS: 5.0000 mg | ORAL_TABLET | Freq: Every evening | ORAL | Status: DC | PRN

## 2018-04-28 NOTE — Progress Notes (Signed)
   Subjective:  Kristin Mejia is a 47 y.o. female who presents today for same-day appointment with a chief complaint of fibromyalgia.   HPI:  Fibromyalgia, new problem to provider Patient was seen about a year ago clinic for diffuse musculoskeletal pain.  Had negative work-up at that time including normal CBC, RF, ANA, TSH, and CMET.  She was referred to rheumatology where she was apparently diagnosed with fibromyalgia.  She has also been seeing a pain clinic that has been prescribing her chronic narcotic therapy.  Patient has had a recent flareup of her symptoms over the last several days to weeks.  Patient reports that she accidentally had her last prescription for narcotics and thrown away.  She was recently diagnosed with a UTI and has been under a lot of life stress recently due to moving to a new state.  Now has pain from head to her toe.  She went to the emergency department yesterday and was given intramuscular steroids. She was also given norco with significant improvement in her pain. Patient requests a refill of her norco today. Denies any other obvious alleviating or aggraating factors.   ROS: Per HPI  PMH: She reports that she has been smoking cigarettes. She has been smoking about 1.00 pack per day. She has never used smokeless tobacco. She reports that she does not drink alcohol or use drugs.  Objective:  Physical Exam: BP 120/60 (BP Location: Right Arm, Patient Position: Sitting, Cuff Size: Large)   Pulse (!) 118   Temp 97.8 F (36.6 C) (Oral)   Wt 209 lb 9.6 oz (95.1 kg)   LMP 11/23/2015   SpO2 96%   BMI 37.13 kg/m   Gen: NAD, resting comfortably CV: RRR with no murmurs appreciated Pulm: NWOB, CTAB with no crackles, wheezes, or rhonchi GI: Normal bowel sounds present. Soft, Nontender, Nondistended. MSK: No edema, cyanosis, or clubbing noted Skin: Warm, dry Neuro: Grossly normal, moves all extremities Psych: Normal affect and thought content  Assessment/Plan:    Pain Patient here with worsening for chronic fibromyalgia pain.  Likely worsened in setting of recent UTI.  She will be moving out of state next week.  I reviewed her drug database and did not note any red flags.  She is on norco 10-325 every 4 hours and morphine 15 mg twice daily.  I instructed patient that I would only be able to give her 5 days of her previous dose of Norco.  She will need to follow-up with her pain clinic for further refills.  Patient voiced understanding.  Katina Degree. Jimmey Ralph, MD 04/28/2018 4:04 PM

## 2018-04-28 NOTE — ED Provider Notes (Signed)
[facility]-EMERGENCY DEPT Provider Note   CSN: [id number] Arrival date & time: 04/27/18  1834     History   Chief Complaint Chief Complaint  Patient presents with  . Generalized Body Aches    HPI Kristin Mejia is a 47 y.o. female.  HPI 47 year old female with history of diabetes, fibromyalgia, CHF and anxiety comes in with chief complaint of generalized body pain.  Patient reports that she is been having burning type pain from her head to toe for the last 7 days.  Typically her symptoms resolve on their own, however with this current episode she has been unable to sleep last 3 or 4 days and has had persistent pain.  Patient thinks that the trigger was stress related to new job along with requirement to move states.  Patient denies any nausea, vomiting, fevers, chills.  She saw her PCP earlier and was given IM Toradol which has not helped.  She has been taking over-the-counter medications at home without significant relief. Past Medical History:  Diagnosis Date  . Anxiety   . CHF (congestive heart failure) (HCC)   . Depression   . Diabetes mellitus without complication (HCC)   . Fibromyalgia   . Hydradenitis   . Hyperlipidemia     Patient Active Problem List   Diagnosis Date Noted  . Musculoskeletal pain 04/11/2017  . Nausea 04/11/2017  . Diabetes mellitus type 2, uncontrolled, with complications (HCC) 09/08/2016  . Hyperlipidemia associated with type 2 diabetes mellitus (HCC) 09/04/2015  . History of nonmelanoma skin cancer 01/28/2014  . Fatigue 11/23/2013  . Obesity, Class II, BMI 35-39.9 08/13/2013  . Elevated liver enzymes 08/13/2013  . Other enthesopathy of ankle and tarsus 06/27/2013  . Os peroneum syndrome of left foot 06/27/2013  . Atypical glandular cells on Pap smear 02/09/2013  . Ovarian cyst, right 02/09/2013  . Hypertension associated with diabetes (HCC) 11/28/2012  . Tobacco abuse 11/28/2012  . Hydradenitis 10/11/2012     Past Surgical History:  Procedure Laterality Date  . CESAREAN SECTION    . FOOT SURGERY     bone removal/tendon replacement  . HYDRADENITIS EXCISION     12 x 1998/2005 on both thighs/axilla/breast  . REDUCTION MAMMAPLASTY Bilateral 1998  . STOMACH SURGERY     tummy tuck     OB History   None      Home Medications    Prior to Admission medications   Medication Sig Start Date End Date Taking? Authorizing Provider  aspirin 81 MG chewable tablet Chew by mouth.    [provider]  BD PEN NEEDLE NANO U/F 32G X 4 MM MISC USE AS DIRECTED DAILY 08/15/17   Ardith Dark, MD  Blood Glucose Monitoring Suppl (GLUCOCOM BLOOD GLUCOSE MONITOR) DEVI USE TO CHECK BLOOD SUGARS UP TO THREE TIMES DAILY 09/07/16   [provider]  glipiZIDE (GLUCOTROL XL) 5 MG 24 hr tablet TAKE 1 TABLET (5 MG TOTAL) BY MOUTH DAILY. 11/10/16   [provider]  glucose blood (ONETOUCH VERIO) test strip 1 each by Other route as needed for other. Use as instructed 12/13/16   Emi Belfast, FNP  hydrochlorothiazide (HYDRODIURIL) 25 MG tablet Take 25 mg by mouth daily.    [provider]  HYDROcodone-acetaminophen (NORCO) 10-325 MG per tablet Take 1 tablet by mouth every 6 (six) hours as needed for moderate pain or severe pain.     [provider]  hydrocortisone 2.5 % cream  10/27/16   [provider]  Insulin Glargine (BASAGLAR KWIKPEN) 100 UNIT/ML SOPN INJECT 10 UNITS INTO THE SKIN AT BEDTIME.IF BLOOD SUGAR <200 INCREASE BY 2 UNITS MAX OF 20 UNITS 08/22/17   Ardith Dark, MD  Insulin Pen Needle 31G X 4 MM MISC 1 Units by Does not apply route daily. 11/22/16   Emi Belfast, FNP  isosorbide mononitrate (IMDUR) 30 MG 24 hr tablet Take 30 mg by mouth. 01/01/16 12/31/16  [provider]  ketorolac (TORADOL) 10 MG tablet Take 1 tablet (10 mg total) by mouth every 6 (six) hours as needed for up to 4 days. 04/27/18 05/01/18  Ardith Dark, MD  Lancets MISC  USE TO CHECK BLOOD SUGARS UP TO THREE TIMES DAILY 09/07/16   [provider]  LORazepam (ATIVAN) 2 MG tablet Take 2 mg by mouth every 6 (six) hours as needed for anxiety.    [provider]  metFORMIN (GLUCOPHAGE) 1000 MG tablet Take 1,000 mg by mouth. 05/12/16 05/12/17  [provider]  metoprolol succinate (TOPROL-XL) 100 MG 24 hr tablet Take 100 mg by mouth. 02/26/16 02/25/17  [provider]  morphine (MSIR) 15 MG tablet Take 15 mg by mouth every 4 (four) hours as needed for moderate pain or severe pain.     [provider]  nitrofurantoin, macrocrystal-monohydrate, (MACROBID) 100 MG capsule Take 1 capsule (100 mg total) by mouth 2 (two) times daily. 04/27/18   Ardith Dark, MD  ondansetron (ZOFRAN ODT) 8 MG disintegrating tablet Take 1 tablet (8 mg total) by mouth every 8 (eight) hours as needed for nausea or vomiting. 11/24/17   Ardith Dark, MD  phenazopyridine (PYRIDIUM) 200 MG tablet Take 1 tablet (200 mg total) by mouth 3 (three) times daily as needed for pain. 04/27/18   Ardith Dark, MD  PROAIR HFA 108 [id number] Base) MCG/ACT inhaler  08/20/16   [provider]  promethazine (PHENERGAN) 25 MG tablet Take 1 tablet (25 mg total) by mouth every 8 (eight) hours as needed for nausea or vomiting. 04/11/17   Ardith Dark, MD  simvastatin (ZOCOR) 40 MG tablet Take 40 mg by mouth. 12/24/15   [provider]  tiZANidine (ZANAFLEX) 4 MG tablet Take 4 mg by mouth 3 (three) times daily as needed for muscle spasms.  09/06/15   [provider]  triazolam (HALCION) 0.25 MG tablet Take 0.25 mg by mouth at bedtime as needed for sleep.    [provider]    Family History Family History  Problem Relation Age of Onset  . Cancer Maternal Aunt        throat  . Cancer Maternal Grandfather        throat    Social History Social History   Tobacco Use  . Smoking status: Current Every Day Smoker    Packs/day: 1.00    Types:  Cigarettes  . Smokeless tobacco: Never Used  Substance Use Topics  . Alcohol use: No  . Drug use: No     Allergies   Varenicline; Wellbutrin [bupropion]; Enablex [darifenacin hydrobromide er]; Lyrica [pregabalin]; and Vesicare [solifenacin]   Review of Systems Review of Systems  Constitutional: Positive for activity change.  Musculoskeletal: Positive for arthralgias and myalgias.  Skin: Negative for rash.  Allergic/Immunologic: Negative for immunocompromised state.  Hematological: Does not bruise/bleed easily.     Physical Exam Updated Vital Signs BP (!) 170/100 (BP Location: Right Arm)   Pulse (!) 115   Temp 98.7 F (37.1 C) (Oral)   Resp  18   LMP 11/23/2015   SpO2 99%   Physical Exam  Constitutional: She is oriented to person, place, and time. She appears well-developed.  HENT:  Head: Normocephalic and atraumatic.  Eyes: EOM are normal.  Neck: Normal range of motion. Neck supple.  Cardiovascular: Normal rate.  Pulmonary/Chest: Effort normal.  Abdominal: Bowel sounds are normal.  Neurological: She is alert and oriented to person, place, and time.  Skin: Skin is warm and dry.  Nursing note and vitals reviewed.    ED Treatments / Results  Labs (all labs ordered are listed, but only abnormal results are displayed) Labs Reviewed - No data to display  EKG None  Radiology No results found.  Procedures Procedures (including critical care time)  Medications Ordered in ED Medications  naproxen (NAPROSYN) tablet 500 mg (has no administration in time range)  HYDROcodone-acetaminophen (NORCO/VICODIN) 5-325 MG per tablet 1 tablet (has no administration in time range)  dexamethasone (DECADRON) injection 10 mg (has no administration in time range)  cyclobenzaprine (FLEXERIL) tablet 10 mg (has no administration in time range)  ondansetron (ZOFRAN-ODT) disintegrating tablet 4 mg (4 mg Oral Given 04/27/18 2017)     Initial Impression / Assessment and Plan / ED  Course  I have reviewed the triage vital signs and the nursing notes.  Pertinent labs & imaging results that were available during my care of the patient were reviewed by me and considered in my medical decision making (see chart for details).     47 year old female comes in with chief complaint of severe pain.  She has history of fibromyalgia and reports that her pain is similar to her fibromyalgia flareup.  Unfortunately, unlike her previous flareup her symptoms are not resolving.  She has not been able to sleep the last few days which is also contributing to her worsening symptoms.  We will give her IM Decadron along with oral Naprosyn and Tylenol. Plan is to discharge her with over-the-counter pain medications with added prescription for prednisone and Flexeril. She has been advised to focus on getting adequate rest and exercise.  Final Clinical Impressions(s) / ED Diagnoses   Final diagnoses:  Fibromyalgia    ED Discharge Orders    None       Derwood Kaplan, MD 04/28/18 [id number]

## 2018-04-28 NOTE — Telephone Encounter (Signed)
Looks like pt went to the ED and got flexeril and prednisone. Would not recommend anything further at this point.   Kristin Mejia. Jimmey Ralph, MD 04/28/2018 8:07 AM

## 2018-04-28 NOTE — Telephone Encounter (Signed)
Pt notified of update. Pt has an appt. @ 3:40PM. Pt will discuss then.

## 2018-04-28 NOTE — Discharge Instructions (Addendum)
We saw in the ER for severe pain because of your fibromyalgia. We recommend that you get appropriate rest and focus on exercise (stretching/walking/Yoga etc.) and personal wellness. Additionally, we can put you on anti-inflammatory medications, muscle relaxants which might help.  If your symptoms continue, then you will have to see her primary care doctor for further treatment options.

## 2018-04-29 LAB — URINE CULTURE
MICRO NUMBER: 91249775
SPECIMEN QUALITY: ADEQUATE

## 2018-04-30 ENCOUNTER — Encounter: Payer: Self-pay | Admitting: Family Medicine

## 2018-05-01 ENCOUNTER — Telehealth: Payer: Self-pay | Admitting: Family Medicine

## 2018-05-01 NOTE — Telephone Encounter (Signed)
See note

## 2018-05-01 NOTE — Telephone Encounter (Signed)
Copied from CRM [id number]. Topic: General - Other >> May 01, 2018  8:38 AM Leafy Ro wrote: Reason for CRM:pt saw dr Jimmey Ralph on 04-28-18 and pt needs new rx hydrocodone from dr Jimmey Ralph. [facility] [address]

## 2018-05-01 NOTE — Telephone Encounter (Signed)
Rx was sent in 3 days ago at her office visit. She should check with her pharmacy.   Kristin Mejia. Jimmey Ralph, MD 05/01/2018 11:54 AM

## 2018-05-01 NOTE — Telephone Encounter (Signed)
Patient came in office in reference to notes below. Please call pt and advise ASAP.

## 2018-05-01 NOTE — Telephone Encounter (Signed)
Lm on pts vm and advised Rx was sent and to contact pharmacy directly

## 2018-05-01 NOTE — Progress Notes (Signed)
Please inform patient of the following:  Her urine culture was inconclusive. I would like for her to finish her antibiotics and let us know if her symptoms have not improved.  Kristin Mejia. Jimmey Ralph, MD 05/01/2018 2:08 PM

## 2018-05-01 NOTE — Telephone Encounter (Signed)
Last OV and Rx 10/18 #30.

## 2018-06-09 ENCOUNTER — Encounter (INDEPENDENT_AMBULATORY_CARE_PROVIDER_SITE_OTHER): Payer: Self-pay | Admitting: Family

## 2018-06-09 ENCOUNTER — Ambulatory Visit (INDEPENDENT_AMBULATORY_CARE_PROVIDER_SITE_OTHER): Payer: Self-pay | Admitting: Family

## 2018-06-09 VITALS — BP 94/65 | HR 71 | Temp 96.8°F | Resp 16 | Ht 63.0 in | Wt 208.0 lb

## 2018-06-09 DIAGNOSIS — M79602 Pain in left arm: Secondary | ICD-10-CM

## 2018-06-09 NOTE — Progress Notes (Signed)
[facility]  PROGRESS NOTE     Patient: [name]   Date: 06/09/2018   MRN: [id number]       [name] is a 47 y.o. female      HISTORY     Chief Complaint   Patient presents with   . Arm Injury     Pain elbow, no injury, pain for 2 weeks        47 YO F with IDDM, HTN, CHF c/o left arm pain x 2 weeks.  Started at elbow and is now entire left arm to wrist, left shoulder and left upper back.  Left arm feels weak sometimes.  Denies cp, sob, dizziness, palpitations, numbness, tingling, injury, leg swelling, injury.  No treatments tried. Moved to this area month ago.  Does not have pcp or endo.       Arm Pain    The incident occurred more than 1 week ago. The incident occurred at home. There was no injury mechanism. The pain is present in the left forearm, left shoulder and left elbow. The quality of the pain is described as aching. The pain radiates to the back. The pain is at a severity of 7/10. The pain is severe. The pain has been constant since the incident. Associated symptoms include muscle weakness. Pertinent negatives include no chest pain, numbness or tingling. The symptoms are aggravated by palpation, lifting and movement. She has tried nothing for the symptoms. The treatment provided no relief.       Review of Systems   Constitutional: Negative for activity change, chills, diaphoresis and fever.   HENT: Negative for congestion and rhinorrhea.    Respiratory: Negative for cough, chest tightness and shortness of breath.    Cardiovascular: Negative for chest pain, palpitations and leg swelling.   Gastrointestinal: Negative for abdominal pain, nausea and vomiting.   Musculoskeletal: Positive for arthralgias and back pain. Negative for gait problem, joint swelling, myalgias, neck pain and neck stiffness.   Skin: Negative for color change and wound.   Neurological: Negative for tingling, numbness and headaches.   Hematological: Does not bruise/bleed easily.   Psychiatric/Behavioral: Negative  for confusion and sleep disturbance. The patient is not nervous/anxious.        History:  Past Medical History:   Diagnosis Date   . Diabetes mellitus    . Hypertension        Past Surgical History:   Procedure Laterality Date   . BREAST SURGERY     . COSMETIC SURGERY         Family History   Problem Relation Age of Onset   . Hyperlipidemia Mother    . Alcohol abuse Father    . Hyperlipidemia Father        Social History     Tobacco Use   . Smoking status: Current Every Day Smoker   . Smokeless tobacco: Never Used   Substance Use Topics   . Alcohol use: Never     Frequency: Never   . Drug use: Never       History reviewed.        Current Outpatient Medications:   .  Armodafinil 150 MG tablet, Take 150 mg by mouth, Disp: , Rfl:   .  Blood Glucose Monitoring Suppl (GLUCOCOM BLOOD GLUCOSE MONITOR) Device, USE TO CHECK BLOOD SUGARS UP TO THREE TIMES DAILY, Disp: , Rfl:   .  Blood Glucose Monitoring Suppl (GLUCOCOM BLOOD GLUCOSE MONITOR) Device, USE TO CHECK BLOOD SUGARS UP  TO THREE TIMES DAILY, Disp: , Rfl:   .  glipiZIDE XL (GLUCOTROL XL) 5 MG 24 hr tablet, TAKE 1 TABLET (5 MG TOTAL) BY MOUTH DAILY., Disp: , Rfl:   .  glucose blood (ONETOUCH VERIO) test strip, Check blood sugars 3 times daily.   Diagnosis: Uncontrolled Type 2 DM, Disp: , Rfl:   .  glucose blood (ONETOUCH VERIO) test strip, , Disp: , Rfl:   .  glucose blood test strip, 1 each by Other route, Disp: , Rfl:   .  hydroCHLOROthiazide (HYDRODIURIL) 25 MG tablet, Take 25 mg by mouth, Disp: , Rfl:   .  HYDROcodone-acetaminophen (NORCO 10-325) 10-325 MG per tablet, Take 1 tablet by mouth every 4 (four) hours as needed, Disp: , Rfl:   .  insulin glargine (BASAGLAR KWIKPEN) 100 UNIT/ML injection pen, INJECT 10 UNITS INTO THE SKIN AT BEDTIME.IF BLOOD SUGAR <200 INCREASE BY 2 UNITS MAX OF 20 UNITS, Disp: , Rfl:   .  Insulin Pen Needle (BD PEN NEEDLE NANO U/F) 32G X 4 MM Misc, USE AS DIRECTED DAILY, Disp: , Rfl:   .  Insulin Pen Needle 31G X 4 MM Misc, 1 Units by  Does not apply route, Disp: , Rfl:   .  isosorbide mononitrate (IMDUR) 30 MG 24 hr tablet, Take 30 mg by mouth, Disp: , Rfl:   .  Lancets Misc, USE TO CHECK BLOOD SUGARS UP TO THREE TIMES DAILY, Disp: , Rfl:   .  Lancets Misc, USE TO CHECK BLOOD SUGARS UP TO THREE TIMES DAILY, Disp: , Rfl:   .  lidocaine (LIDODERM) 5 %, , Disp: , Rfl:   .  metFORMIN (GLUCOPHAGE) 1000 MG tablet, Take 1,000 mg by mouth, Disp: , Rfl:   .  metoprolol succinate (TOPROL-XL) 100 MG 24 hr tablet, Take 100 mg by mouth, Disp: , Rfl:   .  morphine (MSIR) 15 MG immediate release tablet, Take 15 mg by mouth, Disp: , Rfl:   .  simvastatin (ZOCOR) 40 MG tablet, Take 40 mg by mouth, Disp: , Rfl:   .  tiZANidine (ZANAFLEX) 4 MG tablet, Take 4 mg by mouth, Disp: , Rfl:   .  triazolam (HALCION) 0.125 MG tablet, Take 0.125 mg by mouth, Disp: , Rfl:   .  escitalopram (LEXAPRO) 20 MG tablet, Take 40 mg by mouth, Disp: , Rfl:   .  LORazepam (ATIVAN) 2 MG tablet, Take 2 mg by mouth, Disp: , Rfl:     Not on File    Medications and Allergies reviewed.    PHYSICAL EXAM     Vitals:    06/09/18 1402   BP: 94/65   Pulse: 71   Resp: 16   Temp: (!) 96.8 F (36 C)   TempSrc: Tympanic   SpO2: 97%   Weight: 94.3 kg (208 lb)   Height: 1.6 m (5\' 3" )       Physical Exam   Constitutional: She is oriented to person, place, and time. She appears well-developed and well-nourished. No distress.   HENT:   Head: Normocephalic and atraumatic.   Eyes: Conjunctivae are normal. No scleral icterus.   Neck: No JVD present.   Cardiovascular: Normal rate, regular rhythm, normal heart sounds and intact distal pulses.   No murmur heard.  Pulmonary/Chest: Effort normal and breath sounds normal. No respiratory distress. She has no wheezes. She has no rales.   Abdominal: Soft. There is no tenderness.   Musculoskeletal:  General: No edema.      Left shoulder: Normal.      Left elbow: She exhibits normal range of motion, no swelling, no effusion, no deformity and no laceration.  Tenderness found. Medial epicondyle tenderness noted.      Left wrist: Normal.      Cervical back: Normal.      Thoracic back: Normal.   Neurological: She is alert and oriented to person, place, and time.   Skin: Skin is warm and dry. No rash noted. She is not diaphoretic. No erythema. No pallor.   Psychiatric: She has a normal mood and affect. Her behavior is normal.         UCC COURSE       Results     ** No results found for the last 24 hours. **            No results found.      No orders of the defined types were placed in this encounter.        PROCEDURES     EKG Read  Date/Time: 06/09/2018 2:55 PM  Performed by: Rometta Emery, FNP  Authorized by: Rometta Emery, FNP     Previous ECG:     Previous ECG:  Unavailable  Interpretation:     Interpretation: abnormal    Rate:     ECG rate:  72  Comments:      Vertical axis, high lateral repolarization disturbance, consider ischemai, lv overload.  Minimal right precordial repolarization disturbance  Long QT interval           ASSESSMENT     Encounter Diagnosis   Name Primary?   . Left arm pain Yes          SSESSMENT    PLAN      Pt to [facility], multiple risk factors for cardiac etiology.  Abnormal ECG        Discussed results and diagnosis with patient/family.  Reviewed warning signs for worsening condition, as well as, indications for follow-up with pmd and return to urgent care clinic.   Patient/family expressed understanding of instructions.    Orders Placed This Encounter   Procedures   . Task for ECG   . EKG Read         An After Visit Summary was printed and given to the patient.      Signed,  Rometta Emery, FNP

## 2018-06-09 NOTE — Patient Instructions (Signed)
[facility]

## 2018-07-25 NOTE — Progress Notes (Signed)
NEUROSURGERY ATTENDING - CONSULTATION NOTE      Date: 08/01/2018  Patient Name: [name],[name]    Please CC and send a report to:  Patient Care Team:  Pcp, None, MD as PCP - General    Diagnosis / Chief Complaint:     Neck pain radiating into the bilateral upper extremities with numbness in the left hand.    History of Present Illness:     I was asked by Dr. Levonne Hubert to see Shirl [name] in consultation in order to render my professional opinion regarding the aforementioned chief complaint.    Jaimy Kliethermes is a 48 y.o. right-handed female with a history of type 2 DM and HTN who presents with an approximately 68-month history of neck pain radiating into the bilateral upper extremities with numbness in the left hand after moving furniture and boxes back in October 2019.  The patient reports that the pain is in both arms, elbows, hands and the fingers in her left hand are numb.  She reports aggravating factors include lying down and typing.  Heat helps to reduce her symptoms.    She has tried Vicodin and Nycenta which helps her pain.  She is also tried Flexeril.  Bed rest worsens her pain.  She has not undergone any physical therapy, exercise or injections.  She denies a previous history of spine surgery.    She rates the pain in her neck a 5/10.  Right arm pain is rated a 3/10.  Left arm pain is rated a 7/10 usually but today is a 5/10.  Pain in her upper extremities is located in the triceps, forearms and affects the last 3 fingers in the left hand.  Percent of her pain is located in the neck versus 80% in the upper extremities.  Pain worsens with lifting and position of her body.  Pain improves with heat.    Low back pain is rated a 4/10 usually but today is a 6/10.  Right leg pain is rated a 5/10 .  She denies any left leg pain.  She reports that the pain is located in her knees bilaterally and a band formation.  She also has burning in the tops of her feet bilaterally.  80% of her pain is located low  back versus 20% in the lower extremities.  Pain is constant and she denies any factors that worsen.  Pain improves usually with heat.      She reports weakness in the left hand and fingers since early November 2019.  She reports clumsiness in the hands bilaterally noting that she is dropping things and has difficulty opening jars.  She reports difficulty with balance reporting falls and feelings of dizziness.  She denies any difficulty walking long distances.  She denies any current bowel or bladder dysfunction but does note one incidence of bowel incontinence back in mid December 2019.  She denies any difficulty with sexual functioning.  She reports that her sleep is affected by her condition.    History was obtained from chart review and the patient.      Review of Systems:     A comprehensive review of systems was performed and revealed the following:    Constitutional: fatigue, change in activity -negative for - fever, chills, sweating   General: headaches -negative for - recent weight gain, recent weight loss, appetite change  Eyes: blurry vision -negative for - double vision, eye pain, light sensitivity, eye discharge  ENT: negative for - hearing loss, nasal discharge, hoarseness,  sore throat, swallowing difficulty  Heart: negative for - chest pain or tightness, palpitations, fainting, other heart trouble  Lungs: sleep apnea -negative for - chronic cough, shortness of breath, wheezing, hemoptysis  Gastrointestinal: nausea, constipation -negative for - abdominal pain, vomiting, diarrhea, bloody stool  Genito-Urinary: urinary urgency -negative for - urinary retention, urinary incontinence, urinary discharge  Musculoskeletal: joint pain -negative for - joint swelling, leg swelling, leg cramping, fractures  Neurological: dizziness -negative for - tremors, memory loss, speech difficulty, seizures  Hematological and Lymphatic: negative for - easy bleeding, easy bruising, swollen nodes  Skin: infection -negative for  - rash, enlarged glands, moles or skin lesions, color change   Psychaitric: anxiety, depression, confusion, excessive stress -negative for - suicidal thoughts        Color Code  Yellow - Numbness sensation  Purple - Pins & Needles sensation  Red     - Burning sensation  Blue    - Aching pain  Green Lambert Mody and Stabbing pain  [employer] - Other    Past Medical History:     Past Medical History:   Diagnosis Date    Diabetes mellitus     Hypertension      Past Surgical History:     Past Surgical History:   Procedure Laterality Date    BREAST SURGERY      COSMETIC SURGERY       Family History:     Family History   Problem Relation Age of Onset    Hyperlipidemia Mother     Alcohol abuse Father     Hyperlipidemia Father      Social History:     Social History     Socioeconomic History    Marital status: Single     Spouse name: None    Number of children: None    Years of education: None    Highest education level: None   Occupational History    None   Social Engineer, site strain: None    Food insecurity:     Worry: None     Inability: None    Transportation needs:     Medical: None     Non-medical: None   Tobacco Use    Smoking status: Current Every Day Smoker    Smokeless tobacco: Never Used   Substance and Sexual Activity    Alcohol use: Never     Frequency: Never    Drug use: Never    Sexual activity: None   Lifestyle    Physical activity:     Days per week: None     Minutes per session: None    Stress: None   Relationships    Social connections:     Talks on phone: None     Gets together: None     Attends religious service: None     Active member of club or organization: None     Attends meetings of clubs or organizations: None     Relationship status: None    Intimate partner violence:     Fear of current or ex partner: None     Emotionally abused: None     Physically abused: None     Forced sexual activity: None   Other Topics Concern    None   Social History Narrative    None      Ms. Motter is single. She has 2 children. She lives alone. She works in [employer]. 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 She denies alcohol use. She smokes 1.5 ppd for the past 30 years. She denies recreational drug use.     Allergies:     No Known Allergies    Medications:     Current Outpatient Medications on File Prior to Visit   Medication Sig Dispense Refill    Armodafinil 150 MG tablet Take 150 mg by mouth      Blood Glucose Monitoring Suppl (GLUCOCOM BLOOD GLUCOSE MONITOR) Device USE TO CHECK BLOOD SUGARS UP TO THREE TIMES DAILY      Blood Glucose Monitoring Suppl (GLUCOCOM BLOOD GLUCOSE MONITOR) Device USE TO CHECK BLOOD SUGARS UP TO THREE TIMES DAILY      escitalopram (LEXAPRO) 20 MG tablet Take 40 mg by mouth      glipiZIDE XL (GLUCOTROL XL) 5 MG 24 hr tablet TAKE 1 TABLET (5 MG TOTAL) BY MOUTH DAILY.      glucose blood (ONETOUCH VERIO) test strip Check blood sugars 3 times daily.   Diagnosis: Uncontrolled Type 2 DM      glucose blood (ONETOUCH VERIO) test strip       glucose blood test strip 1 each by Other route      hydroCHLOROthiazide (HYDRODIURIL) 25 MG tablet Take 25 mg by mouth      HYDROcodone-acetaminophen (NORCO 10-325) 10-325 MG per tablet Take 1 tablet by mouth every 4 (four) hours as needed      insulin glargine (BASAGLAR KWIKPEN) 100 UNIT/ML injection pen INJECT 10 UNITS INTO THE SKIN AT BEDTIME.IF BLOOD SUGAR <200 INCREASE BY 2 UNITS MAX OF 20 UNITS      Insulin Pen Needle (BD PEN NEEDLE NANO U/F) 32G X 4 MM Misc USE AS DIRECTED DAILY      Insulin Pen Needle 31G X 4 MM Misc 1 Units by Does not apply route      isosorbide mononitrate (IMDUR) 30 MG 24 hr tablet Take 30 mg by mouth      Lancets Misc USE TO CHECK BLOOD SUGARS UP TO THREE TIMES DAILY      Lancets Misc USE TO CHECK BLOOD SUGARS UP TO THREE TIMES DAILY      lidocaine (LIDODERM) 5 %       LORazepam (ATIVAN) 2 MG tablet Take 2 mg by mouth      metFORMIN (GLUCOPHAGE) 1000 MG tablet Take  1,000 mg by mouth      metoprolol succinate (TOPROL-XL) 100 MG 24 hr tablet Take 100 mg by mouth      morphine (MSIR) 15 MG immediate release tablet Take 15 mg by mouth      simvastatin (ZOCOR) 40 MG tablet Take 40 mg by mouth      tiZANidine (ZANAFLEX) 4 MG tablet Take 4 mg by mouth      triazolam (HALCION) 0.125 MG tablet Take 0.125 mg by mouth       No current facility-administered medications on file prior to visit.      Vital Signs:     Vitals:    08/01/18 0910   BP: 118/80   Pulse: 69     Physical Exam:     General: No acute distress, cooperative with examination  Psychologic: Affect appropriate, judgment and insight consistent with situation, no delusions or hallucinations  Skin: Warm, dry, no obvious lesions or scars  Eyes: Sclerae anicteric, no conjunctival injection  ENT: No visible otorrhea, no rhinorrhea, trachea midline  Head: Normocephalic  Neck: No palpable masses  Musculoskeletal: Full ROM, normal  muscle tone, no atrophy  Pulmonary: Normal respiratory effort, no audible wheezing  Cardiovascular: No pedal edema, pulses 2+ in bilat lower extremities  Abdominal: Non-tender to palpation, non-distended, no organomegaly, no palpable masses    Neuro exam:   Awake, alert, orientedx3  Speech clear and fluent  Attention span normal  PERRL, EOMI  Facial sensation intact  Face symmetric  Hearing intact  Tongue midline  Shoulder shrug strong bilaterally  Motor:   Arms:      Deltoid  Bicep Tricep Grip IO   Right 5 5 5 5 5    Left  5 5 5 5 5        Legs:      HF KE KF DF PF EHL   Right 5 5 5 5 5 5    Left  5 5 5 5 5 5      No pronator drift  No dysmetria  Light touch and pinprick intact in all 4 extremities except decreased to light touch in the 3rd through 5th digits of the left hand.  DTRs:      Biceps Triceps Brachiorad Patellar Ankle   Right 2+ 2+ 2+ 4+ 2+   Left  2+ 2+ 2+ 4+ 2+     No Hoffmann's sign bilaterally.  No Clonus bilaterally.   Gait normal  unable to tandem gait  able to toe and heel walk    Labs:      No results found for: WBC, HGB, HCT, MCV, PLT  No results found for: NA, K, CL, CO2  No results found for: INR, PT  No results found for: BUN  No results found for: CREAT    Imaging:     I reviewed the patient's imaging myself. 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  There is also bilateral neuroforaminal stenosis at these levels.  There are smaller disc herniations at C4-C5 and C7-T1, with mass-effect on the thecal sac but no severe compression of the spinal cord.  There is intrinsic T2 cord signal changes within the spinal cord indicating spinal cord damage.      Assessment:     48 y.o. female presenting with symptoms of myelopathy, as well as bilateral arm pain and burning and numbness.  She is myelopathic on examination with evidence of weakness in her left hand.  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      Plan:     I had an extensive discussion with the patient regarding the condition. I showed her the imaging, and went over its findings in detail. I explained to the patient that she has large disc herniations in her neck causing significant compression of the spinal cord.  I also showed her on the imaging that she already has evidence of spinal cord damage.  I explained to her that compression of the spinal cord is responsible for her symptoms of myelopathy.  I also explained to her that compression of the nerves in the neck is responsible for her bilateral arm pain and burning and numbness.  I explained to the patient that my strong recommendation is surgical decompression of the spinal cord and the nerve roots, as well as anterior instrumented fusion of her cervical spine.  I stressed to the patient that the main goal of surgery is to prevent her symptoms from  worsening, but that there  is no guarantee that her symptoms of myelopathy will improve after surgery.    I explained to the patient the risks of surgical intervention, which include but are not limited to death, infection, bleeding, spinal cord injury, nerve injury, weakness, paresthesias, chronic pain, bowel/bladder dysfunction, hoarseness, difficulty with swallowing, CSF leak, pseudomeningocele formation, vascular injury, heart/lung complications from anesthesia, failure of surgery, failure of instrumentation, need for another surgery in the future. The patient voiced good understanding of these risks and elected to proceed with surgery. All questions were answered.    Thank you for the opportunity of allowing me to participate in the care of Ms. Elzina Devera.      Hoyle Sauer. Florean Hoobler, MD  Minimally Invasive and Complex Spine Surgery & General Neurosurgery  Department of Neurosciences   [facility]  Assistant Professor of Neurosurgery   [facility] Address: [address]   [place], [place] [address]  Office Phone [phone]   Appointments [phone]   Fax [phone]

## 2018-08-01 ENCOUNTER — Encounter (INDEPENDENT_AMBULATORY_CARE_PROVIDER_SITE_OTHER): Payer: Self-pay

## 2018-08-01 ENCOUNTER — Encounter (INDEPENDENT_AMBULATORY_CARE_PROVIDER_SITE_OTHER): Payer: Self-pay | Admitting: Neurological Surgery

## 2018-08-01 ENCOUNTER — Ambulatory Visit (INDEPENDENT_AMBULATORY_CARE_PROVIDER_SITE_OTHER): Payer: BC Managed Care – PPO | Admitting: Neurological Surgery

## 2018-08-01 VITALS — BP 118/80 | HR 69 | Ht 63.0 in | Wt 205.0 lb

## 2018-08-01 DIAGNOSIS — G9520 Unspecified cord compression: Secondary | ICD-10-CM

## 2018-08-01 DIAGNOSIS — M4802 Spinal stenosis, cervical region: Secondary | ICD-10-CM

## 2018-08-01 DIAGNOSIS — M5000 Cervical disc disorder with myelopathy, unspecified cervical region: Secondary | ICD-10-CM

## 2018-08-01 NOTE — Progress Notes (Signed)
A comprehensive review of systems was performed and revealed the following:    Constitutional: fatigue, change in activity -negative for - fever, chills, sweating   General: headaches -negative for - recent weight gain, recent weight loss, appetite change  Eyes: blurry vision -negative for - double vision, eye pain, light sensitivity, eye discharge  ENT: negative for - hearing loss, nasal discharge, hoarseness, sore throat, swallowing difficulty  Heart: negative for - chest pain or tightness, palpitations, fainting, other heart trouble  Lungs: sleep apnea -negative for - chronic cough, shortness of breath, wheezing, hemoptysis  Gastrointestinal: nausea, constipation -negative for - abdominal pain, vomiting, diarrhea, bloody stool  Genito-Urinary: urinary urgency -negative for - urinary retention, urinary incontinence, urinary discharge  Musculoskeletal: joint pain -negative for - joint swelling, leg swelling, leg cramping, fractures  Neurological: dizziness -negative for - tremors, memory loss, speech difficulty, seizures  Hematological and Lymphatic: negative for - easy bleeding, easy bruising, swollen nodes  Skin: infection -negative for - rash, enlarged glands, moles or skin lesions, color change   Psychaitric: anxiety, depression, confusion, excessive stress -negative for - suicidal thoughts    All other systems were reviewed by me and are negative.

## 2018-08-03 ENCOUNTER — Encounter (INDEPENDENT_AMBULATORY_CARE_PROVIDER_SITE_OTHER): Payer: Self-pay | Admitting: Neurological Surgery

## 2018-08-04 ENCOUNTER — Encounter (INDEPENDENT_AMBULATORY_CARE_PROVIDER_SITE_OTHER): Payer: Self-pay | Admitting: Neurological Surgery

## 2018-08-07 ENCOUNTER — Telehealth: Payer: Self-pay | Admitting: Family Medicine

## 2018-08-07 ENCOUNTER — Ambulatory Visit
Admission: RE | Admit: 2018-08-07 | Discharge: 2018-08-07 | Disposition: A | Discharge status: Home / Self Care | Payer: BC Managed Care – PPO | Source: Ambulatory Visit | Attending: Neurological Surgery | Admitting: Neurological Surgery

## 2018-08-07 ENCOUNTER — Encounter: Payer: Self-pay | Admitting: Nurse Practitioner

## 2018-08-07 ENCOUNTER — Other Ambulatory Visit: Payer: Self-pay | Admitting: Neurological Surgery

## 2018-08-07 ENCOUNTER — Ambulatory Visit: Payer: BC Managed Care – PPO

## 2018-08-07 ENCOUNTER — Other Ambulatory Visit (INDEPENDENT_AMBULATORY_CARE_PROVIDER_SITE_OTHER): Payer: Self-pay | Admitting: Neurological Surgery

## 2018-08-07 DIAGNOSIS — F17200 Nicotine dependence, unspecified, uncomplicated: Secondary | ICD-10-CM

## 2018-08-07 DIAGNOSIS — M5020 Other cervical disc displacement, unspecified cervical region: Secondary | ICD-10-CM

## 2018-08-07 DIAGNOSIS — L732 Hidradenitis suppurativa: Secondary | ICD-10-CM

## 2018-08-07 LAB — URINALYSIS, REFLEX TO MICROSCOPIC EXAM IF INDICATED
Bilirubin, UA: NEGATIVE
Blood, UA: NEGATIVE
Glucose, UA: >=1000 — AB
Ketones UA: NEGATIVE
Leukocyte Esterase, UA: NEGATIVE
Nitrite, UA: NEGATIVE
Protein, UR: NEGATIVE
Specific Gravity UA: 1.029 (ref 1.001–1.035)
Urine Bacteria: NONE SEEN /hpf
Urine pH: 5.5 (ref 5.0–8.0)
Urobilinogen, UA: 0.2 (ref 0.2–2.0)

## 2018-08-07 LAB — HEMOLYSIS INDEX: Hemolysis Index: 6 (ref 0–18)

## 2018-08-07 LAB — CBC
Absolute NRBC: 0 10*3/uL (ref 0.00–0.00)
Hematocrit: 41.1 % (ref 34.7–43.7)
Hgb: 13.6 g/dL (ref 11.4–14.8)
MCH: 31.6 pg (ref 25.1–33.5)
MCHC: 33.1 g/dL (ref 31.5–35.8)
MCV: 95.6 fL (ref 78.0–96.0)
MPV: 10.1 fL (ref 8.9–12.5)
Nucleated RBC: 0 /100 WBC (ref 0.0–0.0)
Platelets: 349 10*3/uL — ABNORMAL HIGH (ref 142–346)
RBC: 4.3 10*6/uL (ref 3.90–5.10)
RDW: 14 % (ref 11–15)
WBC: 10.06 10*3/uL — ABNORMAL HIGH (ref 3.10–9.50)

## 2018-08-07 LAB — BASIC METABOLIC PANEL
BUN: 11 mg/dL (ref 7.0–19.0)
CO2: 28 mEq/L (ref 21–29)
Calcium: 9.4 mg/dL (ref 8.5–10.5)
Chloride: 92 mEq/L — ABNORMAL LOW (ref 100–111)
Creatinine: 1.4 mg/dL (ref 0.4–1.5)
Glucose: 455 mg/dL — ABNORMAL HIGH (ref 70–100)
Potassium: 3.9 mEq/L (ref 3.5–5.1)
Sodium: 132 mEq/L — ABNORMAL LOW (ref 136–145)

## 2018-08-07 LAB — PT AND APTT
PT INR: 1 (ref 0.9–1.1)
PT: 12.9 s (ref 12.6–15.0)
PTT: 29 s (ref 23–37)

## 2018-08-07 LAB — GFR: EGFR: 40.1

## 2018-08-07 LAB — HEMOGLOBIN A1C
Average Estimated Glucose: 223.1 mg/dL
Hemoglobin A1C: 9.4 % — ABNORMAL HIGH (ref 4.6–5.9)

## 2018-08-07 LAB — TYPE AND SCREEN
AB Screen Gel: NEGATIVE
ABO Rh: O POS

## 2018-08-07 LAB — HCG QUANTITATIVE: hCG, Quant.: 1.4 (ref 0.0–4.9)

## 2018-08-07 NOTE — H&P (Signed)
PREOP HISTORY AND PHYSICAL EXAM    Patient Name: [name],[name]  Surgeon(s):  Lavetta Geier, Hoyle Sauer, MD       Assessment:     48 yo female with history of diastolic HF, IDDM, HTN, hyperlipidemia, OSA, narcolepsy, depression, hydradenitis with abscess, fibromyalgia, smoker, now here with a history of cervical disc herniation presents for pre op medical evaluation prior to surgery.  C5-C7 ACDF surgery has been scheduled with Dr. [name] for 08/09/2018 at [facility].      Patient has 11% RCRI in history and adequate functional capacity (METS 5), and is ASA class 3 with elevated BMI 36.31.  Based on ACC/AHA 2014 guidelines, no additional CV testing is needed.  Patient is at acceptable risk to proceed with planned procedure.  Pt has OSA and uses CPAP nightly.  11/2015 Stress echo, EF 55%, negative for inducible ischemia.  12/2015 Left heart cath with mild non-obstructive ASCAD - results in Care Everywhere in Epic.    Pre op evaluation:     No cardiac symptoms     Functional capacity is 5 Mets     Diastolic HF:  No history of acute episode of CHF, but was told she has CHF per pt.  Heart cath 2017 suggests diastolic HF.  Pt unsure why she is taking Imdur, no history of angina per pt.     IDDM:  Optimized on Basaglar, metformin, glipizide daily.  No glucose checks at home.     Hypertension:  Optimized on HCTZ, metoprolol daily.     Hyperlipidemia:  Optimized on simvastatin daily.     OSA/ narcolepsy:  CPAP use nightly.  Optimized on armodafinil.       Depression:  Optimized on lexapro daily, lorazepam daily as needed.     Hidradenitis:  Current left buttock abscess.     Fibromyalgia:  Optimized on nucynta BID.     Current smoker:  45 pack years.  Smokes 1.5 packs per day.     Exercise:  None.      Patient denies any exertional chest pain, palpitations, dizziness, lightheadedness, syncope and claudication.  She has DOE with exertion.  She is afebrile.    Patients medical and surgical history, medication  reconciliation and family history were reviewed as well as a complete physical exam was performed.  Recent laboratory results were obtained, including wound culture.  EKG was done 06/09/18 and is NSR with ventricular rate of 72.      Date Time: 08/07/18  2:33 PM  Plan:     Impression/Plan:  -Encouraged to stop smoking to avoid pulmonary complications and increased risk of infection.  Pt declined smoking cessation.  -I have sent the following lab testing, CBC, BMP, UA, PT/INR, T&S, MRSA/MSSA, wound culture, and EKG testing as requested on patients preoperative evaluation form.  -Pre-op evaluation  ok to proceed with high risk surgery, pending labs, and EKG results.  -Hold ASA, vitamins, supplements, NSAIDS, etc. 7-10 days prior to procedure.  -Discussed the importance of addressing and managing traditional modifiable cardiac risk factors (lipids, blood pressure, smoking, obesity, sedentary lifestyle) in order to reduce the risk of cardiovascular disease, CVD (heart attacks, heart failure, strokes, and peripheral arterial disease).  -Discussed with patient the importance of achieving and maintaining targeted glycemic control in order to reduce the risk of diabetes related complications such as eye disease, kidney disease, nerve damage, and amputation of the limbs as well as perioperative care management to reduce the risk of infection and long hospital stay  -Discussed  the importance of patients active involvement in diabetes care (attending diabetes education classes and or individual education sessions, monitoring blood glucose levels before and after meals and keeping a log of self-monitored blood glucose, SMBG, taking prescribed medications on time, eating a healthy diabetic diet, and engaging in regular physical activity as tolerated).  -Hibiclens and instructions on proper use given to pt.  -NPO after MN DOS.  -Basaglar 20 units night before surgery.  Hold oral DM meds day before surgery.  -Take PM meds per  routine.  -Take gabapentin, nucynta DOS.  -Keep red arm band on through DOS  -Avoid cuts/scrapes to body prior to surgery  -Pt advised to find doctors locally - PCP, endocrinologist, cardiologist.  -Pt advised to check BS at home.  -Dr. [name] notified of abscess to left buttock.  Doxycyline ordered.    Addendum 08/08/18 - I spoke with Dr. Noah Charon and she recommended to postpone surgery due to abscess.  I called pt per Dr. [name]' request and LM - encouraged her to see ID for abscess, see PCP, see endocrinologist due to elevated glucose and HbA1C.  Check glucose QID at home.  I asked pt to return my call to further discuss plan of care.    History of Present Illness:     I had the pleasure of seeing [name] today for preoperative evaluation.  She is a 48 yo female with history of diastolic HF, IDDM, HTN, hyperlipidemia, OSA, narcolepsy, depression, hydradenitis with abscess, fibromyalgia, smoker, who is scheduled for C5-C7 ACDF surgery on 08/09/2018 at [facility] by Dr. [name].  Her neck, arm, hand pain and finger numbness is worsening and is affecting her quality of life.  She started having pain 05/2018 with new long commute.  She moved to the [place] area from [facility] 04/2018.  She went to urgent care 05/2018 and was sent to the [facility] for cardiac workup.  She states workup was negative and she was discharged home.  She started with left elbow pain that spread down to her hand and up her shoulder, accompanied by left fingers numbness.  She also has pain across her shoulders and down her right arm.  MRI confirmed cervical disc herniation.  She was initially seen by pain specialist, Dr. Levonne Hubert and then referred to Dr. [name].  She describes her pain as throbbing, shooting and burning with left hand weakness.  She does not feel that her balance is off, but she was unable to walk in a straight line at the doctor's office, per pt.  Her pain level was 8/10 but has improved to 2/10 with medication  use.  She does not ambulate with assistive device.  She indicated that non-surgical treatments failed to alleviate her pain and so unable to perform her usual activities of daily living and therefore will have it surgically repaired.    She denies any exertional discomfort to suggest angina.      Risk factors; no history of surgical complications, no easy bleeding tendency, no history of blood clots, no history of allergic reaction to anesthetic agents.    Current symptoms:  no unusual fatigue, normal appetite, no fever, no chest pain, no palpitations, no unexplained syncope, no shortness of breath, no reduction in exercise tolerance due to chest pain or dyspnea, no upper respiratory infection, no wheezing, no atypical bleeding, normal bowel function, no new dysuria/urinary symptoms but has underlying incontinence.  +left buttock abscess.    Procedures:     Procedure(s):  C5-C7 ACDF  Date of Procedure:   08/09/2018    Past Medical History:     Past Medical History:   Diagnosis Date    Calculus of kidney     CHF (congestive heart failure)     diastolic, per pt never had acute episode of CHF    Complication of anesthesia     almost chewed through her tongue and severe chills in past    Coronary artery disease     mild non-obstructive ASCAD on heart cath report 12/2015    Depression     Diabetes mellitus     Fibromyalgia     Hidradenitis     Hydradenitis     Hyperlipidemia     Hypertension     Malignant neoplasm of skin     basal/ squamos    Neuropathy     Type 2 diabetes mellitus, controlled        Past Surgical History:     Past Surgical History:   Procedure Laterality Date    BREAST SURGERY  1998    reduction x2    CARDIAC SURGERY  12/2015    L heart cath, diagnosis mild non-obstrutive ASCAD    CESAREAN SECTION  2008    with tummy tuck    COSMETIC SURGERY  1990s,2000s    thigh 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    HAND SURGERY Right     x3, cyst    TONSILLECTOMY         Family History:      Family History   Problem Relation Age of Onset    Hyperlipidemia Mother     Alcohol abuse Father     Hyperlipidemia Father        Social History:     Social History     Socioeconomic History    Marital status: Single     Spouse name: Not on file    Number of children: Not on file    Years of education: Not on file    Highest education level: Not on file   Occupational History    Not on file   Social Needs    Financial resource strain: Not on file    Food insecurity:     Worry: Not on file     Inability: Not on file    Transportation needs:     Medical: Not on file     Non-medical: Not on file   Tobacco Use    Smoking status: Current Every Day Smoker     Packs/day: 1.50     Years: 30.00     Pack years: 45.00    Smokeless tobacco: Never Used   Substance and Sexual Activity    Alcohol use: Never     Frequency: Never    Drug use: Never    Sexual activity: Not on file   Lifestyle    Physical activity:     Days per week: Not on file     Minutes per session: Not on file    Stress: Not on file   Relationships    Social connections:     Talks on phone: Not on file     Gets together: Not on file     Attends religious service: Not on file     Active member of club or organization: Not on file     Attends meetings of clubs or organizations: Not on file     Relationship status: Not on file    Intimate  partner violence:     Fear of current or ex partner: Not on file     Emotionally abused: Not on file     Physically abused: Not on file     Forced sexual activity: Not on file   Other Topics Concern    Not on file   Social History Narrative    Not on file       Allergies:     Allergies   Allergen Reactions    Wellbutrin [Bupropion] Other (See Comments)     tachycardia    Enablex [Darifenacin] Rash    Lyrica [Pregabalin] Rash    Vesicare [Solifenacin] Rash       Medications:   No current facility-administered medications for this encounter.     Current Outpatient Medications:     aspirin EC 81 MG EC  tablet, Take 81 mg by mouth nightly, Disp: , Rfl:     Cholecalciferol (VITAMIN D3) 2000 UNIT capsule, Take 2,000 Units by mouth nightly, Disp: , Rfl:     gabapentin (NEURONTIN) 300 MG capsule, Take 300 mg by mouth 3 (three) times daily, Disp: , Rfl:     vitamin E 1000 UNIT capsule, Take 1,000 Units by mouth nightly, Disp: , Rfl:     Armodafinil 150 MG tablet, Take 150 mg by mouth, Disp: , Rfl:     Blood Glucose Monitoring Suppl (GLUCOCOM BLOOD GLUCOSE MONITOR) Device, USE TO CHECK BLOOD SUGARS UP TO THREE TIMES DAILY, Disp: , Rfl:     doxycycline (MONODOX) 100 MG capsule, Take 100 mg by mouth 2 (two) times daily Bid for first day, then daily, Disp: , Rfl:     escitalopram (LEXAPRO) 20 MG tablet, Take 40 mg by mouth nightly  , Disp: , Rfl:     glipiZIDE XL (GLUCOTROL XL) 5 MG 24 hr tablet, Take 5 mg by mouth nightly  , Disp: , Rfl:     glucose blood (ONETOUCH VERIO) test strip, Check blood sugars 3 times daily.   Diagnosis: Uncontrolled Type 2 DM, Disp: , Rfl:     glucose blood test strip, 1 each by Other route, Disp: , Rfl:     hydroCHLOROthiazide (HYDRODIURIL) 25 MG tablet, Take 25 mg by mouth nightly  , Disp: , Rfl:     insulin glargine (BASAGLAR KWIKPEN) 100 UNIT/ML injection pen, Inject 42 Units into the skin nightly  , Disp: , Rfl:     Insulin Pen Needle (BD PEN NEEDLE NANO U/F) 32G X 4 MM Misc, USE AS DIRECTED DAILY, Disp: , Rfl:     isosorbide mononitrate (IMDUR) 30 MG 24 hr tablet, Take 30 mg by mouth nightly  , Disp: , Rfl:     Lancets Misc, USE TO CHECK BLOOD SUGARS UP TO THREE TIMES DAILY, Disp: , Rfl:     lidocaine (LIDODERM) 5 %, Place 1 patch onto the skin daily as needed  , Disp: , Rfl:     LORazepam (ATIVAN) 2 MG tablet, Take 2 mg by mouth daily as needed  , Disp: , Rfl:     metFORMIN (GLUCOPHAGE) 1000 MG tablet, Take 1,000 mg by mouth 2 (two) times daily with meals  , Disp: , Rfl:     metoprolol succinate (TOPROL-XL) 100 MG 24 hr tablet, Take 100 mg by mouth nightly  , Disp: ,  Rfl:     NUCYNTA 50 MG Tab tablet, Take 50 mg by mouth 2 (two) times daily  , Disp: , Rfl:     NUCYNTA ER  150 MG Tablet SR 12 hr, Take 150 mg by mouth 2 (two) times daily  , Disp: , Rfl:     simvastatin (ZOCOR) 40 MG tablet, Take 40 mg by mouth nightly  , Disp: , Rfl:     tiZANidine (ZANAFLEX) 4 MG tablet, Take 4 mg by mouth daily as needed  , Disp: , Rfl:     triazolam (HALCION) 0.125 MG tablet, Take 0.125 mg by mouth nightly as needed  , Disp: , Rfl:     Review of Systems:     Review of Systems   Constitutional: Positive for diaphoresis.   HENT: Positive for tinnitus.         Bilat chronic   Eyes: Negative.    Respiratory: Negative.    Cardiovascular: Negative.  Negative for PND.   Gastrointestinal: Negative.    Genitourinary: Positive for frequency.   Musculoskeletal: Positive for falls and neck pain.        Fell in kitchen 06/2018, unsure how. No injury.   Skin:        Left buttock abscess   Neurological: Positive for dizziness, tingling and focal weakness.        Left arm/ hand weakness   Endo/Heme/Allergies: Negative.    Psychiatric/Behavioral: Positive for depression. The patient has insomnia.         Physical Exam:     Vitals:    08/07/18 1249   BP: 133/73   Pulse: 85   Resp: 16   Temp: 97.1 F (36.2 C)   SpO2: 97%   Ht: 5'3"; Wt: 205 lb (93 kg); BMI 36.31    Physical Exam  Constitutional:       Appearance: Normal appearance.   HENT:      Head: Normocephalic and atraumatic.      Right Ear: External ear normal.      Left Ear: External ear normal.      Nose: Nose normal.      Mouth/Throat:      Mouth: Mucous membranes are moist.      Pharynx: Oropharynx is clear.   Eyes:      Conjunctiva/sclera: Conjunctivae normal.   Neck:      Musculoskeletal: Normal range of motion and neck supple.   Cardiovascular:      Rate and Rhythm: Normal rate and regular rhythm.      Pulses: Normal pulses.      Heart sounds: Murmur present.      Comments: 2/6 murmur  Pulmonary:      Effort: Pulmonary effort is normal.       Breath sounds: Normal breath sounds.   Abdominal:      General: Bowel sounds are normal.      Palpations: Abdomen is soft.   Genitourinary:     Comments: deferred  Musculoskeletal: Normal range of motion.   Skin:     General: Skin is warm and dry.      Capillary Refill: Capillary refill takes less than 2 seconds.      Findings: Erythema present.      Comments: Left buttock with draining abscess, serosanguinos.  Warm to touch.  ~6cm in diameter.  Bruised in appearance.  No erythema to surrounding skin.   Neurological:      General: No focal deficit present.      Mental Status: She is alert and oriented to person, place, and time.   Psychiatric:         Mood and Affect: Mood normal.  Behavior: Behavior normal.         Erin P Bartko  08/07/2018

## 2018-08-07 NOTE — Telephone Encounter (Signed)
See note  Copied from CRM [id number]. Topic: General - Other >> Aug 07, 2018  9:57 AM Jaquita Rector A wrote: Reason for CRM: Cynda Acres from the [facility] called to say that patient will be having back surgery on 08/09/2018 and they would like copies of recent EKG, ECHO, Stress test last office visit notes and test results. Please send Ph# [phone] Fax# [phone] as soon as possible please.

## 2018-08-07 NOTE — Telephone Encounter (Signed)
There is not an echo or stress test on record for this patient.

## 2018-08-07 NOTE — Telephone Encounter (Signed)
I have faxed the most recent office note along with EKG and most recent labs (from 04/2017).

## 2018-08-08 ENCOUNTER — Encounter: Payer: Self-pay | Admitting: Nurse Practitioner

## 2018-08-08 LAB — PRESURGICAL SURVEILLANCE, MSSA+MRSA: Culture Staph and MRSA Surveillance: POSITIVE — AB

## 2018-08-09 ENCOUNTER — Ambulatory Visit
Admission: RE | Admit: 2018-08-09 | Payer: BC Managed Care – PPO | Source: Ambulatory Visit | Admitting: Neurological Surgery

## 2018-08-09 SURGERY — DISCECTOMY, ANTERIOR CERVICAL, FUSION, LEVELS 2
Anesthesia: General

## 2018-08-09 NOTE — Progress Notes (Signed)
I spoke with patient today who scheduled an appointment with a PCP today at 215pm, [facility].  I also gave her some names of ID doctors convenient to her work and asked to her schedule an appointment.  She refuses to see ID and stated she will handle the abscess at home like she always does with "home surgery."  I asked her to let us know when abscess heals and we can schedule surgery.  Dr. [name] made aware.  We discussed her glucose and she said she is not interested in checking her glucose at home.  We discussed the importance of checking her sugar and I asked her to at least check her fasting AM sugar and increase Basaglar by 1 unit daily until she reaches a fasting goal of 130.  We discussed blood glucose monitors such as Libre or Dexcom.  She said she will consider doing this and will consider seeing an endocrinologist.  She is a single mother of 2 children, has increased stress at her new job and no family in the area.    Positive for MSSA nares - mupirocin ointment prescribed and pt educated on proper use.

## 2018-08-11 ENCOUNTER — Other Ambulatory Visit (INDEPENDENT_AMBULATORY_CARE_PROVIDER_SITE_OTHER): Payer: Self-pay | Admitting: Physician Assistant

## 2018-08-11 DIAGNOSIS — E1165 Type 2 diabetes mellitus with hyperglycemia: Secondary | ICD-10-CM

## 2018-08-14 NOTE — Progress Notes (Signed)
Patient contacted me today stating she saw a surgeon 08/10/18 and had her abscess drained.  She will f/u with surgeon in 2 weeks.  She also states that her balance is deteriorating and pain increasing.  Dr. [name] made aware of this information and plans to see her in his office and 1 month and he will follow up with patient regarding her change in symptoms.

## 2018-09-04 NOTE — Progress Notes (Signed)
NEUROSURGERY ATTENDING - FOLLOW-UP NOTE      Date: 09/05/2018  Patient Name: [name]    Please CC and send a report to:  Patient Care Team:  Pcp, None, MD as PCP - General     Diagnosis / Chief Complaint:     Neck pain radiating into the bilateral upper extremities with numbness in the left hand.    History of Present Illness:     I had the pleasure of seeing [name] in follow-up today regarding the aforementioned chief complaint.     [name] is a 48 y.o. right-handed female  with a history of type 2 DM and HTN  who initially presented on 08/01/2018 with an approximately 29-month history of neck pain radiating into the bilateral upper extremities with numbness in the left hand after moving furniture and boxes back in October 2019.  Aggravating factors include lying down and typing.  Heat helps to reduce her symptoms.  She has tried medications and bedrest.  Associated symptoms include clumsiness in the hands and difficulty with balance.  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  She returns today to discuss surgery.    Today, the patient rates her pain a 4/10 located more in the neck compared to her arms bilaterally.  She reports that her symptoms have worsened since her last visit.  She continues to have numbness in all 5 fingers in the left hand however the numbness is worsening.  She is also starting to experience numbness in the right hand.  She reports stiffness in her neck making it difficult to turn her head.  Symptoms are worse at random and she notes that they are constant.  She states that taking the Nucynta extended release helps her pain the most.  She has taken hydrocodone in the past and used a heating pad which helps her pain.  She states that her pain is affecting her sleep.  She continues to smoke 1.5 packs/day of cigarettes.  She states that she has an appointment with her pain  management provider this afternoon.  She reports that she has discontinued taking gabapentin because she fears that she is experiencing a pruritic rash all over her body from the medication.  She has also discontinued the Nucynta instant release.    Of note: I spoke with patient's pain management provider, Hurshel Party, PA-C, phone number [phone]. She recommends for post operative pain control, continuing with Nucynta 150mg  bid and adding on Percocet for pain control.   -Harl Bowie, PA-C    History was obtained from chart review and the patient.      Review of Systems:     See HPI.         Color Code  Yellow - Numbness sensation  Purple - Pins & Needles sensation  Red     - Burning sensation  Blue    - Aching pain  Green Lambert Mody and Stabbing pain  [employer] - Other    Past Medical History:     Past Medical History:   Diagnosis Date    Calculus of kidney     CHF (congestive heart failure)     diastolic, per pt never had acute episode of CHF    Complication of anesthesia     almost chewed through her tongue and severe chills in past    Coronary artery disease     mild non-obstructive ASCAD on  heart cath report 12/2015    Depression     Diabetes mellitus     Fibromyalgia     Hidradenitis     Hydradenitis     Hyperlipidemia     Hypertension     Malignant neoplasm of skin     basal/ squamos    Neuropathy     Type 2 diabetes mellitus, controlled        Past Surgical History:     Past Surgical History:   Procedure Laterality Date    BREAST SURGERY  1998    reduction x2    CARDIAC SURGERY  12/2015    L heart cath, diagnosis mild non-obstrutive ASCAD    CESAREAN SECTION  2008    with tummy tuck    COSMETIC SURGERY  1990s,2000s    thigh 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    HAND SURGERY Right     x3, cyst    INCISION AND DRAINAGE OF WOUND      multiple I&D due to hidradenitis    TONSILLECTOMY         Family History:     Family History   Problem Relation Age of Onset    Hyperlipidemia Mother      Alcohol abuse Father     Hyperlipidemia Father        Social History:     Social History     Socioeconomic History    Marital status: Single     Spouse name: Not on file    Number of children: Not on file    Years of education: Not on file    Highest education level: Not on file   Occupational History    Not on file   Social Needs    Financial resource strain: Not on file    Food insecurity:     Worry: Not on file     Inability: Not on file    Transportation needs:     Medical: Not on file     Non-medical: Not on file   Tobacco Use    Smoking status: Current Every Day Smoker     Packs/day: 1.50     Years: 30.00     Pack years: 45.00    Smokeless tobacco: Never Used   Substance and Sexual Activity    Alcohol use: Never     Frequency: Never    Drug use: Never    Sexual activity: Not on file   Lifestyle    Physical activity:     Days per week: Not on file     Minutes per session: Not on file    Stress: Not on file   Relationships    Social connections:     Talks on phone: Not on file     Gets together: Not on file     Attends religious service: Not on file     Active member of club or organization: Not on file     Attends meetings of clubs or organizations: Not on file     Relationship status: Not on file    Intimate partner violence:     Fear of current or ex partner: Not on file     Emotionally abused: Not on file     Physically abused: Not on file     Forced sexual activity: Not on file   Other Topics Concern    Not on file   Social History Narrative    Not on file  Allergies:     Allergies   Allergen Reactions    Wellbutrin [Bupropion] Other (See Comments)     tachycardia    Enablex [Darifenacin] Rash    Lyrica [Pregabalin] Rash    Vesicare [Solifenacin] Rash       Medications:     Current Outpatient Medications on File Prior to Visit   Medication Sig Dispense Refill    Armodafinil 150 MG tablet Take 150 mg by mouth      aspirin EC 81 MG EC tablet Take 81 mg by mouth nightly       Blood Glucose Monitoring Suppl (GLUCOCOM BLOOD GLUCOSE MONITOR) Device USE TO CHECK BLOOD SUGARS UP TO THREE TIMES DAILY      Cholecalciferol (VITAMIN D3) 2000 UNIT capsule Take 2,000 Units by mouth nightly      doxycycline (MONODOX) 100 MG capsule Take 100 mg by mouth 2 (two) times daily Bid for first day, then daily      escitalopram (LEXAPRO) 20 MG tablet Take 40 mg by mouth nightly         gabapentin (NEURONTIN) 300 MG capsule Take 300 mg by mouth 3 (three) times daily      glipiZIDE XL (GLUCOTROL XL) 5 MG 24 hr tablet Take 5 mg by mouth nightly         glucose blood (ONETOUCH VERIO) test strip Check blood sugars 3 times daily.   Diagnosis: Uncontrolled Type 2 DM      glucose blood test strip 1 each by Other route      hydroCHLOROthiazide (HYDRODIURIL) 25 MG tablet Take 25 mg by mouth nightly         insulin glargine (BASAGLAR KWIKPEN) 100 UNIT/ML injection pen Inject 42 Units into the skin nightly         Insulin Pen Needle (BD PEN NEEDLE NANO U/F) 32G X 4 MM Misc USE AS DIRECTED DAILY      isosorbide mononitrate (IMDUR) 30 MG 24 hr tablet Take 30 mg by mouth nightly         Lancets Misc USE TO CHECK BLOOD SUGARS UP TO THREE TIMES DAILY      lidocaine (LIDODERM) 5 % Place 1 patch onto the skin daily as needed         LORazepam (ATIVAN) 2 MG tablet Take 2 mg by mouth daily as needed         metFORMIN (GLUCOPHAGE) 1000 MG tablet Take 1,000 mg by mouth 2 (two) times daily with meals         mupirocin (BACTROBAN) 2 % ointment Apply 1 g topically 2 (two) times daily BID x5 days for MSSA      NUCYNTA 50 MG Tab tablet Take 50 mg by mouth 2 (two) times daily         NUCYNTA ER 150 MG Tablet SR 12 hr Take 150 mg by mouth 2 (two) times daily         simvastatin (ZOCOR) 40 MG tablet Take 40 mg by mouth nightly         tiZANidine (ZANAFLEX) 4 MG tablet Take 4 mg by mouth daily as needed         triazolam (HALCION) 0.125 MG tablet Take 0.125 mg by mouth nightly as needed         vitamin E 1000 UNIT  capsule Take 1,000 Units by mouth nightly       No current facility-administered medications on file prior to visit.  Vital Signs:     Vitals:    09/05/18 1043   BP: 126/83   Pulse: 88       Physical Exam:     General: No acute distress, cooperative with examination  Psychologic: Affect appropriate, judgment and insight consistent with situation, no delusions or hallucinations  Skin: Warm, dry, no obvious lesions or scars  Eyes: Sclerae anicteric, no conjunctival injection  ENT: No visible otorrhea, no rhinorrhea, trachea midline  Head: Normocephalic  Neck: No palpable masses  Musculoskeletal: Full ROM, normal muscle tone, no atrophy  Pulmonary: Normal respiratory effort, no audible wheezing  Cardiovascular: No pedal edema, pulses 2+ in bilat lower extremities  Abdominal: Non-tender to palpation, non-distended, no organomegaly, no palpable masses    Neuro exam:   Awake, alert, orientedx3  Speech clear and fluent  Attention span normal  PERRL, EOMI  Facial sensation intact  Face symmetric  Hearing intact  Tongue midline  Shoulder shrug strong bilaterally  Motor:              Arms:     Deltoid  Bicep Tricep Grip IO   Right 5 5 5 5 5    Left  5 5 5 5 5                  Legs:     HF KE KF DF PF EHL   Right 5 5 5 5 5 5    Left  5 5 5 5 5 5      No pronator drift  No dysmetria  Light touch and pinprick intact in all 4 extremities except decreased to light touch in the hands diffusely bilaterally, worse on the left side. She also has decreased sensation in the feet bilaterally.   DTRs:     Biceps Triceps Brachiorad Patellar Ankle   Right 2+ 2+ 2+ 4+ 2+   Left  2+ 2+ 2+ 4+ 2+     Hoffman's sign bilaterally.   No Clonus bilaterally.   Gait normal  unable to tandem gait  able to toe and heel walk    Labs:     Lab Results   Component Value Date    WBC 10.06 (H) 08/07/2018    HGB 13.6 08/07/2018    HCT 41.1 08/07/2018    MCV 95.6 08/07/2018    PLT 349 (H) 08/07/2018     Lab Results   Component Value Date    NA 132  (L) 08/07/2018    K 3.9 08/07/2018    CL 92 (L) 08/07/2018    CO2 28 08/07/2018     Lab Results   Component Value Date    INR 1.0 08/07/2018    PT 12.9 08/07/2018     Lab Results   Component Value Date    BUN 11.0 08/07/2018     Lab Results   Component Value Date    CREAT 1.4 08/07/2018       Imaging:     My own interpretation of the MRI of the cervical spine without contrast is that it shows 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  There is also bilateral neuroforaminal stenosis at these levels.  There are smaller disc herniations at C4-C5 and C7-T1, with mass-effect on the thecal sac but no severe compression of the spinal cord.  There is intrinsic T2 cord signal changes within the spinal cord indicating spinal cord damage.      Assessment:     47  y.o. female presenting with symptoms of myelopathy, as well as bilateral arm pain and burning and numbness.  She is myelopathic on examination with evidence of weakness in her left hand.  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  The patient was scheduled for surgery with me for C5-6 and C6-C7 ACDF.  However, the patient was found to be very high risk for surgical intervention based on her medical clearance, both because of her severe hyperglycemia and her extensive smoking.      Plan:     I had an extensive discussion with the patient regarding the condition. I showed her again the imaging, and went over its findings in detail. I explained to the patient that certainly needs surgical intervention because of the very large disc herniation on her neck which is causing significant compression of the spinal cord, particularly that there is already evidence of spinal cord damage on imaging.  However, I explained to the patient that she is at very high risk for surgical complications, particularly infection and failure of the fusion because of her  diabetes and her extensive smoking.  I extensively counseled the patient on quitting smoking and on the fact that it can lead to severe complications following surgery.  I strongly recommended to the patient that we wait for 3 months while she is completely nicotine free and while she speaks with her primary care doctor and potentially with an endocrinologist to strictly control her hyperglycemia.  We agreed for her to come back and see me again in 2 months to reevaluate her condition and discuss surgery.  All questions were answered.    I spent over 30 minutes with the patient today and more than 50% of this time was spent in counseling and coordinating the patient's care.    Thank you for the opportunity of allowing me to participate in the care of [name].      Hoyle Sauer. Marja Adderley, MD  Minimally Invasive and Complex Spine Surgery & General Neurosurgery  Department of Neurosciences   [facility]  Assistant Professor of Neurosurgery   [facility] Address: [address]   [place], [place] [address]  Office Phone [phone]   Appointments [phone]   Fax [phone]

## 2018-09-05 ENCOUNTER — Encounter (INDEPENDENT_AMBULATORY_CARE_PROVIDER_SITE_OTHER): Payer: Self-pay

## 2018-09-05 ENCOUNTER — Ambulatory Visit (INDEPENDENT_AMBULATORY_CARE_PROVIDER_SITE_OTHER): Payer: BC Managed Care – PPO | Admitting: Neurological Surgery

## 2018-09-05 ENCOUNTER — Encounter (INDEPENDENT_AMBULATORY_CARE_PROVIDER_SITE_OTHER): Payer: Self-pay | Admitting: Neurological Surgery

## 2018-09-05 VITALS — BP 126/83 | HR 88 | Ht 63.0 in | Wt 205.0 lb

## 2018-09-05 DIAGNOSIS — G9520 Unspecified cord compression: Secondary | ICD-10-CM

## 2018-09-05 DIAGNOSIS — M4802 Spinal stenosis, cervical region: Secondary | ICD-10-CM

## 2018-09-05 DIAGNOSIS — M5000 Cervical disc disorder with myelopathy, unspecified cervical region: Secondary | ICD-10-CM

## 2018-09-12 ENCOUNTER — Encounter (INDEPENDENT_AMBULATORY_CARE_PROVIDER_SITE_OTHER): Payer: Self-pay | Admitting: Neurological Surgery

## 2018-09-12 ENCOUNTER — Ambulatory Visit (INDEPENDENT_AMBULATORY_CARE_PROVIDER_SITE_OTHER): Payer: BC Managed Care – PPO | Admitting: Neurological Surgery

## 2018-09-12 ENCOUNTER — Encounter (INDEPENDENT_AMBULATORY_CARE_PROVIDER_SITE_OTHER): Payer: Self-pay

## 2018-09-12 VITALS — BP 123/84 | HR 85 | Ht 63.0 in | Wt 205.0 lb

## 2018-09-12 DIAGNOSIS — M5000 Cervical disc disorder with myelopathy, unspecified cervical region: Secondary | ICD-10-CM

## 2018-09-12 DIAGNOSIS — M4802 Spinal stenosis, cervical region: Secondary | ICD-10-CM

## 2018-09-12 DIAGNOSIS — G9520 Unspecified cord compression: Secondary | ICD-10-CM

## 2018-09-12 NOTE — Progress Notes (Addendum)
NEUROSURGERY ATTENDING - FOLLOW-UP NOTE      Date: 09/12/2018  Patient Name: [name],[name]    Please CC and send a report to:  Patient Care Team:  Pcp, None, MD as PCP - General    Diagnosis / Chief Complaint:     Neck pain radiating into the bilateral upper extremities with numbness in the left hand.    History of Present Illness:     I had the pleasure of seeing [name] in follow-up today regarding the aforementioned chief complaint.    [name] is a 48 y.o. right-handed female with a history of type 2 DM and HTN who initially presented on 08/01/2018 with an approximately 13-month history of neck pain radiating into the bilateral upper extremities with numbness in the left hand after moving furniture and boxes back in October 2019.  Aggravating factors include lying down and typing.  Heat helps to reduce her symptoms.  She has tried medications and bedrest.  Associated symptoms include clumsiness in the hands and difficulty with balance.  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 Preparation for surgery has been complicated by uncontrolled diabetes. I last saw the patient on 09/05/2018. Patient returns today to discuss surgery.     Today, the patient rates her pain a 5/10.  She reports that she is getting very frustrated with the progression of her symptoms.  She reports that she fell over yesterday and hit her head, causing a "goose egg" on her right forehead.  She reports that her symptoms are affecting her everyday life.  She continues to have numbness in her fourth and fifth digits bilaterally, left worse than right.  She continues to have numbness in the top of her feet and toes bilaterally.  She reports there has been no interval changes since her last visit.  She continues to have falls and problems with balance.  She notes clumsiness in the hands bilaterally.  She denies any bowel or bladder  dysfunction.    Of note: I spoke with patient's pain management provider, Hurshel Party, PA-C, phone number [phone]. She recommends for post operative pain control, continuing with Nucynta ER 150mg  bid and adding on Percocet (1-2 5/325 mg every 6 hours PRN) for pain control. Patient has tolerated Morphine in the past. Allergies include Gabapentin, Dilaudid, and Nucynta IR 50mg )  -Harl Bowie, PA-C    History was obtained from chart review and the patient.      Review of Systems:     See HPI.           Color Code  Yellow - Numbness sensation  Purple - Pins & Needles sensation  Red     - Burning sensation  Blue    - Aching pain  Green Lambert Mody and Stabbing pain  [employer] - Other    Past Medical History:     Past Medical History:   Diagnosis Date    Calculus of kidney     CHF (congestive heart failure)     diastolic, per pt never had acute episode of CHF    Complication of anesthesia     almost chewed through her tongue and severe chills in past    Coronary artery disease     mild non-obstructive ASCAD on heart cath report 12/2015    Depression     Diabetes mellitus     Fibromyalgia     Hidradenitis  Hydradenitis     Hyperlipidemia     Hypertension     Malignant neoplasm of skin     basal/ squamos    Neuropathy     Type 2 diabetes mellitus, controlled        Past Surgical History:     Past Surgical History:   Procedure Laterality Date    BREAST SURGERY  1998    reduction x2    CARDIAC SURGERY  12/2015    L heart cath, diagnosis mild non-obstrutive ASCAD    CESAREAN SECTION  2008    with tummy tuck    COSMETIC SURGERY  1990s,2000s    thigh 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    HAND SURGERY Right     x3, cyst    INCISION AND DRAINAGE OF WOUND      multiple I&D due to hidradenitis    TONSILLECTOMY         Family History:     Family History   Problem Relation Age of Onset    Hyperlipidemia Mother     Alcohol abuse Father     Hyperlipidemia Father        Social History:     Social  History     Socioeconomic History    Marital status: Single     Spouse name: None    Number of children: None    Years of education: None    Highest education level: None   Occupational History    None   Social Engineer, site strain: None    Food insecurity:     Worry: None     Inability: None    Transportation needs:     Medical: None     Non-medical: None   Tobacco Use    Smoking status: Current Every Day Smoker     Packs/day: 1.50     Years: 30.00     Pack years: 45.00    Smokeless tobacco: Never Used   Substance and Sexual Activity    Alcohol use: Never     Frequency: Never    Drug use: Never    Sexual activity: None   Lifestyle    Physical activity:     Days per week: None     Minutes per session: None    Stress: None   Relationships    Social connections:     Talks on phone: None     Gets together: None     Attends religious service: None     Active member of club or organization: None     Attends meetings of clubs or organizations: None     Relationship status: None    Intimate partner violence:     Fear of current or ex partner: None     Emotionally abused: None     Physically abused: None     Forced sexual activity: None   Other Topics Concern    None   Social History Narrative    None       Allergies:     Allergies   Allergen Reactions    Wellbutrin [Bupropion] Other (See Comments)     tachycardia    Enablex [Darifenacin] Rash    Lyrica [Pregabalin] Rash    Vesicare [Solifenacin] Rash       Medications:     Current Outpatient Medications on File Prior to Visit   Medication Sig Dispense Refill    Armodafinil 150 MG tablet Take 150  mg by mouth      aspirin EC 81 MG EC tablet Take 81 mg by mouth nightly      Blood Glucose Monitoring Suppl (GLUCOCOM BLOOD GLUCOSE MONITOR) Device USE TO CHECK BLOOD SUGARS UP TO THREE TIMES DAILY      Cholecalciferol (VITAMIN D3) 2000 UNIT capsule Take 2,000 Units by mouth nightly      doxycycline (MONODOX) 100 MG capsule Take 100 mg by  mouth 2 (two) times daily Bid for first day, then daily      escitalopram (LEXAPRO) 20 MG tablet Take 40 mg by mouth nightly         glipiZIDE XL (GLUCOTROL XL) 5 MG 24 hr tablet Take 5 mg by mouth nightly         glucose blood (ONETOUCH VERIO) test strip Check blood sugars 3 times daily.   Diagnosis: Uncontrolled Type 2 DM      glucose blood test strip 1 each by Other route      hydroCHLOROthiazide (HYDRODIURIL) 25 MG tablet Take 25 mg by mouth nightly         insulin glargine (BASAGLAR KWIKPEN) 100 UNIT/ML injection pen Inject 42 Units into the skin nightly         Insulin Pen Needle (BD PEN NEEDLE NANO U/F) 32G X 4 MM Misc USE AS DIRECTED DAILY      isosorbide mononitrate (IMDUR) 30 MG 24 hr tablet Take 30 mg by mouth nightly         Lancets Misc USE TO CHECK BLOOD SUGARS UP TO THREE TIMES DAILY      lidocaine (LIDODERM) 5 % Place 1 patch onto the skin daily as needed         LORazepam (ATIVAN) 2 MG tablet Take 2 mg by mouth daily as needed         metFORMIN (GLUCOPHAGE) 1000 MG tablet Take 1,000 mg by mouth 2 (two) times daily with meals         mupirocin (BACTROBAN) 2 % ointment Apply 1 g topically 2 (two) times daily BID x5 days for MSSA      NUCYNTA ER 150 MG Tablet SR 12 hr Take 150 mg by mouth 2 (two) times daily         simvastatin (ZOCOR) 40 MG tablet Take 40 mg by mouth nightly         tiZANidine (ZANAFLEX) 4 MG tablet Take 4 mg by mouth daily as needed         triazolam (HALCION) 0.125 MG tablet Take 0.125 mg by mouth nightly as needed         vitamin E 1000 UNIT capsule Take 1,000 Units by mouth nightly      [DISCONTINUED] gabapentin (NEURONTIN) 300 MG capsule Take 300 mg by mouth 3 (three) times daily      [DISCONTINUED] NUCYNTA 50 MG Tab tablet Take 50 mg by mouth 2 (two) times daily          No current facility-administered medications on file prior to visit.        Vital Signs:     Vitals:    09/12/18 0922   BP: 123/84   Pulse: 85       Physical Exam:     General: No acute  distress, cooperative with examination  Psychologic: Affect appropriate, judgment and insight consistent with situation, no delusions or hallucinations  Skin: Warm, dry, no obvious lesions or scars  Eyes: Sclerae anicteric, no conjunctival injection  ENT: No visible  otorrhea, no rhinorrhea, trachea midline  Head: Normocephalic  Neck: No palpable masses  Musculoskeletal: Full ROM, normal muscle tone, no atrophy  Pulmonary:Normal respiratory effort, no audible wheezing  Cardiovascular: No pedal edema, pulses 2+ in bilat lower extremities  Abdominal: Non-tender to palpation, non-distended, no organomegaly, no palpable masses    Neuro exam:  Awake, alert, orientedx3  Speech clear and fluent  Attention span normal  PERRL, EOMI  Facial sensation intact  Face symmetric  Hearing intact  Tongue midline  Shoulder shrug strong bilaterally  Motor:  Arms:     Deltoid  Bicep Tricep Grip IO   Right 5 5 5 5 5    Left  5 5 5 5 5      Legs:     HF KE KF DF PF EHL   Right 5 5 5 5 5 5    Left  5 5 5 5 5 5      No pronator drift  No dysmetria  Light touch and pinprick intact in all 4 extremitiesexcept decreased to light touch in the hands diffusely bilaterally, worse on the left side, and diffusely in the arms bilaterally. She also has decreased sensation in the feet bilaterally at the dorsum and all 5 of the toes bilaterally.   DTRs:     Biceps Triceps Brachiorad Patellar Ankle   Right 2+ 2+ 2+ 4+ 2+   Left  2+ 2+ 2+ 4+ 2+     Hoffman's sign bilaterally.   No Clonus bilaterally.  Gait normal  unableto tandem gait  ableto toe and heel walk    Labs:     Lab Results   Component Value Date    WBC 10.06 (H) 08/07/2018    HGB 13.6 08/07/2018    HCT 41.1 08/07/2018    MCV 95.6 08/07/2018    PLT 349 (H) 08/07/2018     Lab Results   Component Value Date    NA 132 (L) 08/07/2018    K 3.9 08/07/2018    CL 92 (L) 08/07/2018    CO2 28 08/07/2018     Lab Results   Component Value Date    INR 1.0 08/07/2018    PT  12.9 08/07/2018     Lab Results   Component Value Date    BUN 11.0 08/07/2018     Lab Results   Component Value Date    CREAT 1.4 08/07/2018       Imaging:     My own interpretation of theMRI of the cervical spine without contrast is that it shows 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 There is also bilateral neuroforaminal stenosis at these levels. There are smaller disc herniations at C4-C5 and C7-T1, with mass-effect on the thecal sac but no severe compression of the spinal cord. There is intrinsic T2 cord signal changes within the spinal cord indicating spinal cord damage.      Assessment:     48 y.o. female presenting with symptoms of myelopathy, as well as bilateral arm pain and burning and numbness. She is myelopathic on examination with evidence of weakness in her left hand.  She has been having worsening falls since her last follow-up with me. 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  The patient was scheduled for surgery with me for C5-6 and C6-C7 ACDF.  However, the patient was found to be very high risk for surgical intervention based on her  medical clearance, both because of her severe hyperglycemia and her extensive smoking.      Plan:     I had an extensive discussion with the patient regarding the condition. I showed her again the imaging, and went over its findings in detail. I explained to the patient that her more recent falls might be caused by worsening myelopathy.  I again explained to her that while surgery is important for her, I do worry about her recovery and potential risks and side effects of surgery because she continues to smoke and because of her highly uncontrolled diabetes.  I explained to the patient that specifically she is at high risk of postop infection because of her diabetes as well as instrumentation failure and failure of the fusion because  of her smoking.  In addition to this risks, there also the usual risks of surgery which include but are not limited to death, bleeding, spinal cord injury, nerve injury, paralysis, weakness, numbness, CSF leak, pseudomeningocele formation, heart or lung complications from being under anesthesia, failure of surgery to improve her symptoms, or needing surgery in the future.  The patient voiced good understanding of all these risks, and in particular all the risks associated with her nicotine use and her diabetes.  She is adamant that she does not want to wait and would like the surgery done as soon as possible.  We will be sending her back again for medical clearance and preoperative blood work.  All questions were answered.    Of note, I counseled the patient extensively on quitting smoking but she was not interested in quitting smoking at this time.    I spent over 30 minutes with the patient today and more than 50% of this time was spent in counseling and coordinating the patient's care.    Thank you for the opportunity of allowing me to participate in the care of Ms. [name].      Hoyle Sauer. Adilson Grafton, MD  Minimally Invasive and Complex Spine Surgery & General Neurosurgery  Department of Neurosciences   [facility]  Assistant Professor of Neurosurgery   [facility] Address: [address], [address]   [place], [place] [address]  Office Phone [phone]   Appointments [phone]   Fax [phone]

## 2018-09-13 ENCOUNTER — Encounter (INDEPENDENT_AMBULATORY_CARE_PROVIDER_SITE_OTHER): Payer: Self-pay | Admitting: Neurological Surgery

## 2018-09-18 ENCOUNTER — Ambulatory Visit
Admission: RE | Admit: 2018-09-18 | Discharge: 2018-09-18 | Disposition: A | Discharge status: Home / Self Care | Payer: BC Managed Care – PPO | Source: Ambulatory Visit

## 2018-09-18 ENCOUNTER — Encounter (INDEPENDENT_AMBULATORY_CARE_PROVIDER_SITE_OTHER): Payer: Self-pay

## 2018-09-18 ENCOUNTER — Encounter: Payer: Self-pay | Admitting: Nurse Practitioner

## 2018-09-18 DIAGNOSIS — Z01818 Encounter for other preprocedural examination: Secondary | ICD-10-CM | POA: Insufficient documentation

## 2018-09-18 LAB — CBC AND DIFFERENTIAL
Absolute NRBC: 0 10*3/uL (ref 0.00–0.00)
Basophils Absolute Automated: 0.11 10*3/uL — ABNORMAL HIGH (ref 0.00–0.08)
Basophils Automated: 1 %
Eosinophils Absolute Automated: 0.51 10*3/uL — ABNORMAL HIGH (ref 0.00–0.44)
Eosinophils Automated: 4.5 %
Hematocrit: 45.7 % — ABNORMAL HIGH (ref 34.7–43.7)
Hgb: 15.6 g/dL — ABNORMAL HIGH (ref 11.4–14.8)
Immature Granulocytes Absolute: 0.09 10*3/uL — ABNORMAL HIGH (ref 0.00–0.07)
Immature Granulocytes: 0.8 %
Lymphocytes Absolute Automated: 3.39 10*3/uL — ABNORMAL HIGH (ref 0.42–3.22)
Lymphocytes Automated: 29.7 %
MCH: 31.6 pg (ref 25.1–33.5)
MCHC: 34.1 g/dL (ref 31.5–35.8)
MCV: 92.7 fL (ref 78.0–96.0)
MPV: 10 fL (ref 8.9–12.5)
Monocytes Absolute Automated: 0.57 10*3/uL (ref 0.21–0.85)
Monocytes: 5 %
Neutrophils Absolute: 6.73 10*3/uL — ABNORMAL HIGH (ref 1.10–6.33)
Neutrophils: 59 %
Nucleated RBC: 0 /100 WBC (ref 0.0–0.0)
Platelets: 315 10*3/uL (ref 142–346)
RBC: 4.93 10*6/uL (ref 3.90–5.10)
RDW: 12 % (ref 11–15)
WBC: 11.4 10*3/uL — ABNORMAL HIGH (ref 3.10–9.50)

## 2018-09-18 LAB — URINALYSIS, REFLEX TO MICROSCOPIC EXAM IF INDICATED
Bilirubin, UA: NEGATIVE
Blood, UA: NEGATIVE
Glucose, UA: 500 — AB
Ketones UA: NEGATIVE
Leukocyte Esterase, UA: NEGATIVE
Nitrite, UA: NEGATIVE
Protein, UR: 30 — AB
Specific Gravity UA: 1.026 (ref 1.001–1.035)
Urine pH: 5 (ref 5.0–8.0)
Urobilinogen, UA: NEGATIVE mg/dL (ref 0.2–2.0)

## 2018-09-18 LAB — HEMOLYSIS INDEX: Hemolysis Index: 8 (ref 0–18)

## 2018-09-18 LAB — PT/INR
PT INR: 0.9 (ref 0.9–1.1)
PT: 12.2 s — ABNORMAL LOW (ref 12.6–15.0)

## 2018-09-18 LAB — HEMOGLOBIN A1C
Average Estimated Glucose: 257.5 mg/dL
Hemoglobin A1C: 10.6 % — ABNORMAL HIGH (ref 4.6–5.9)

## 2018-09-18 LAB — BASIC METABOLIC PANEL
Anion Gap: 12 (ref 5.0–15.0)
BUN: 14 mg/dL (ref 7.0–19.0)
CO2: 27 mEq/L (ref 22–29)
Calcium: 10.5 mg/dL (ref 8.5–10.5)
Chloride: 92 mEq/L — ABNORMAL LOW (ref 100–111)
Creatinine: 1 mg/dL (ref 0.6–1.0)
Glucose: 302 mg/dL — ABNORMAL HIGH (ref 70–100)
Potassium: 4.3 mEq/L (ref 3.5–5.1)
Sodium: 131 mEq/L — ABNORMAL LOW (ref 136–145)

## 2018-09-18 LAB — GFR: EGFR: 59.1

## 2018-09-18 LAB — TYPE AND SCREEN
AB Screen Gel: NEGATIVE
ABO Rh: O POS

## 2018-09-18 NOTE — H&P (Signed)
PREOP HISTORY AND PHYSICAL EXAM    Patient Name: [name],[name]  Surgeon(s):  Sachit Gilman, Hoyle Sauer, MD       Assessment:     48 yo female with history of OSA, CHF, HTN, hyperlipidemia, IDDM, hidradenitis, neuropathy, depression, smoker, now here with a longstanding history of cervical disc herniation presents for pre op medical evaluation prior to surgery.  C5-C7 ACDF surgery has been scheduled with Dr. [name] for 09/23/2018 at [facility].      Patient has 6.6% RCRI in history and adequate functional capacity (METS 5), and is ASA class 3 with elevated BMI 37.49.  Based on ACC/AHA 2014 guidelines, no additional CV testing is needed.  Patient is at acceptable risk to proceed with planned procedure.    Pre op evaluation:     No cardiac symptoms     Functional capacity is 5 Mets     OSA:  Optimized on CPAP nightly.     CHF:  Diastolic; no history of acute CHF episode.  L heart catch 2017 with diagnosis of mild non-obstructive ASCAD.     Hypertension:  Optimized on Imdur, hydrochlorothiazide daily.     Hyperlipidemia:  Optimized on simvastatin daily.     IDDM:  Optimized on basaglar, metformin, glipizide daily.  Pt does not check BS at home.     Hidradenitis:  Current left buttock abscess, I&D 08/2018 with improvement.  Multiple previous abscesses and I&Ds.     Neuropathy:  Optimized on gabapentin daily.     Depression:  Optimized on cymbalta, ativan.     Smoker:  Current smoker; 45 pack years.     Exercise:  Walking.  No cardiopulmonary symptoms with exercise.     Pt denies complications with anesthesia in the past.    Patient denies any exertional chest pain, shortness of breath, palpitations, dizziness, lightheadedness, syncope and claudication.  She has no cardiopulmonary symptoms.  She is afebrile.    Patients medical and surgical history, medication reconciliation and family history were reviewed as well as a complete physical exam was performed.  Recent laboratory results were obtained.  EKG was done  06/09/18 and is NSR with ventricular rate of 72.      Date Time: 09/18/18  2:40 PM  Plan:     Impression/Plan:  -Encouraged to stop smoking to avoid pulmonary complications and increased risk of infection; pt declines.  -I have sent the following lab testing, CBC, BMP, UA, PT/INR, HbA1C, T&S, MRSA/MSSA.  -Pre-op evaluation - not optimized for high risk surgery but will defer to Dr. [name] to consider risk vs benefit.  -Hold ASA, vitamins, supplements, NSAIDS, etc. 7-10 days prior to procedure.  -Discussed the importance of addressing and managing traditional modifiable cardiac risk factors (lipids, blood pressure, smoking, obesity, sedentary lifestyle) in order to reduce the risk of cardiovascular disease, CVD (heart attacks, heart failure, strokes, and peripheral arterial disease).  -Discussed with patient the importance of achieving and maintaining targeted glycemic control in order to reduce the risk of diabetes related complications such as eye disease, kidney disease, nerve damage, and amputation of the limbs as well as perioperative care management to reduce the risk of infection and long hospital stay  -Discussed the importance of patients active involvement in diabetes care (attending diabetes education classes and or individual education sessions, monitoring blood glucose levels before and after meals and keeping a log of self-monitored blood glucose, SMBG, taking prescribed medications on time, eating a healthy diabetic diet, and engaging in regular physical activity as  tolerated).  -Hibiclens and instructions on proper use given to pt.  -NPO after MN DOS.  -Take Nucynta DOS. Hold metformin 48 hours; hold glipizide 3/13 dose; basaglar 24 units PM before surgery.  -Keep red arm band on through DOS  -Avoid cuts/scrapes to body prior to surgery    -Dr. [name] notified of HbA1C 10.6, glucose 302, WBC 11.4, continued open left buttock abscess and abdominal rash; will defer to him for surgery as scheduled  considering risk vs. benefit based upon worsening symptoms.  -Pt called and informed of lab results.  Pt will attempt to work on her eating, reducing carbs and check her glucose at home.  I reminded her to increase Basaglar by 1 unit daily until fasting BS around 130.  Also recommended blood sugar monitoring devices such as Libre or Dexcom.      History of Present Illness:     I had the pleasure of seeing [name] today for preoperative evaluation.  She is a 48 yo female with history of OSA, CHF, HTN, hyperlipidemia, IDDM, hidradenitis, neuropathy, depression, smoker, who is scheduled for C5-C7 ACDF surgery on 09/23/2018 at [facility] by Dr. [name].  Her neck and bilat arms are worsening and is affecting her quality of life.  She was originally scheduled for surgery 08/2018 but surgery was postponed due to left buttock abscess and uncontrolled diabetes.  She returns today for pre-op workup.  She has increased her Basaglar but is still not checking her glucose at home.  She continues to smoke cigarettes despite recommendation to quit.  Her left buttock abscess is improving but still draining bloody drainage.  She continues with neck pain radiating into the bilateral upper extremities with numbness in the left hand after moving furniture/ boxes October 2019.  MRI of the cervical spine showed large disc herniations and nerve root compression.  Her pain level today is 4/10 which she describes as burning and aching to bilateral arms and hands, L>R.  Since I saw her 07/2018, she now complains of all of her toes having numbness at the top of her feet, worsening pain and new balance issues.  She states that she fell last month due to balance problems and hit her right forehead.  She is very frustrated with her continued symptoms and requests to continue with surgery as planned despite risk with uncontrolled DM and smoking.  She denies bladder or bowel dysfunction.  She continues with Nucynta ER which is providing  some relief. She indicated that non-surgical treatments failed to alleviate her pain and so unable to perform her usual activities of daily living and therefore will have it surgically repaired.    She denies any exertional discomfort to suggest angina.      Risk factors; no history of surgical complications, no easy bleeding tendency, no history of blood clots, no history of allergic reaction to anesthetic agents.    Current symptoms:  no unusual fatigue, normal appetite, no fever, no chest pain, no palpitations, no unexplained syncope, no shortness of breath, no reduction in exercise tolerance due to chest pain or dyspnea, no upper respiratory infection, no wheezing, no atypical bleeding, normal bowel function, no dysuria/urinary symptoms.    Procedures:     Procedure(s):  C5-C7 ACDF      Date of Procedure:   09/23/2018    Past Medical History:     Past Medical History:   Diagnosis Date    Calculus of kidney     CHF (congestive heart failure)  diastolic, per pt never had acute episode of CHF    Complication of anesthesia     almost chewed through her tongue and severe chills in past    Coronary artery disease     mild non-obstructive ASCAD on heart cath report 12/2015    Depression     Diabetes mellitus     Fibromyalgia     Hidradenitis     Hydradenitis     Hyperlipidemia     Hypertension     Malignant neoplasm of skin     basal/ squamos    Neuropathy     Sleep apnea     Type 2 diabetes mellitus, controlled        Past Surgical History:     Past Surgical History:   Procedure Laterality Date    BREAST SURGERY  1998    reduction x2    CARDIAC SURGERY  12/2015    L heart cath, diagnosis mild non-obstrutive ASCAD    CESAREAN SECTION  2008    with tummy tuck    COSMETIC SURGERY  1990s,2000s    thigh 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    HAND SURGERY Right     x3, cyst    INCISION AND DRAINAGE OF WOUND      multiple I&D due to hidradenitis    TONSILLECTOMY         Family History:     Family  History   Problem Relation Age of Onset    Hyperlipidemia Mother     Alcohol abuse Father     Hyperlipidemia Father        Social History:     Social History     Socioeconomic History    Marital status: Single     Spouse name: Not on file    Number of children: Not on file    Years of education: Not on file    Highest education level: Not on file   Occupational History    Not on file   Social Needs    Financial resource strain: Not on file    Food insecurity:     Worry: Not on file     Inability: Not on file    Transportation needs:     Medical: Not on file     Non-medical: Not on file   Tobacco Use    Smoking status: Current Every Day Smoker     Packs/day: 1.50     Years: 30.00     Pack years: 45.00    Smokeless tobacco: Never Used   Substance and Sexual Activity    Alcohol use: Never     Frequency: Never    Drug use: Never    Sexual activity: Not on file   Lifestyle    Physical activity:     Days per week: Not on file     Minutes per session: Not on file    Stress: Not on file   Relationships    Social connections:     Talks on phone: Not on file     Gets together: Not on file     Attends religious service: Not on file     Active member of club or organization: Not on file     Attends meetings of clubs or organizations: Not on file     Relationship status: Not on file    Intimate partner violence:     Fear of current or ex partner: Not on file  Emotionally abused: Not on file     Physically abused: Not on file     Forced sexual activity: Not on file   Other Topics Concern    Not on file   Social History Narrative    Not on file       Allergies:     Allergies   Allergen Reactions    Wellbutrin [Bupropion] Other (See Comments)     tachycardia    Enablex [Darifenacin] Rash    Gabapentin Rash    Lyrica [Pregabalin] Rash    Nucynta [Tapentadol] Rash     Instant release only; does well with extended release    Vesicare [Solifenacin] Rash       Medications:   No current  facility-administered medications for this encounter.     Current Outpatient Medications:     DULoxetine (CYMBALTA) 30 MG capsule, Take 30 mg by mouth nightly, Disp: , Rfl:     Armodafinil 150 MG tablet, Take 150 mg by mouth, Disp: , Rfl:     aspirin EC 81 MG EC tablet, Take 81 mg by mouth nightly, Disp: , Rfl:     Blood Glucose Monitoring Suppl (GLUCOCOM BLOOD GLUCOSE MONITOR) Device, USE TO CHECK BLOOD SUGARS UP TO THREE TIMES DAILY, Disp: , Rfl:     Cholecalciferol (VITAMIN D3) 2000 UNIT capsule, Take 2,000 Units by mouth nightly, Disp: , Rfl:     glipiZIDE XL (GLUCOTROL XL) 5 MG 24 hr tablet, Take 5 mg by mouth nightly  , Disp: , Rfl:     glucose blood (ONETOUCH VERIO) test strip, Check blood sugars 3 times daily.   Diagnosis: Uncontrolled Type 2 DM, Disp: , Rfl:     glucose blood test strip, 1 each by Other route, Disp: , Rfl:     hydroCHLOROthiazide (HYDRODIURIL) 25 MG tablet, Take 25 mg by mouth nightly  , Disp: , Rfl:     insulin glargine (BASAGLAR KWIKPEN) 100 UNIT/ML injection pen, Inject 48 Units into the skin nightly  , Disp: , Rfl:     Insulin Pen Needle (BD PEN NEEDLE NANO U/F) 32G X 4 MM Misc, USE AS DIRECTED DAILY, Disp: , Rfl:     isosorbide mononitrate (IMDUR) 30 MG 24 hr tablet, Take 30 mg by mouth nightly  , Disp: , Rfl:     Lancets Misc, USE TO CHECK BLOOD SUGARS UP TO THREE TIMES DAILY, Disp: , Rfl:     LORazepam (ATIVAN) 2 MG tablet, Take 2 mg by mouth daily as needed  , Disp: , Rfl:     metFORMIN (GLUCOPHAGE) 1000 MG tablet, Take 1,000 mg by mouth 2 (two) times daily with meals  , Disp: , Rfl:     NUCYNTA ER 150 MG Tablet SR 12 hr, Take 150 mg by mouth 2 (two) times daily  , Disp: , Rfl:     simvastatin (ZOCOR) 40 MG tablet, Take 40 mg by mouth nightly  , Disp: , Rfl:     triazolam (HALCION) 0.125 MG tablet, Take 0.125 mg by mouth nightly as needed  , Disp: , Rfl:     vitamin E 1000 UNIT capsule, Take 1,000 Units by mouth nightly, Disp: , Rfl:     Review of Systems:        Review of Systems   Constitutional: Negative.    HENT: Negative.    Eyes: Negative.    Respiratory: Negative.    Cardiovascular: Negative.    Gastrointestinal: Negative.    Genitourinary: Negative.  Musculoskeletal: Positive for neck pain.        Bilat arm    Skin:        Left buttock wound from abscess improving, pt reports some bloody drainage at times.   Neurological: Positive for weakness.   Endo/Heme/Allergies: Positive for environmental allergies.   Psychiatric/Behavioral: Positive for depression.     Physical Exam:   BP 115/69    Pulse 68    Temp (!) 96.8 F (36 C) (Temporal)    Resp 14    Ht 1.575 m (5\' 2" )    Wt 93 kg (205 lb)    SpO2 97%    BMI 37.49 kg/m     Physical Exam  Constitutional:       General: She is not in acute distress.     Appearance: Normal appearance. She is obese.   HENT:      Head: Normocephalic and atraumatic.      Right Ear: External ear normal.      Left Ear: External ear normal.      Nose: Nose normal.      Mouth/Throat:      Mouth: Mucous membranes are moist.      Pharynx: Oropharynx is clear.   Eyes:      Conjunctiva/sclera: Conjunctivae normal.   Neck:      Musculoskeletal: Normal range of motion and neck supple.   Cardiovascular:      Rate and Rhythm: Normal rate and regular rhythm.      Pulses: Normal pulses.      Heart sounds: Normal heart sounds. No murmur.   Pulmonary:      Effort: Pulmonary effort is normal.      Breath sounds: Normal breath sounds. No wheezing.   Abdominal:      General: Bowel sounds are normal.      Palpations: Abdomen is soft.   Genitourinary:     Comments: deferred  Musculoskeletal: Normal range of motion.      Comments: 5/5 strength bilat hands and bilat legs.   Skin:     General: Skin is warm and dry.      Capillary Refill: Capillary refill takes less than 2 seconds.      Findings: Rash present. No erythema.      Comments: Mild rash central abdomen.  Left buttock with wound that appears closed without drainage; no erythema, no warmth.  No  dressing present.   Neurological:      General: No focal deficit present.      Mental Status: She is oriented to person, place, and time.      Motor: No weakness.      Gait: Gait normal.   Psychiatric:         Mood and Affect: Mood normal.         Behavior: Behavior normal.           Erin P Bartko  09/18/2018

## 2018-09-19 LAB — PRESURGICAL SURVEILLANCE, MSSA+MRSA

## 2018-09-21 ENCOUNTER — Inpatient Hospital Stay
Admission: EM | Admit: 2018-09-21 | Discharge: 2018-09-24 | DRG: 982 | Disposition: A | Discharge status: Home / Self Care | Payer: BC Managed Care – PPO | Attending: Internal Medicine | Admitting: Internal Medicine

## 2018-09-21 ENCOUNTER — Telehealth: Payer: BC Managed Care – PPO

## 2018-09-21 ENCOUNTER — Encounter (INDEPENDENT_AMBULATORY_CARE_PROVIDER_SITE_OTHER): Payer: Self-pay

## 2018-09-21 DIAGNOSIS — R296 Repeated falls: Secondary | ICD-10-CM | POA: Diagnosis present

## 2018-09-21 DIAGNOSIS — Z794 Long term (current) use of insulin: Secondary | ICD-10-CM

## 2018-09-21 DIAGNOSIS — E785 Hyperlipidemia, unspecified: Secondary | ICD-10-CM | POA: Diagnosis present

## 2018-09-21 DIAGNOSIS — K5903 Drug induced constipation: Secondary | ICD-10-CM | POA: Diagnosis not present

## 2018-09-21 DIAGNOSIS — M50222 Other cervical disc displacement at C5-C6 level: Secondary | ICD-10-CM | POA: Diagnosis present

## 2018-09-21 DIAGNOSIS — I509 Heart failure, unspecified: Secondary | ICD-10-CM

## 2018-09-21 DIAGNOSIS — E1140 Type 2 diabetes mellitus with diabetic neuropathy, unspecified: Secondary | ICD-10-CM | POA: Diagnosis present

## 2018-09-21 DIAGNOSIS — G8929 Other chronic pain: Secondary | ICD-10-CM | POA: Diagnosis present

## 2018-09-21 DIAGNOSIS — Z79891 Long term (current) use of opiate analgesic: Secondary | ICD-10-CM

## 2018-09-21 DIAGNOSIS — Z85828 Personal history of other malignant neoplasm of skin: Secondary | ICD-10-CM

## 2018-09-21 DIAGNOSIS — I5031 Acute diastolic (congestive) heart failure: Secondary | ICD-10-CM

## 2018-09-21 DIAGNOSIS — R0789 Other chest pain: Secondary | ICD-10-CM | POA: Diagnosis not present

## 2018-09-21 DIAGNOSIS — M50223 Other cervical disc displacement at C6-C7 level: Secondary | ICD-10-CM | POA: Diagnosis present

## 2018-09-21 DIAGNOSIS — G9520 Unspecified cord compression: Secondary | ICD-10-CM

## 2018-09-21 DIAGNOSIS — E1165 Type 2 diabetes mellitus with hyperglycemia: Principal | ICD-10-CM | POA: Diagnosis present

## 2018-09-21 DIAGNOSIS — F17200 Nicotine dependence, unspecified, uncomplicated: Secondary | ICD-10-CM | POA: Diagnosis present

## 2018-09-21 DIAGNOSIS — G4733 Obstructive sleep apnea (adult) (pediatric): Secondary | ICD-10-CM | POA: Diagnosis present

## 2018-09-21 DIAGNOSIS — E876 Hypokalemia: Secondary | ICD-10-CM | POA: Diagnosis not present

## 2018-09-21 DIAGNOSIS — G9529 Other cord compression: Secondary | ICD-10-CM | POA: Diagnosis present

## 2018-09-21 DIAGNOSIS — I110 Hypertensive heart disease with heart failure: Secondary | ICD-10-CM | POA: Diagnosis present

## 2018-09-21 DIAGNOSIS — I2510 Atherosclerotic heart disease of native coronary artery without angina pectoris: Secondary | ICD-10-CM | POA: Diagnosis present

## 2018-09-21 DIAGNOSIS — T402X5A Adverse effect of other opioids, initial encounter: Secondary | ICD-10-CM | POA: Diagnosis not present

## 2018-09-21 DIAGNOSIS — E782 Mixed hyperlipidemia: Secondary | ICD-10-CM

## 2018-09-21 DIAGNOSIS — Z79899 Other long term (current) drug therapy: Secondary | ICD-10-CM

## 2018-09-21 DIAGNOSIS — M797 Fibromyalgia: Secondary | ICD-10-CM | POA: Diagnosis present

## 2018-09-21 DIAGNOSIS — Z9119 Patient's noncompliance with other medical treatment and regimen: Secondary | ICD-10-CM

## 2018-09-21 DIAGNOSIS — IMO0001 Reserved for inherently not codable concepts without codable children: Secondary | ICD-10-CM

## 2018-09-21 DIAGNOSIS — I5032 Chronic diastolic (congestive) heart failure: Secondary | ICD-10-CM | POA: Diagnosis present

## 2018-09-21 DIAGNOSIS — Z981 Arthrodesis status: Secondary | ICD-10-CM

## 2018-09-21 LAB — URINALYSIS REFLEX TO MICROSCOPIC EXAM - REFLEX TO CULTURE
Bilirubin, UA: NEGATIVE
Blood, UA: NEGATIVE
Glucose, UA: 500 — AB
Ketones UA: NEGATIVE
Leukocyte Esterase, UA: NEGATIVE
Nitrite, UA: NEGATIVE
Protein, UR: NEGATIVE
Specific Gravity UA: 1.025 (ref 1.001–1.035)
Urine pH: 5 (ref 5.0–8.0)
Urobilinogen, UA: NEGATIVE mg/dL (ref 0.2–2.0)

## 2018-09-21 LAB — CBC AND DIFFERENTIAL
Absolute NRBC: 0 10*3/uL (ref 0.00–0.00)
Basophils Absolute Automated: 0.07 10*3/uL (ref 0.00–0.08)
Basophils Automated: 0.7 %
Eosinophils Absolute Automated: 0.46 10*3/uL — ABNORMAL HIGH (ref 0.00–0.44)
Eosinophils Automated: 4.8 %
Hematocrit: 43.9 % — ABNORMAL HIGH (ref 34.7–43.7)
Hgb: 15 g/dL — ABNORMAL HIGH (ref 11.4–14.8)
Immature Granulocytes Absolute: 0.05 10*3/uL (ref 0.00–0.07)
Immature Granulocytes: 0.5 %
Lymphocytes Absolute Automated: 3.65 10*3/uL — ABNORMAL HIGH (ref 0.42–3.22)
Lymphocytes Automated: 38.3 %
MCH: 31.2 pg (ref 25.1–33.5)
MCHC: 34.2 g/dL (ref 31.5–35.8)
MCV: 91.3 fL (ref 78.0–96.0)
MPV: 10 fL (ref 8.9–12.5)
Monocytes Absolute Automated: 0.54 10*3/uL (ref 0.21–0.85)
Monocytes: 5.7 %
Neutrophils Absolute: 4.75 10*3/uL (ref 1.10–6.33)
Neutrophils: 50 %
Nucleated RBC: 0 /100 WBC (ref 0.0–0.0)
Platelets: 265 10*3/uL (ref 142–346)
RBC: 4.81 10*6/uL (ref 3.90–5.10)
RDW: 12 % (ref 11–15)
WBC: 9.52 10*3/uL — ABNORMAL HIGH (ref 3.10–9.50)

## 2018-09-21 LAB — COMPREHENSIVE METABOLIC PANEL
ALT: 23 U/L (ref 0–55)
AST (SGOT): 19 U/L (ref 5–34)
Albumin/Globulin Ratio: 1.2 (ref 0.9–2.2)
Albumin: 3.8 g/dL (ref 3.5–5.0)
Alkaline Phosphatase: 73 U/L (ref 37–106)
Anion Gap: 12 (ref 5.0–15.0)
BUN: 16 mg/dL (ref 7.0–19.0)
Bilirubin, Total: 1 mg/dL (ref 0.2–1.2)
CO2: 27 mEq/L (ref 22–29)
Calcium: 9.7 mg/dL (ref 8.5–10.5)
Chloride: 96 mEq/L — ABNORMAL LOW (ref 100–111)
Creatinine: 1 mg/dL (ref 0.6–1.0)
Globulin: 3.2 g/dL (ref 2.0–3.6)
Glucose: 241 mg/dL — ABNORMAL HIGH (ref 70–100)
Potassium: 3.7 mEq/L (ref 3.5–5.1)
Protein, Total: 7 g/dL (ref 6.0–8.3)
Sodium: 135 mEq/L — ABNORMAL LOW (ref 136–145)

## 2018-09-21 LAB — GLUCOSE WHOLE BLOOD - POCT
Whole Blood Glucose POCT: 262 mg/dL — ABNORMAL HIGH (ref 70–100)
Whole Blood Glucose POCT: 191 mg/dL — ABNORMAL HIGH (ref 70–100)
Whole Blood Glucose POCT: 235 mg/dL — ABNORMAL HIGH (ref 70–100)
Whole Blood Glucose POCT: 154 mg/dL — ABNORMAL HIGH (ref 70–100)

## 2018-09-21 LAB — GFR: EGFR: 59.1

## 2018-09-21 LAB — HEMOLYSIS INDEX: Hemolysis Index: 8 (ref 0–18)

## 2018-09-21 MED ORDER — INSULIN GLARGINE 100 UNIT/ML SC SOLN
50.00 [IU] | Freq: Every evening | SUBCUTANEOUS | Status: DC
Start: 2018-09-21 — End: 2018-09-22
  Administered 2018-09-21: 22:00:00 50 [IU] via SUBCUTANEOUS
  Filled 2018-09-21: qty 50

## 2018-09-21 MED ORDER — INSULIN GLARGINE 100 UNIT/ML SC SOLN
10.00 [IU] | Freq: Every morning | SUBCUTANEOUS | Status: DC
Start: 2018-09-22 — End: 2018-09-22
  Filled 2018-09-21: qty 10

## 2018-09-21 MED ORDER — GLUCAGON 1 MG IJ SOLR (WRAP)
1.00 mg | INTRAMUSCULAR | Status: DC | PRN
Start: 2018-09-21 — End: 2018-09-24

## 2018-09-21 MED ORDER — GLIPIZIDE 5 MG PO TABS
2.5000 mg | ORAL_TABLET | Freq: Two times a day (BID) | ORAL | Status: DC
Start: 2018-09-21 — End: 2018-09-21

## 2018-09-21 MED ORDER — INSULIN LISPRO 100 UNIT/ML SC SOLN
2.00 [IU] | Freq: Three times a day (TID) | SUBCUTANEOUS | Status: DC
Start: 2018-09-21 — End: 2018-09-24
  Administered 2018-09-21: 13:00:00 2 [IU] via SUBCUTANEOUS
  Administered 2018-09-21 – 2018-09-22 (×3): 4 [IU] via SUBCUTANEOUS
  Administered 2018-09-22: 12:00:00 8 [IU] via SUBCUTANEOUS
  Administered 2018-09-23: 18:00:00 2 [IU] via SUBCUTANEOUS
  Filled 2018-09-21 (×2): qty 12
  Filled 2018-09-21: qty 6
  Filled 2018-09-21: qty 12
  Filled 2018-09-21: qty 6
  Filled 2018-09-21: qty 24

## 2018-09-21 MED ORDER — SODIUM CHLORIDE 0.9 % IV BOLUS
1000.00 mL | Freq: Once | INTRAVENOUS | Status: AC
Start: 2018-09-21 — End: 2018-09-21
  Administered 2018-09-21: 11:00:00 1000 mL via INTRAVENOUS

## 2018-09-21 MED ORDER — NICOTINE 14 MG/24HR TD PT24
1.00 | MEDICATED_PATCH | Freq: Every day | TRANSDERMAL | Status: DC
Start: 2018-09-21 — End: 2018-09-24
  Filled 2018-09-21: qty 1

## 2018-09-21 MED ORDER — DEXTROSE 10 % IV BOLUS
125.00 mL | INTRAVENOUS | Status: DC | PRN
Start: 2018-09-21 — End: 2018-09-24

## 2018-09-21 MED ORDER — DULOXETINE HCL 30 MG PO CPEP
30.00 mg | ORAL_CAPSULE | Freq: Every evening | ORAL | Status: DC
Start: 2018-09-21 — End: 2018-09-21

## 2018-09-21 MED ORDER — DULOXETINE HCL 20 MG PO CPEP
20.00 mg | ORAL_CAPSULE | Freq: Two times a day (BID) | ORAL | Status: DC
Start: 2018-09-21 — End: 2018-09-24
  Administered 2018-09-21 – 2018-09-24 (×5): 20 mg via ORAL
  Filled 2018-09-21 (×8): qty 1

## 2018-09-21 MED ORDER — HYDROCHLOROTHIAZIDE 25 MG PO TABS
25.0000 mg | ORAL_TABLET | Freq: Every evening | ORAL | Status: DC
Start: 2018-09-21 — End: 2018-09-24
  Administered 2018-09-21 – 2018-09-23 (×3): 25 mg via ORAL
  Filled 2018-09-21 (×3): qty 1

## 2018-09-21 MED ORDER — INSULIN LISPRO 100 UNIT/ML SC SOLN
1.00 [IU] | Freq: Every evening | SUBCUTANEOUS | Status: DC
Start: 2018-09-21 — End: 2018-09-24
  Administered 2018-09-23: 22:00:00 4 [IU] via SUBCUTANEOUS
  Filled 2018-09-21: qty 12

## 2018-09-21 MED ORDER — ENOXAPARIN SODIUM 40 MG/0.4ML SC SOLN
40.00 mg | Freq: Every day | SUBCUTANEOUS | Status: DC
Start: 2018-09-21 — End: 2018-09-24
  Administered 2018-09-21 – 2018-09-24 (×3): 40 mg via SUBCUTANEOUS
  Filled 2018-09-21 (×3): qty 0.4

## 2018-09-21 MED ORDER — INSULIN GLARGINE 100 UNIT/ML SC SOLN
10.00 [IU] | Freq: Every evening | SUBCUTANEOUS | Status: DC
Start: 2018-09-21 — End: 2018-09-21

## 2018-09-21 MED ORDER — GLUCOSE 40 % PO GEL
15.00 g | ORAL | Status: DC | PRN
Start: 2018-09-21 — End: 2018-09-24

## 2018-09-21 MED ORDER — HYDROCODONE-ACETAMINOPHEN 5-325 MG PO TABS
1.0000 | ORAL_TABLET | ORAL | Status: DC | PRN
Start: 2018-09-21 — End: 2018-09-24
  Administered 2018-09-21 – 2018-09-24 (×11): 1 via ORAL
  Filled 2018-09-21 (×11): qty 1

## 2018-09-21 MED ORDER — METFORMIN HCL 500 MG PO TABS
1000.0000 mg | ORAL_TABLET | Freq: Two times a day (BID) | ORAL | Status: DC
Start: 2018-09-21 — End: 2018-09-24
  Administered 2018-09-21 – 2018-09-24 (×5): 1000 mg via ORAL
  Filled 2018-09-21 (×5): qty 2

## 2018-09-21 MED ORDER — ZOLPIDEM TARTRATE 5 MG PO TABS
2.5000 mg | ORAL_TABLET | Freq: Every evening | ORAL | Status: DC | PRN
Start: 2018-09-21 — End: 2018-09-24
  Administered 2018-09-22 – 2018-09-23 (×3): 2.5 mg via ORAL
  Filled 2018-09-21 (×3): qty 1

## 2018-09-21 MED ORDER — NALOXONE HCL 0.4 MG/ML IJ SOLN (WRAP)
0.20 mg | INTRAMUSCULAR | Status: DC | PRN
Start: 2018-09-21 — End: 2018-09-24

## 2018-09-21 MED ORDER — VITAMIN D 25 MCG (1000 UT) PO TABS
25.0000 ug | ORAL_TABLET | Freq: Every day | ORAL | Status: DC
Start: 2018-09-21 — End: 2018-09-24
  Administered 2018-09-21 – 2018-09-24 (×3): 25 ug via ORAL
  Filled 2018-09-21 (×3): qty 1

## 2018-09-21 MED ORDER — TRAMADOL HCL 50 MG PO TABS
50.0000 mg | ORAL_TABLET | Freq: Three times a day (TID) | ORAL | Status: DC
Start: 2018-09-21 — End: 2018-09-22
  Administered 2018-09-21: 50 mg via ORAL
  Filled 2018-09-21: qty 1

## 2018-09-21 MED ORDER — HYDROXYZINE HCL 10 MG PO TABS
10.0000 mg | ORAL_TABLET | Freq: Four times a day (QID) | ORAL | Status: DC | PRN
Start: 2018-09-21 — End: 2018-09-24
  Administered 2018-09-21 – 2018-09-22 (×2): 10 mg via ORAL
  Filled 2018-09-21 (×2): qty 1

## 2018-09-21 MED ORDER — SIMVASTATIN 40 MG PO TABS
40.0000 mg | ORAL_TABLET | Freq: Every evening | ORAL | Status: DC
Start: 2018-09-21 — End: 2018-09-24
  Administered 2018-09-21 – 2018-09-23 (×3): 40 mg via ORAL
  Filled 2018-09-21 (×3): qty 1

## 2018-09-21 MED ORDER — ISOSORBIDE MONONITRATE ER 30 MG PO TB24
30.00 mg | ORAL_TABLET | Freq: Every evening | ORAL | Status: DC
Start: 2018-09-21 — End: 2018-09-24
  Administered 2018-09-21 – 2018-09-23 (×3): 30 mg via ORAL
  Filled 2018-09-21 (×4): qty 1

## 2018-09-21 NOTE — ED Provider Notes (Signed)
EMERGENCY DEPARTMENT HISTORY AND PHYSICAL EXAM     Physician/Midlevel provider first contact with patient: 09/21/18 1003         History of Presenting Illness:  History Provided By: Patient    [name] is a 48 y.o. female pw poorly controlled diabetes.  Patient reports diabetes diagnosis over 10 years ago.  She is currently maintained on insulin 50 units at night, metformin 1000 mg taken only at night and glipizide.  She reports her last A1c was over 10.  She denies urgency, frequency, polydipsia or polyuria.  She recently moved here a few months ago and established with PMD in the last week.  She is scheduled for cervical spine surgery this Saturday and was advised to come to the hospital for blood sugar optimization prior to surgery.    Reviewed Past Medical History, Surgical History, Family History and Social as documented.    PCP: Dione Plover, NP  SPECIALISTS:    Review of Systems:  Review of Systems   Constitutional: Negative for chills and fever.   Respiratory: Negative for chest tightness and shortness of breath.    Musculoskeletal: Positive for neck pain.   All other systems reviewed as negative.    Physical Exam:  Vitals:    09/21/18 0928   BP: 123/87   Pulse: 77   Resp: 15   Temp: 97.9 F (36.6 C)   TempSrc: Oral   SpO2: 97%   Weight: 93 kg   Height: 5\' 2"  (1.575 m)       Physical Exam   Constitutional: Patient is alert.  Well nourished.  NAD  Head: Atraumatic.   Eyes: EOMI. PERRL  ENT:  MMM.   Neck:  FROM.   Cardiovascular: Normal rate and regular rhythm.   Pulmonary/Chest: Effort normal and breath sounds normal. No respiratory distress.   Abdominal: Soft. There is no tenderness. Bowel sounds present and normal.    Musculoskeletal:  No lower extremity edema or tenderness.    Neurological: Patient is alert and oriented to person, place, and time.  No focal deficits.   Skin: Skin is warm and dry.    Old Medical Records: Old medical records.  Nursing notes.    Patient Update Notes:  ED Course as  of Sep 21 1014   Thu Sep 21, 2018   1014 D/w Arna Medici, Neurosurgery, admit to Dr. Richardson Chiquito for optimization prior to surgery (planned on Saturday)    [BK]   1015 D/w Dr. Richardson Chiquito accepts to gen inpatient.     [BK]      ED Course User Index  [BK] Tenny Craw, MD       Clinical Impression:   1. Type 2 diabetes mellitus with hyperglycemia, with long-term current use of insulin    2. Cervical spinal cord compression        ED Disposition     ED Disposition Condition Date/Time Comment    Admit  Thu Sep 21, 2018 10:12 AM Admitting Physician: Christophe Louis [[id number]]   Diagnosis: Hyperglycemia due to type 2 diabetes mellitus [6045409]   Estimated Length of Stay: > or = to 2 midnights   Tentative Discharge Plan?: Home or Self Care [1]   Patient Class: Inpatient [101]                This note was generated by the Epic EMR system/ Dragon speech recognition and may contain inherent errors or omissions not intended by the user. Grammatical errors, random word insertions, deletions and pronoun  errors  are occasional consequences of this technology due to software limitations. Not all errors are caught or corrected. If there are questions or concerns about the content of this note or information contained within the body of this dictation they should be addressed directly with the author for clarification       Tenny Craw, MD  09/21/18 1942

## 2018-09-21 NOTE — ED Notes (Signed)
Contact Uncle after room assignment  [name] [phone]

## 2018-09-21 NOTE — ED Notes (Signed)
[facility] ED NURSING NOTE FOR THE RECEIVING INPATIENT NURSE   ED NURSE Rafael Bihari 1610   ED CHARGE RN [phone]   ADMISSION INFORMATION   [name] is a 48 y.o. female admitted with a diagnosis of:    1. Type 2 diabetes mellitus with hyperglycemia, with long-term current use of insulin    2. Cervical spinal cord compression         Isolation: None   Allergies: Wellbutrin [bupropion]; Enablex [darifenacin]; Gabapentin; Lyrica [pregabalin]; Nucynta [tapentadol]; and Vesicare [solifenacin]   Holding Orders confirmed? Yes   Belongings Documented? Yes   Home medications sent to pharmacy confirmed? No   NURSING CARE   Mental Status: alert and oriented   ADL: Independent with all ADLs   Ambulation: no difficulty   Pertinent Information  and Safety Concerns:      ED Medications: See below, ED ISHAPED (P) Plan for medications administered in the ED   CT / NIH   CT Head ordered on this patient?  No   NIH/Dysphagia assessment done prior to admission? No    VITAL SIGNS   Time BP Temp Pulse Resp SpO2   1052 13/76 97.7 81 12 95   IV LINES   IV Catheter Size: 20 g  Peripheral IV 09/21/18 Left Hand (Active)   Site Assessment Clean;Dry;Intact 09/21/2018 10:50 AM   Line Status Saline Locked;Capped 09/21/2018 10:50 AM   Dressing Status Clean;Dry;Intact 09/21/2018 10:50 AM   Number of days: 0        LAB RESULTS   Labs Reviewed   GLUCOSE WHOLE BLOOD - POCT - Abnormal; Notable for the following components:       Result Value    POCT - Glucose Whole blood 262 (*)     All other components within normal limits   CBC AND DIFFERENTIAL    Narrative:     Replace urinary catheter prior to obtaining the urine culture  if it has been in place for greater than or equal to 14  days:->N/A No Foley  Indications for U/A Reflex to Micro - Reflex to  Culture:->Suprapubic Pain/Tenderness or Dysuria   COMPREHENSIVE METABOLIC PANEL    Narrative:     Replace urinary catheter prior to obtaining the urine culture  if it has been in place for greater than or  equal to 14  days:->N/A No Foley  Indications for U/A Reflex to Micro - Reflex to  Culture:->Suprapubic Pain/Tenderness or Dysuria   URINALYSIS REFLEX TO MICROSCOPIC EXAM - REFLEX TO CULTURE    Narrative:     Replace urinary catheter prior to obtaining the urine culture  if it has been in place for greater than or equal to 14  days:->N/A No Foley  Indications for U/A Reflex to Micro - Reflex to  Culture:->Suprapubic Pain/Tenderness or Dysuria   HEMOLYSIS INDEX    Narrative:     Replace urinary catheter prior to obtaining the urine culture  if it has been in place for greater than or equal to 14  days:->N/A No Foley  Indications for U/A Reflex to Micro - Reflex to  Culture:->Suprapubic Pain/Tenderness or Dysuria   GFR    Narrative:     Replace urinary catheter prior to obtaining the urine culture  if it has been in place for greater than or equal to 14  days:->N/A No Foley  Indications for U/A Reflex to Micro - Reflex to  Culture:->Suprapubic Pain/Tenderness or Dysuria

## 2018-09-21 NOTE — ED Triage Notes (Signed)
Pt. From home scheduled for c-spine surgery this Saturday (3/14), physician requesting blood sugar control prior to surgery.  Pt. BS 262 in triage.  Pt. Diabetes controlled by meds and insulin.  Pt. Has not taken medications this AM.  Pt. In NAD.

## 2018-09-21 NOTE — Plan of Care (Signed)
Problem: Safety  Goal: Patient will be free from injury during hospitalization  Outcome: Progressing  Flowsheets (Taken 09/21/2018 1650)  Patient will be free from injury during hospitalization : Assess patient's risk for falls and implement fall prevention plan of care per policy; Provide and maintain safe environment; Ensure appropriate safety devices are available at the bedside; Include patient/ family/ care giver in decisions related to safety; Hourly rounding  Goal: Patient will be free from infection during hospitalization  Outcome: Progressing  Flowsheets (Taken 09/21/2018 1650)  Free from Infection during hospitalization: Monitor lab/diagnostic results; Assess and monitor for signs and symptoms of infection; Monitor all insertion sites (i.e. indwelling lines, tubes, urinary catheters, and drains); Encourage patient and family to use good hand hygiene technique     Problem: Pain  Goal: Pain at adequate level as identified by patient  Outcome: Progressing  Flowsheets (Taken 09/21/2018 1650)  Pain at adequate level as identified by patient: Identify patient comfort function goal; Assess for risk of opioid induced respiratory depression, including snoring/sleep apnea. Alert healthcare team of risk factors identified.; Assess pain on admission, during daily assessment and/or before any "as needed" intervention(s); Reassess pain within 30-60 minutes of any procedure/intervention, per Pain Assessment, Intervention, Reassessment (AIR) Cycle; Evaluate if patient comfort function goal is met; Evaluate patient's satisfaction with pain management progress; Offer non-pharmacological pain management interventions; Consult/collaborate with Pain Service; Consult/collaborate with Physical Therapy, Occupational Therapy, and/or Speech Therapy; Include patient/patient care companion in decisions related to pain management as needed     Problem: Side Effects from Pain Analgesia  Goal: Patient will experience minimal side effects  of analgesic therapy  Outcome: Progressing  Flowsheets (Taken 09/21/2018 1650)  Patient will experience minimal side effects of analgesic therapy: Assess for changes in cognitive function; Prevent/manage side effects per LIP orders (i.e. nausea, vomiting, pruritus, constipation, urinary retention, etc.); Evaluate for opioid-induced sedation with appropriate assessment tool (i.e. POSS); Monitor/assess patient's respiratory status (RR depth, effort, breath sounds)     Problem: Discharge Barriers  Goal: Patient will be discharged home or other facility with appropriate resources  Outcome: Progressing  Flowsheets (Taken 09/21/2018 1650)  Discharge to home or other facility with appropriate resources: Provide appropriate patient education; Provide information on available health resources; Initiate discharge planning  Note:   Patient alert and oriented x4, vitals monitored, afebrile.  Blood sugar monitored and insulin administered per sliding scale.  Due medications administered.  Patient reported pain on neck, medicated with prn pain med with good relief.  Patient awaiting to be seen by surgeon, scheduled for c-spine surgery on Saturday.  Will continue to monitor.

## 2018-09-21 NOTE — Progress Notes (Signed)
Patient admitted from ER with hyperglycemia.  Patient is scheduled for c-spine surgery on Saturday, 09/23/18.  Surgeon wants patient's blood sugar stable before procedure. Vitals stable, afebrile.  Will continue with hourly rounding.

## 2018-09-21 NOTE — H&P (Signed)
ADMISSION HISTORY AND PHYSICAL EXAM    Date: 09/21/18   Patient Name: [name]  Attending Physician: Christophe Louis, DO    Assessment:   Insulin-dependent diabetes mellitus uncontrolled hemoglobin A1c 10.6  Cervical disc herniation and plan for C5-C7 ACDF by neurosurgery on 09/23/18  Chronic neck pain and numbness of finger  Hypertension  Hyperlipidemia  Obstructive sleep apnea on CPAP  History of congestive heart failure  History of left buttock cyst recently removed and skin dry now    Plan:   Start Lantus 50 units at bedtime and 10 units in the morning  High intensity sliding scale insulin  Neurosurgery consulted and patient was admitted for aggressive blood sugar control prior surgery  Continue hydrochlorothiazide, Inderal metformin, glipizide, simvastatin and other home medication  DVT prophylaxis with Lovenox  Plan of care explained in detail to patient    Chief complaint:   Uncontrolled diabetes mellitus and left upper extremity numbness    History is obtained from the patient and also review of the hospital record.    History of Present Illness:     [name] is a 48 y.o. female with past medical history of diabetes mellitus type 2 uncontrolled hypertension, hyperlipidemia, cervical disc herniation, neck pain with numbness of the left upper extremity finger was seen by neurosurgery and is scheduled for surgery on 09/23/18.  Patient blood sugars are controlled and admitted to optimize diabetes prior surgery.  Patient complains of chronic neck pain and numbness of the left hand fingers.  She also has numbness on the right side and also numbness of the toes.  She was scheduled for surgery in January but canceled because blood sugar was not controlled.  Her recent hemoglobin A1c on 09/18/2018 was 10.6.  She denies fever, chills, nausea, vomiting.  Patient recently had left buttock cyst removed and the skin is dry now.    Past Medical History:     Past Medical History:   Diagnosis Date    CHF  (congestive heart failure)     diastolic, per pt never had acute episode of CHF    Complication of anesthesia     almost chewed through her tongue and severe chills in past    Coronary artery disease     mild non-obstructive ASCAD on heart cath report 12/2015    Depression     Diabetes mellitus     Fibromyalgia     Hidradenitis     Hydradenitis     Hyperlipidemia     Hypertension     Malignant neoplasm of skin     basal/ squamos    Neuropathy     Sleep apnea     Type 2 diabetes mellitus, controlled        Past Surgical History:     Past Surgical History:   Procedure Laterality Date    BREAST SURGERY  1998    reduction x2    CARDIAC SURGERY  12/2015    L heart cath, diagnosis mild non-obstrutive ASCAD    CESAREAN SECTION  2008    with tummy tuck    COSMETIC SURGERY  1990s,2000s    thigh 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    HAND SURGERY Right     x3, cyst    INCISION AND DRAINAGE OF WOUND      multiple I&D due to hidradenitis    TONSILLECTOMY         Family History:     Family History   Problem Relation  Age of Onset    Hyperlipidemia Mother     Alcohol abuse Father     Hyperlipidemia Father        Social History:     Socioeconomic History    Marital status: Single   Tobacco Use    Smoking status: Former Smoker     Packs/day: 1.50     Years: 30.00     Pack years: 45.00     Last attempt to quit: 09/21/2018    Smokeless tobacco: Never Used   Substance and Sexual Activity    Alcohol use: Never     Frequency: Never    Drug use: Never    Sexual activity: Not on file       Allergies:     Allergies   Allergen Reactions    Wellbutrin [Bupropion] Other (See Comments)     tachycardia    Enablex [Darifenacin] Rash    Gabapentin Rash    Lyrica [Pregabalin] Rash    Nucynta [Tapentadol] Rash     Instant release only; does well with extended release    Vesicare [Solifenacin] Rash       Home Medications:     Medications Prior to Admission   Medication Sig Dispense Refill Last Dose    Blood  Glucose Monitoring Suppl (GLUCOCOM BLOOD GLUCOSE MONITOR) Device USE TO CHECK BLOOD SUGARS UP TO THREE TIMES DAILY   Taking    Cholecalciferol (VITAMIN D3) 2000 UNIT capsule Take 2,000 Units by mouth nightly   Taking    DULoxetine (CYMBALTA) 30 MG capsule Take 30 mg by mouth nightly       glipiZIDE XL (GLUCOTROL XL) 5 MG 24 hr tablet Take 5 mg by mouth nightly      Taking    glucose blood (ONETOUCH VERIO) test strip Check blood sugars 3 times daily.   Diagnosis: Uncontrolled Type 2 DM   Taking    glucose blood test strip 1 each by Other route   Taking    hydroCHLOROthiazide (HYDRODIURIL) 25 MG tablet Take 25 mg by mouth nightly      Taking    insulin glargine (BASAGLAR KWIKPEN) 100 UNIT/ML injection pen Inject 52 Units into the skin nightly      Taking    Insulin Pen Needle (BD PEN NEEDLE NANO U/F) 32G X 4 MM Misc USE AS DIRECTED DAILY   Taking    isosorbide mononitrate (IMDUR) 30 MG 24 hr tablet Take 30 mg by mouth nightly      Taking    Lancets Misc USE TO CHECK BLOOD SUGARS UP TO THREE TIMES DAILY   Taking    LORazepam (ATIVAN) 2 MG tablet Take 2 mg by mouth daily as needed      Taking    metFORMIN (GLUCOPHAGE) 1000 MG tablet Take 1,000 mg by mouth 2 (two) times daily with meals      Taking    NUCYNTA ER 150 MG Tablet SR 12 hr Take 150 mg by mouth 2 (two) times daily      Taking    simvastatin (ZOCOR) 40 MG tablet Take 40 mg by mouth nightly      Taking    triazolam (HALCION) 0.125 MG tablet Take 0.125 mg by mouth nightly as needed      Taking     Primary care physician:   Dione Plover, NP    Review of Systems:   General:  Fever/chills: Absent   Head: Negative headache  Eyes: No Blurred vision   ENMT: Negative  for tinnitus,nasal discharge,sore throat  Respiratory: Cough/ SOB- Absent  Cardiac: Chest pain/SOB- absent  Gastrointestinal: Nausea/vomiting/diarrhea/constipation-Absent, Abdominal pain- absent  Genitourinary: No burning micturition.   Musculoskeletal: Positive neck pain  CNS/Neurological:No  headache, positive left hand finger numbness and bilateral toes numbness  Hematologic: Negative for bruising  Psychiatric: Negative for depression   Skin: Negative for rash     Physical Exam:   BP 123/87    Pulse 77    Temp 97.9 F (36.6 C) (Oral)    Resp 15    Ht 1.575 m (5\' 2" )    Wt 93 kg (205 lb)    SpO2 97%    BMI 37.49 kg/m     General appearance - alert, and in no distress  Mental status - alert, oriented to person, place, and time  Head: Normocephalic, atraumatic  Eyes - pupils equal and reactive, extraocular eye movements intact  Ears - hearing intact  Nose - normal, no erythema, discharge   Mouth - oropharynx clear , mucous membranes moist  Neck - supple, no significant adenopathy  Respiratory: - clear to auscultation, no wheezes, rales   Cardiovascular: Normal S1 & S2, regular rate and rhythm  Abdomen - soft, BS +ve, nontender, nondistended  Neurological - alert, oriented, normal speech, no gross motor deficit, positive left hand finger numbness and bilateral toes numbness  Musculoskeletal - no joint tenderness, deformity or swelling  Extremities - peripheral pulses normal, no pedal edema, no clubbing or cyanosis  Skin - normal coloration and turgor, no rashes    Labs:     Results     Procedure Component Value Units Date/Time    Glucose Whole Blood - POCT [[id number]]  (Abnormal) Collected:  09/21/18 1250     Updated:  09/21/18 1251     POCT - Glucose Whole blood 191 mg/dL     Hemolysis index [[id number]] Collected:  09/21/18 1047     Updated:  09/21/18 1120     Hemolysis Index 8    Narrative:       Replace urinary catheter prior to obtaining the urine culture  if it has been in place for greater than or equal to 14  days:->N/A No Foley  Indications for U/A Reflex to Micro - Reflex to  Culture:->Suprapubic Pain/Tenderness or Dysuria    GFR [[id number]] Collected:  09/21/18 1047     Updated:  09/21/18 1120     EGFR 59.1    Narrative:       Replace urinary catheter prior to obtaining the urine culture  if it  has been in place for greater than or equal to 14  days:->N/A No Foley  Indications for U/A Reflex to Micro - Reflex to  Culture:->Suprapubic Pain/Tenderness or Dysuria    Comprehensive metabolic panel [[id number]]  (Abnormal) Collected:  09/21/18 1047    Specimen:  Blood Updated:  09/21/18 1120     Glucose 241 mg/dL      BUN 62.9 mg/dL      Creatinine 1.0 mg/dL      Sodium 528 mEq/L      Potassium 3.7 mEq/L      Chloride 96 mEq/L      CO2 27 mEq/L      Calcium 9.7 mg/dL      Protein, Total 7.0 g/dL      Albumin 3.8 g/dL      AST (SGOT) 19 U/L      ALT 23 U/L      Alkaline Phosphatase 73 U/L  Bilirubin, Total 1.0 mg/dL      Globulin 3.2 g/dL      Albumin/Globulin Ratio 1.2     Anion Gap 12.0    Narrative:       Replace urinary catheter prior to obtaining the urine culture  if it has been in place for greater than or equal to 14  days:->N/A No Foley  Indications for U/A Reflex to Micro - Reflex to  Culture:->Suprapubic Pain/Tenderness or Dysuria    UA Reflex to Micro - Reflex to Culture [[id number]]  (Abnormal) Collected:  09/21/18 1049     Updated:  09/21/18 1112     Urine Type Urine, Clean Ca     Color, UA Yellow     Clarity, UA Hazy     Specific Gravity UA 1.025     Urine pH 5.0     Leukocyte Esterase, UA Negative     Nitrite, UA Negative     Protein, UR Negative     Glucose, UA 500     Ketones UA Negative     Urobilinogen, UA Negative mg/dL      Bilirubin, UA Negative     Blood, UA Negative     RBC, UA 0 - 2 /hpf      WBC, UA 0 - 5 /hpf      Squamous Epithelial Cells, Urine 6 - 10 /hpf      Hyaline Casts, UA 0 - 3 /lpf      Urine Mucus Present    Narrative:       Replace urinary catheter prior to obtaining the urine culture  if it has been in place for greater than or equal to 14  days:->N/A No Foley  Indications for U/A Reflex to Micro - Reflex to  Culture:->Suprapubic Pain/Tenderness or Dysuria    CBC and differential [[id number]]  (Abnormal) Collected:  09/21/18 1047    Specimen:  Blood Updated:  09/21/18  1107     WBC 9.52 x10 3/uL      Hgb 15.0 g/dL      Hematocrit 78.2 %      Platelets 265 x10 3/uL      RBC 4.81 x10 6/uL      MCV 91.3 fL      MCH 31.2 pg      MCHC 34.2 g/dL      RDW 12 %      MPV 10.0 fL      Neutrophils 50.0 %      Lymphocytes Automated 38.3 %      Monocytes 5.7 %      Eosinophils Automated 4.8 %      Basophils Automated 0.7 %      Immature Granulocyte 0.5 %      Nucleated RBC 0.0 /100 WBC      Neutrophils Absolute 4.75 x10 3/uL      Abs Lymph Automated 3.65 x10 3/uL      Abs Mono Automated 0.54 x10 3/uL      Abs Eos Automated 0.46 x10 3/uL      Absolute Baso Automated 0.07 x10 3/uL      Absolute Immature Granulocyte 0.05 x10 3/uL      Absolute NRBC 0.00 x10 3/uL     Narrative:       Replace urinary catheter prior to obtaining the urine culture  if it has been in place for greater than or equal to 14  days:->N/A No Foley  Indications for U/A Reflex to Micro - Reflex to  Culture:->Suprapubic Pain/Tenderness or Dysuria    Glucose Whole Blood - POCT [[id number]]  (Abnormal) Collected:  09/21/18 0927     Updated:  09/21/18 0929     POCT - Glucose Whole blood 262 mg/dL           Rads:   No results found.    Christophe Louis, DO  Internal Medicine   09/21/18   10:15 AM  Spectra link [phone]  Answering service: [phone]      *This note was generated by the Epic EMR system/ Dragon speech recognition and may contain inherent errors or omissions not intended by the user. Grammatical errors, random word insertions, deletions, pronoun errors and incomplete sentences are occasional consequences of this technology due to software limitations. Not all errors are caught or corrected. If there are questions or concerns about the content of this note or information contained within the body of this dictation they should be addressed directly with the author for clarification

## 2018-09-22 ENCOUNTER — Encounter: Payer: Self-pay | Admitting: Anesthesiology

## 2018-09-22 ENCOUNTER — Inpatient Hospital Stay: Payer: BC Managed Care – PPO

## 2018-09-22 DIAGNOSIS — G992 Myelopathy in diseases classified elsewhere: Secondary | ICD-10-CM

## 2018-09-22 DIAGNOSIS — E1165 Type 2 diabetes mellitus with hyperglycemia: Principal | ICD-10-CM

## 2018-09-22 LAB — PT/INR
PT INR: 0.9 (ref 0.9–1.1)
PT: 12.5 s — ABNORMAL LOW (ref 12.6–15.0)

## 2018-09-22 LAB — CBC AND DIFFERENTIAL
Absolute NRBC: 0 10*3/uL (ref 0.00–0.00)
Basophils Absolute Automated: 0.07 10*3/uL (ref 0.00–0.08)
Basophils Automated: 0.8 %
Eosinophils Absolute Automated: 0.36 10*3/uL (ref 0.00–0.44)
Eosinophils Automated: 4.2 %
Hematocrit: 44.2 % — ABNORMAL HIGH (ref 34.7–43.7)
Hgb: 14.9 g/dL — ABNORMAL HIGH (ref 11.4–14.8)
Immature Granulocytes Absolute: 0.03 10*3/uL (ref 0.00–0.07)
Immature Granulocytes: 0.3 %
Lymphocytes Absolute Automated: 3.58 10*3/uL — ABNORMAL HIGH (ref 0.42–3.22)
Lymphocytes Automated: 41.4 %
MCH: 30.7 pg (ref 25.1–33.5)
MCHC: 33.7 g/dL (ref 31.5–35.8)
MCV: 91.1 fL (ref 78.0–96.0)
MPV: 9.8 fL (ref 8.9–12.5)
Monocytes Absolute Automated: 0.54 10*3/uL (ref 0.21–0.85)
Monocytes: 6.3 %
Neutrophils Absolute: 4.06 10*3/uL (ref 1.10–6.33)
Neutrophils: 47 %
Nucleated RBC: 0 /100 WBC (ref 0.0–0.0)
Platelets: 260 10*3/uL (ref 142–346)
RBC: 4.85 10*6/uL (ref 3.90–5.10)
RDW: 12 % (ref 11–15)
WBC: 8.64 10*3/uL (ref 3.10–9.50)

## 2018-09-22 LAB — URINALYSIS REFLEX TO MICROSCOPIC EXAM - REFLEX TO CULTURE
Bilirubin, UA: NEGATIVE
Blood, UA: NEGATIVE
Glucose, UA: 500 — AB
Leukocyte Esterase, UA: NEGATIVE
Nitrite, UA: NEGATIVE
Protein, UR: NEGATIVE
Specific Gravity UA: 1.028 (ref 1.001–1.035)
Urine pH: 5 (ref 5.0–8.0)
Urobilinogen, UA: NEGATIVE mg/dL (ref 0.2–2.0)

## 2018-09-22 LAB — ECG 12-LEAD
Atrial Rate: 91 {beats}/min
P Axis: 59 degrees
P-R Interval: 188 ms
Q-T Interval: 382 ms
QRS Duration: 104 ms
QTC Calculation (Bezet): 469 ms
R Axis: 83 degrees
T Axis: 25 degrees
Ventricular Rate: 91 {beats}/min

## 2018-09-22 LAB — BASIC METABOLIC PANEL
Anion Gap: 11 (ref 5.0–15.0)
BUN: 13 mg/dL (ref 7.0–19.0)
CO2: 29 mEq/L (ref 22–29)
Calcium: 10.2 mg/dL (ref 8.5–10.5)
Chloride: 97 mEq/L — ABNORMAL LOW (ref 100–111)
Creatinine: 0.8 mg/dL (ref 0.6–1.0)
Glucose: 209 mg/dL — ABNORMAL HIGH (ref 70–100)
Potassium: 3.8 mEq/L (ref 3.5–5.1)
Sodium: 137 mEq/L (ref 136–145)

## 2018-09-22 LAB — GLUCOSE WHOLE BLOOD - POCT
Whole Blood Glucose POCT: 246 mg/dL — ABNORMAL HIGH (ref 70–100)
Whole Blood Glucose POCT: 323 mg/dL — ABNORMAL HIGH (ref 70–100)
Whole Blood Glucose POCT: 206 mg/dL — ABNORMAL HIGH (ref 70–100)
Whole Blood Glucose POCT: 166 mg/dL — ABNORMAL HIGH (ref 70–100)

## 2018-09-22 LAB — HCG QUANTITATIVE: hCG, Quant.: 2

## 2018-09-22 LAB — HEMOLYSIS INDEX: Hemolysis Index: 10 (ref 0–18)

## 2018-09-22 LAB — APTT: PTT: 28 s (ref 23–37)

## 2018-09-22 LAB — TYPE AND SCREEN
AB Screen Gel: NEGATIVE
ABO Rh: O POS

## 2018-09-22 LAB — GFR: EGFR: >60

## 2018-09-22 MED ORDER — INSULIN GLARGINE 100 UNIT/ML SC SOLN
40.00 [IU] | Freq: Every evening | SUBCUTANEOUS | Status: DC
Start: 2018-09-22 — End: 2018-09-24
  Administered 2018-09-22 – 2018-09-23 (×2): 40 [IU] via SUBCUTANEOUS
  Filled 2018-09-22 (×2): qty 40

## 2018-09-22 MED ORDER — SENNA 8.6 MG PO TABS
8.6000 mg | ORAL_TABLET | Freq: Every evening | ORAL | Status: DC | PRN
Start: 2018-09-22 — End: 2018-09-24

## 2018-09-22 MED ORDER — SODIUM CHLORIDE 0.9 % IV SOLN
0.05 ug/kg/min | INTRAVENOUS | Status: DC
Start: 2018-09-22 — End: 2018-09-24
  Filled 2018-09-22 (×12): qty 2000

## 2018-09-22 MED ORDER — ONDANSETRON HCL 4 MG PO TABS
4.0000 mg | ORAL_TABLET | Freq: Four times a day (QID) | ORAL | Status: DC | PRN
Start: 2018-09-22 — End: 2018-09-24
  Administered 2018-09-22: 07:00:00 4 mg via ORAL
  Filled 2018-09-22: qty 1

## 2018-09-22 MED ORDER — REMIFENTANIL HCL 1 MG IV SOLR
0.50 ug/kg | Freq: Once | INTRAVENOUS | Status: DC
Start: 2018-09-23 — End: 2018-09-22

## 2018-09-22 MED ORDER — BISACODYL 10 MG RE SUPP
10.00 mg | Freq: Every day | RECTAL | Status: DC
Start: 2018-09-22 — End: 2018-09-24
  Filled 2018-09-22: qty 1

## 2018-09-22 MED ORDER — SODIUM CHLORIDE 0.9 % IV SOLN
INTRAVENOUS | Status: DC
Start: 2018-09-23 — End: 2018-09-24

## 2018-09-22 MED ORDER — HYDROCODONE-ACETAMINOPHEN 5-325 MG PO TABS
2.00 | ORAL_TABLET | Freq: Once | ORAL | Status: AC
Start: 2018-09-22 — End: 2018-09-22
  Administered 2018-09-22: 20:00:00 2 via ORAL
  Filled 2018-09-22: qty 2

## 2018-09-22 MED ORDER — INSULIN GLARGINE 100 UNIT/ML SC SOLN
20.00 [IU] | Freq: Once | SUBCUTANEOUS | Status: AC
Start: 2018-09-22 — End: 2018-09-22
  Administered 2018-09-22: 17:00:00 20 [IU] via SUBCUTANEOUS
  Filled 2018-09-22: qty 20

## 2018-09-22 MED ORDER — MORPHINE SULFATE 2 MG/ML IJ/IV SOLN (WRAP)
2.0000 mg | Status: DC | PRN
Start: 2018-09-22 — End: 2018-09-24
  Administered 2018-09-23 – 2018-09-24 (×3): 2 mg via INTRAVENOUS
  Filled 2018-09-22 (×3): qty 1

## 2018-09-22 MED ORDER — INSULIN GLARGINE 100 UNIT/ML SC SOLN
20.00 [IU] | Freq: Every morning | SUBCUTANEOUS | Status: DC
Start: 2018-09-22 — End: 2018-09-24
  Administered 2018-09-22 – 2018-09-24 (×3): 20 [IU] via SUBCUTANEOUS
  Filled 2018-09-22 (×2): qty 20

## 2018-09-22 MED ORDER — TAPENTADOL HCL ER 150 MG PO TB12
150.00 mg | ORAL_TABLET | Freq: Two times a day (BID) | ORAL | Status: DC
Start: 2018-09-22 — End: 2018-09-24
  Administered 2018-09-22 – 2018-09-24 (×3): 150 mg via ORAL

## 2018-09-22 MED ORDER — DEXMEDETOMIDINE HCL 200 MCG/2ML IV SOLN
0.40 ug/kg/h | INTRAVENOUS | Status: DC
Start: 2018-09-23 — End: 2018-09-24
  Administered 2018-09-23: 10:00:00 0.4 ug/kg/h via INTRAVENOUS
  Filled 2018-09-22: qty 4

## 2018-09-22 MED ORDER — ONDANSETRON HCL 4 MG/2ML IJ SOLN
4.00 mg | Freq: Four times a day (QID) | INTRAMUSCULAR | Status: DC | PRN
Start: 2018-09-22 — End: 2018-09-24

## 2018-09-22 NOTE — Progress Notes (Signed)
INTERNAL MEDICINE PROGRESS NOTE    Date: 03/13/209:11 AM  Patient Name:[name],[name] 48 y.o. female admitted with Diabetes mellitus, insulin dependent (IDDM), uncontrolled  Patient status: Inpatient  Hospital Day: 1     Assessment:   Insulin-dependent diabetes mellitus uncontrolled hemoglobin A1c 10.6  Cervical disc herniation and plan for C5-C7 ACDF by neurosurgery on 09/23/18  Chronic neck pain -pain uncontrolled  Hypertension  Hyperlipidemia  Obstructive sleep apnea on CPAP  History of congestive heart failure  History of left buttock cyst recently removed and skin dry now  Rash likely sec to tramadol    Plan:   Neurosurgery consulted plan for cervical spine surgery tomorrow  Continue oxycodone and consult palliative care for pain management  Start Lantus 20 units in the morning and 50 at night and monitor blood sugar  Discontinue tramadol as patient developed rash  Continue high intensity sliding scale insulin  Continue other management  Discharge plan once cleared by neurosurgery  Plan of care explained in detail to patient and RN    Subjective:   Complain of neck pain uncontrolled.  Patient also mentioned having rash after the start of tramadol.  She wanted to know if she can get the surgery tomorrow or not.        Hospital problems:  Principal Problem:    Diabetes mellitus, insulin dependent (IDDM), uncontrolled  Active Problems:    Hyperglycemia due to type 2 diabetes mellitus      Medications:      Scheduled Meds: PRN Meds:    DULoxetine, 20 mg, Oral, BID  enoxaparin, 40 mg, Subcutaneous, Daily  hydroCHLOROthiazide, 25 mg, Oral, QHS  insulin glargine, 20 Units, Subcutaneous, QAM  insulin glargine, 50 Units, Subcutaneous, QHS  insulin lispro, 1-6 Units, Subcutaneous, QHS  insulin lispro, 2-10 Units, Subcutaneous, TID AC  isosorbide mononitrate, 30 mg, Oral, QHS  metFORMIN, 1,000 mg, Oral, BID Meals  nicotine, 1 patch, Transdermal, Daily  simvastatin, 40 mg, Oral, QHS  traMADol, 50 mg, Oral, Q8H  vitamin D  (cholecalciferol), 25 mcg, Oral, Daily        Continuous Infusions:   dextrose, 15 g of glucose, PRN    And  glucagon (rDNA), 1 mg, PRN    And  dextrose, 125 mL, PRN  HYDROcodone-acetaminophen, 1 tablet, Q4H PRN  hydrOXYzine, 10 mg, Q6H PRN  naloxone, 0.2 mg, PRN  ondansetron, 4 mg, Q6H PRN    Or  ondansetron, 4 mg, Q6H PRN  zolpidem, 2.5 mg, QHS PRN              Review of Systems:   General:  Fever/chills: Absent  Head:Negative for Headache   Eyes: No blurred vision   ENT: Negative for tinnitus,nasal congestion/discharge,sore throat  Respiratory: Cough/ SOB- Absent  Cardiac: Chest pain/SOB- absent  Gastrointestinal: Nausea/vomiting/diarrhea/constipation-Absent, Abdominal pain- absent  Genitourinary: No burning micturition.   Musculoskeletal: Positive neck pain  CNS/Neurological:No headache, positive neck pains   Skin: Negative for rash  Hematologic: Negative for bruising  Psychiatric: Negative for depression     Physical Exam:   BP 118/77    Pulse 82    Temp 97.1 F (36.2 C) (Oral)    Resp 16    Ht 1.575 m (5\' 2" )    Wt 98.4 kg (217 lb)    SpO2 96%    BMI 39.69 kg/m     Intake and Output Summary (Last 24 hours) at Date Time    Intake/Output Summary (Last 24 hours) at 09/22/2018 1610  Last data filed at  09/22/2018 0600  Gross per 24 hour   Intake 1290 ml   Output 0 ml   Net 1290 ml       General appearance - alert, and in moderate distress  Head: NC/AT  Eyes:  PERL b/l  ENT: Hearing normal, no nasal discharge, o/p clear, mucous membrane moist  Neck -limited movement due to pain  Chest - clear to auscultation bilateral  Heart - normal S1 and S2 and regular rhythm  Abdomen - bowel sounds +ve, soft, non distended  Extremities - no pedal edema  Neurological - Alert, able to move all 4 extremities  Skin: Mild erythematous rash on chest wall    Labs:     Recent Labs   Lab 09/21/18  1047 09/18/18  0820   WBC 9.52* 11.40*   Hgb 15.0* 15.6*   Hematocrit 43.9* 45.7*   Platelets 265 315   MCV 91.3 92.7   Neutrophils 50.0 59.0      Recent Labs   Lab 09/21/18  1047 09/18/18  0820   Sodium 135* 131*   Potassium 3.7 4.3   Chloride 96* 92*   CO2 27 27   BUN 16.0 14.0   Creatinine 1.0 1.0   Glucose 241* 302*   Calcium 9.7 10.5   Protein, Total 7.0  --    Albumin 3.8  --    AST (SGOT) 19  --    ALT 23  --    Alkaline Phosphatase 73  --    Bilirubin, Total 1.0  --      Glucose:    Recent Labs   Lab 09/21/18  1047 09/18/18  0820   Glucose 241* 302*     Recent Labs   Lab 09/18/18  0820   PT 12.2*   PT INR 0.9     Rads:   No results found.      Christophe Louis, DO  Internal Medicine  09/22/18  9:11 AM  Spectra link [phone]  Answering service: [phone]    *This note was generated by the Epic EMR system/ Dragon speech recognition and may contain inherent errors or omissions not intended by the user. Grammatical errors, random word insertions, deletions, pronoun errors and incomplete sentences are occasional consequences of this technology due to software limitations. Not all errors are caught or corrected. If there are questions or concerns about the content of this note or information contained within the body of this dictation they should be addressed directly with the author for clarification.

## 2018-09-22 NOTE — Plan of Care (Signed)
Problem: Safety  Goal: Patient will be free from injury during hospitalization  Outcome: Progressing  Goal: Patient will be free from infection during hospitalization  Outcome: Progressing     Problem: Pain  Goal: Pain at adequate level as identified by patient  Outcome: Progressing     Problem: Side Effects from Pain Analgesia  Goal: Patient will experience minimal side effects of analgesic therapy  Outcome: Progressing     Problem: Discharge Barriers  Goal: Patient will be discharged home or other facility with appropriate resources  Outcome: Progressing     Problem: Psychosocial and Spiritual Needs  Goal: Demonstrates ability to cope with hospitalization/illness  Outcome: Progressing     Problem: Moderate/High Fall Risk Score >5  Goal: Patient will remain free of falls  Outcome: Progressing     Problem: Diabetes: Glucose Imbalance  Goal: Blood glucose stable at established goal  Outcome: Progressing    A&Ox4, RA, IV saline locked, self assist. Plan if care for procedure with neurosurgery tomorrow, provided patient is medically cleared. Has been having high blood sugars today, in 200s-300s. Patient does not feel pain is adequately controlled. Palliative care has been consulted.

## 2018-09-22 NOTE — Anesthesia Preprocedure Evaluation (Addendum)
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  Pt denies any problems with chest pain.         48 year old female with multiple medical problems including uncontrolled DM, HTN, HLD, neuropathy, depression and noncompliance with medical recommendations.     Pt has been followed by Dr. [name] since Jan for cervical spinal stenosis with progressive myelopathy.  Pt was initially scheduled for surgery 08/07/18 however case was postponed due to uncontrolled DM.  Pt sent to her PCP for optimization.  Pt has not been compliant with optimization of blood glucose.   She was last seen by PCP 09/14/18 who was aware of her upcoming surgery.      Pt's last office visit with Dr. [name] was 09/12/2018.  The following is from his office note   "(48 y.o. female presenting with symptoms of myelopathy, as well as bilateral arm pain and burning and numbness. She is myelopathic on examination with evidence of weakness in her left hand.  She has been having worsening falls since her last follow-up with me. 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  The patient was scheduled for surgery with me for C5-6 and C6-C7 ACDF. However, the patient was found to be very high risk for surgical intervention based on her medical clearance, both because of her severe hyperglycemia and her extensive smoking.           I had an extensive discussion with the patient regarding the condition. I showed her again the imaging, and went over its findings in detail. I explained to the patient that her more recent falls might be caused by worsening myelopathy.  I  again explained to her that while surgery is important for her, I do worry about her recovery and potential risks and side effects of surgery because she continues to smoke and because of her highly uncontrolled diabetes.  I explained to the patient that specifically she is at high risk of postop infection because of her diabetes as well as instrumentation failure and failure of the fusion because of her smoking.  In addition to this risks, there also the usual risks of surgery which include but are not limited to death, bleeding, spinal cord injury, nerve injury, paralysis, weakness, numbness, CSF leak, pseudomeningocele formation, heart or lung complications from being under anesthesia, failure of surgery to improve her symptoms, or needing surgery in the future.  The patient voiced good understanding of all these risks, and in particular all the risks associated with her nicotine use and her diabetes.  She is adamant that she does not want to wait and would like the surgery done as soon as possible.  We will be sending her back again for medical clearance and preoperative blood work.  All questions were answered.)"    Of note, I counseled the patient extensively on quitting smoking but she was not interested in  quitting smoking at this time.    Due to compliance issues and high glucose pt was admitted 09/20/17 for medical optimization.  Pt was admitted by Dr. Richardson Chiquito who has made multiple adjustment to her insulin.   She has now been in the hospital under his care for 2 days in preparation for surgery today.  Her blood glucose has improved.     Discussion with pt regarding HIGH risk for perioperative complications due to continued smoking and poorly controlled DM.  Increased risk factors have been expressed to pt by multiple practitioners .Pt understands that she is high risk and wishes to proceed.  It has also been stressed to pt that she needs better glucose controlled after discharge.                 Anesthesia  Plan    ASA 3     general               (Possible A Line  Chart reviewed, pt examined.  Pt agitated by being inpatient prior to OR but after discussing the importance of pre op evaluation and surgical optimization she appeared to understand and consented to anesthesia.  Pt has been leaving hospital smoke but I feel that will not stop prior to any surgical date.  Pt has not sign of being in heart failure.  Blood sugar levels are correcting.  I fear this patient may leave the hospital prior to being medically cleared if that period is greater than one day post op.  This Mrs Bejar is cleared for scheduled surgery with Dr [name]. )      intravenous induction   Detailed anesthesia plan: general endotracheal  Monitors/Adjuncts: arterial line      Post op pain management: per surgeon    informed consent obtained    Plan discussed with CRNA.    ECG reviewed  pertinent labs reviewed             Signed by: Alvan Dame 09/22/18 3:14 PM

## 2018-09-22 NOTE — Progress Notes (Signed)
Receive call from Dr. Katherina Right from anesthesia requesting update on EKG that was ordered yesterday. Informed of results of EKG and that there is a copy in the chart. She requested that I confirm that Dr. Richardson Chiquito is aware of EKG, and requests Dr. Richardson Chiquito to specify in his note today that she is medically cleared for surgery tomorrow, especially concerning her blood sugars. Dr. Richardson Chiquito paged about all of the above.

## 2018-09-22 NOTE — UM Notes (Signed)
[employer]  Admit to Inpatient (Order [id number]) 09/21/18 1012  Admitting Physician Vedia Coffer A    Diagnosis Hyperglycemia due to type 2 diabetes mellitus    Estimated Length of Stay > or = to 2 midnights    Tentative Discharge Plan? Home or Self Care    Patient Class Inpatient      UTILIZATION REVIEW CONTACT : Eddie North BSN, RN, ACM-RN  Utilization Review Case Manager II / Case Management  [facility]  [address]  [place], [place] [address]  T [phone]  Judie Petit [phone]   F [phone]    Email: [email or web address]  NPI: [id number] Tax ID:  [id number]    PATIENT NAME: [name], [name]  DOB: Sep 13, 1970  PMH:  has a past medical history of CHF (congestive heart failure), Complication of anesthesia, Coronary artery disease, Depression, Diabetes mellitus, Fibromyalgia, Hidradenitis, Hydradenitis, Hyperlipidemia, Hypertension, Malignant neoplasm of skin, Neuropathy, Sleep apnea, and Type 2 diabetes mellitus, controlled.  PSH:  has a past surgical history that includes Cesarean section (2008); Tonsillectomy; Breast surgery (1998); Cosmetic surgery (1990s,2000s); Hand surgery (Right); Cardiac surgery (12/2015); and Incision and drainage of wound.    ADMISSION REVIEW   History of present illness: Pt is a 48 y.o. female arrived at [facility] on 09/21/2018 at 0928.and admitted to 23S    [name] is a 48 y.o. female pw poorly controlled diabetes.  Patient reports diabetes diagnosis over 10 years ago.  She is currently maintained on insulin 50 units at night, metformin 1000 mg taken only at night and glipizide.  She reports her last A1c was over 10.  She denies urgency, frequency, polydipsia or polyuria.  She recently moved here a few months ago and established with PMD in the last week.  She is scheduled for cervical spine surgery this Saturday and was advised to come to the hospital for blood sugar optimization prior to surgery.    Arrival VS: Vitals BP: 123/87, Temp: 97.9 F  (36.6 C), Temp Source: Oral, Heart Rate: 77, Resp Rate: 15, SpO2: 97 %, Height: 157.5 cm (5\' 2" ), Weight: 93 kg (205 lb)      Temp:  [96.7 F (35.9 C)-98.1 F (36.7 C)]   Heart Rate:  [73-117]   Resp Rate:  [16-18]   BP: (101-142)/(57-85)   SpO2:  [94 %-97 %]   Weight:  [98.4 kg (217 lb)]     Last recorded pain score:  Pain Scale Used: Numeric Scale (0-10)  Pain Score: 8-severe pain       Abnormal Labs:   Lab Results last 48 Hours     Procedure Component Value Units Date/Time    ADULT Urinalysis Reflex to Microscopic Exam - Reflex to Culture [[id number]]  (Abnormal) Collected:  09/22/18 1217     Glucose, UA 500     Ketones UA Trace    Glucose Whole Blood - POCT [[id number]]  (Abnormal) Collected:  09/22/18 1105     POCT - Glucose Whole blood 323 mg/dL     Basic Metabolic Panel [[id number]]  (Abnormal) Collected:  09/22/18 1009     Glucose 209 mg/dL      Chloride 97 mEq/L     Prothrombin time/INR [[id number]]  (Abnormal) Collected:  09/22/18 1009     PT 12.5 sec      PT INR 0.9    CBC and differential [[id number]]  (Abnormal) Collected:  09/22/18 1009     Hgb 14.9 g/dL      Hematocrit 25.4 %  Abs Lymph Automated 3.58 x10 3/uL     Glucose Whole Blood - POCT [[id number]]  (Abnormal) Collected:  09/22/18 0733     Updated:  09/22/18 0814     POCT - Glucose Whole blood 246 mg/dL     Glucose Whole Blood - POCT [[id number]]  (Abnormal) Collected:  09/21/18 2114     POCT - Glucose Whole blood 154 mg/dL     Glucose Whole Blood - POCT [[id number]]  (Abnormal) Collected:  09/21/18 1635     POCT - Glucose Whole blood 235 mg/dL     Glucose Whole Blood - POCT [[id number]]  (Abnormal) Collected:  09/21/18 1250     POCT - Glucose Whole blood 191 mg/dL     GFR [[id number]] Collected:  09/21/18 1047     EGFR 59.1    Comprehensive metabolic panel [[id number]]  (Abnormal) Collected:  09/21/18 1047     Glucose 241 mg/dL      Sodium 536 mEq/L      Chloride 96 mEq/L     UA Reflex to Micro - Reflex to Culture [[id number]]  (Abnormal) Collected:   09/21/18 1049     Glucose, UA 500    CBC and differential [[id number]]  (Abnormal) Collected:  09/21/18 1047     WBC 9.52 x10 3/uL      Hgb 15.0 g/dL      Hematocrit 43.3 %      Abs Lymph Automated 3.65 x10 3/uL      Abs Eos Automated 0.46 x10 3/uL     Glucose Whole Blood - POCT [[id number]]  (Abnormal) Collected:  09/21/18 0927     POCT - Glucose Whole blood 262 mg/dL         Medications in ED:    Medication Administration from 09/21/2018 0925 to 09/21/2018 1139   Date/Time Order Dose Route Action Action by Comments  09/21/2018 1051 sodium chloride 0.9 % bolus 1,000 mL 1,000 mL Intravenous Gordy Savers, RN     MD Notes:  MD note 09/21/2018  Assessment:  Insulin-dependent diabetes mellitus uncontrolled hemoglobin A1c 10.6  Cervical disc herniation and plan for C5-C7 ACDF by neurosurgery on 09/23/18  Chronic neck pain and numbness of finger  Hypertension  Hyperlipidemia  Obstructive sleep apnea on CPAP  History of congestive heart failure  History of left buttock cyst recently removed and skin dry now  Plan:  Start Lantus 50 units at bedtime and 10 units in the morning  High intensity sliding scale insulin  Neurosurgery consulted and patient was admitted for aggressive blood sugar control prior surgery  Continue hydrochlorothiazide, Inderal metformin, glipizide, simvastatin and other home medication  DVT prophylaxis with Lovenox  Plan of care explained in detail to patient    MD note 09/22/2018  Assessment:  Insulin-dependent diabetes mellitus uncontrolled hemoglobin A1c 10.6  Cervical disc herniationandplan for C5-C7 ACDF by neurosurgeryon 09/23/18  Chronic neck pain-pain uncontrolled  Hypertension  Hyperlipidemia  Obstructive sleep apnea on CPAP  History of congestive heart failure  History of left buttock cystrecentlyremoved and skin dry now  Rash likely sec to tramadol  Plan:  Neurosurgery consulted plan for cervical spine surgery tomorrow  Continue oxycodone and consult palliative care for pain  management  Start Lantus 20 units in the morning and 50 at night and monitor blood sugar  Discontinue tramadol as patient developed rash  Continue high intensity sliding scale insulin  Continue other management  Discharge plan once cleared by neurosurgery  Plan of care explained in detail to  patient and RN    Neurosurgery note 09/22/2018  Pt c/o pain to neck, radiating down arm bil with decreases sensation to LUE and toes of bil feet. Pain is rated 7/10 at this time. Pt reports that she takes nucynta for home at home but this isn't available in hospital. Pt frustrated, reports sister is caring for pt's 58 year old twins, one of whom is suicidal.  Assessment:  48yo female with history of diastolicHF, IDDM, HTN, hyperlipidemia, OSA, narcolepsy, depression, hydradenitis with abscess, fibromyalgia, smoker, now with cervical disc herniation, planning for OR tomorrow.  Plan:  Medical management per primary care team Optimize medically for surgery  Need Anesthesia clearance for surgery  No bloodthinners / ASA / NSAIDs   NPO at midnight  Pre-op labs (ordered)  Consent in chart    Orders  Glucose poct, SCD's, I/O's, CPAP, VS    Medications: Scheduled Meds:  Current Facility-Administered Medications   Medication Dose Route Frequency    DULoxetine  20 mg Oral BID    enoxaparin  40 mg Subcutaneous Daily    hydroCHLOROthiazide  25 mg Oral QHS    insulin glargine  20 Units Subcutaneous QAM    insulin glargine  50 Units Subcutaneous QHS    insulin lispro  1-6 Units Subcutaneous QHS    insulin lispro  2-10 Units Subcutaneous TID AC    isosorbide mononitrate  30 mg Oral QHS    metFORMIN  1,000 mg Oral BID Meals    nicotine  1 patch Transdermal Daily    simvastatin  40 mg Oral QHS    vitamin D (cholecalciferol)  25 mcg Oral Daily     Continuous Infusions:  PRN Meds:.Nursing communication: Adult Hypoglycemia Treatment Algorithm **AND** dextrose **AND** dextrose **AND** glucagon (rDNA), Hydrocodone 1 tab po q 4hr x 5,  hydroxyzine 10 mg po q 6 hr x 2, naloxone, ondansetron **OR** ondansetron 4 mg po q 6 hr x 1 , zolpidem

## 2018-09-22 NOTE — Plan of Care (Signed)
Problem: Safety  Goal: Patient will be free from injury during hospitalization  09/22/2018 0244 by Deniece Ree, RN  Outcome: Progressing  Flowsheets (Taken 09/21/2018 1650 by Levan Hurst, RN)  Patient will be free from injury during hospitalization : Assess patient's risk for falls and implement fall prevention plan of care per policy;Provide and maintain safe environment;Ensure appropriate safety devices are available at the bedside;Include patient/ family/ care giver in decisions related to safety;Hourly rounding  09/22/2018 0241 by Deniece Ree, RN  Outcome: Progressing  Flowsheets (Taken 09/21/2018 1650 by Levan Hurst, RN)  Patient will be free from injury during hospitalization : Assess patient's risk for falls and implement fall prevention plan of care per policy;Provide and maintain safe environment;Ensure appropriate safety devices are available at the bedside;Include patient/ family/ care giver in decisions related to safety;Hourly rounding  Goal: Patient will be free from infection during hospitalization  09/22/2018 0244 by Deniece Ree, RN  Outcome: Progressing  Flowsheets (Taken 09/21/2018 1650 by Levan Hurst, RN)  Free from Infection during hospitalization: Monitor lab/diagnostic results;Assess and monitor for signs and symptoms of infection;Monitor all insertion sites (i.e. indwelling lines, tubes, urinary catheters, and drains);Encourage patient and family to use good hand hygiene technique  09/22/2018 0241 by Deniece Ree, RN  Outcome: Progressing  Flowsheets (Taken 09/21/2018 1650 by Levan Hurst, RN)  Free from Infection during hospitalization: Monitor lab/diagnostic results;Assess and monitor for signs and symptoms of infection;Monitor all insertion sites (i.e. indwelling lines, tubes, urinary catheters, and drains);Encourage patient and family to use good hand hygiene technique     Problem: Pain  Goal: Pain at adequate level as identified by  patient  09/22/2018 0244 by Deniece Ree, RN  Outcome: Progressing  Flowsheets (Taken 09/21/2018 1650 by Levan Hurst, RN)  Pain at adequate level as identified by patient: Identify patient comfort function goal;Assess for risk of opioid induced respiratory depression, including snoring/sleep apnea. Alert healthcare team of risk factors identified.;Assess pain on admission, during daily assessment and/or before any "as needed" intervention(s);Reassess pain within 30-60 minutes of any procedure/intervention, per Pain Assessment, Intervention, Reassessment (AIR) Cycle;Evaluate if patient comfort function goal is met;Evaluate patient's satisfaction with pain management progress;Offer non-pharmacological pain management interventions;Consult/collaborate with Pain Service;Consult/collaborate with Physical Therapy, Occupational Therapy, and/or Speech Therapy;Include patient/patient care companion in decisions related to pain management as needed  09/22/2018 0241 by Deniece Ree, RN  Outcome: Progressing  Flowsheets (Taken 09/21/2018 1650 by Levan Hurst, RN)  Pain at adequate level as identified by patient: Identify patient comfort function goal;Assess for risk of opioid induced respiratory depression, including snoring/sleep apnea. Alert healthcare team of risk factors identified.;Assess pain on admission, during daily assessment and/or before any "as needed" intervention(s);Reassess pain within 30-60 minutes of any procedure/intervention, per Pain Assessment, Intervention, Reassessment (AIR) Cycle;Evaluate if patient comfort function goal is met;Evaluate patient's satisfaction with pain management progress;Offer non-pharmacological pain management interventions;Consult/collaborate with Pain Service;Consult/collaborate with Physical Therapy, Occupational Therapy, and/or Speech Therapy;Include patient/patient care companion in decisions related to pain management as needed     Problem: Side Effects  from Pain Analgesia  Goal: Patient will experience minimal side effects of analgesic therapy  09/22/2018 0244 by Deniece Ree, RN  Outcome: Progressing  Flowsheets (Taken 09/21/2018 1650 by Levan Hurst, RN)  Patient will experience minimal side effects of analgesic therapy: Assess for changes in cognitive function;Prevent/manage side effects per LIP orders (i.e. nausea, vomiting, pruritus, constipation, urinary retention, etc.);Evaluate for opioid-induced sedation with appropriate  assessment tool (i.e. POSS);Monitor/assess patient's respiratory status (RR depth, effort, breath sounds)  09/22/2018 0241 by Deniece Ree, RN  Outcome: Progressing  Flowsheets (Taken 09/21/2018 1650 by Levan Hurst, RN)  Patient will experience minimal side effects of analgesic therapy: Assess for changes in cognitive function;Prevent/manage side effects per LIP orders (i.e. nausea, vomiting, pruritus, constipation, urinary retention, etc.);Evaluate for opioid-induced sedation with appropriate assessment tool (i.e. POSS);Monitor/assess patient's respiratory status (RR depth, effort, breath sounds)     Problem: Discharge Barriers  Goal: Patient will be discharged home or other facility with appropriate resources  09/22/2018 0244 by Deniece Ree, RN  Outcome: Progressing  09/22/2018 0241 by Deniece Ree, RN  Outcome: Progressing     Problem: Psychosocial and Spiritual Needs  Goal: Demonstrates ability to cope with hospitalization/illness  09/22/2018 0244 by Deniece Ree, RN  Outcome: Progressing  Flowsheets (Taken 09/22/2018 0241)  Demonstrates ability to cope with hospitalizations/illness: Encourage verbalization of feelings/concerns/expectations;Provide quiet environment;Assist patient to identify own strengths and abilities;Encourage patient to set small goals for self  09/22/2018 0241 by Deniece Ree, RN  Outcome: Progressing  Flowsheets (Taken 09/22/2018 0241)  Demonstrates ability to  cope with hospitalizations/illness: Encourage verbalization of feelings/concerns/expectations;Provide quiet environment;Assist patient to identify own strengths and abilities;Encourage patient to set small goals for self     Problem: Moderate/High Fall Risk Score >5  Goal: Patient will remain free of falls  09/22/2018 0244 by Deniece Ree, RN  Outcome: Progressing  Flowsheets (Taken 09/21/2018 2150)  Moderate Risk (6-13): MOD-Consider activation of bed alarm if appropriate;MOD-Apply bed exit alarm if patient is confused;MOD-Floor mat at bedside (where available) if appropriate;MOD-Remain with patient during toileting;MOD-Utilize diversion activities;MOD-Perform dangle, stand, walk (DSW) when getting patient up;MOD-Use gait belt when appropriate  09/22/2018 0241 by Deniece Ree, RN  Outcome: Progressing  Flowsheets (Taken 09/21/2018 2150)  Moderate Risk (6-13): MOD-Consider activation of bed alarm if appropriate;MOD-Apply bed exit alarm if patient is confused;MOD-Floor mat at bedside (where available) if appropriate;MOD-Remain with patient during toileting;MOD-Utilize diversion activities;MOD-Perform dangle, stand, walk (DSW) when getting patient up;MOD-Use gait belt when appropriate     Problem: Diabetes: Glucose Imbalance  Goal: Blood glucose stable at established goal  Outcome: Progressing  Flowsheets (Taken 09/22/2018 0244)  Blood glucose stable at established goal : Monitor lab values; Monitor intake and output.  Notify LIP if urine output is < 30 mL/hour.; Monitor/assess vital signs; Assess for hypoglycemia /hyperglycemia   Patient is alert and oriented x 4. Patient is pretty much independent on ADL's.  However, patient insisted to go outside to smoke. RN and charge RN explained the policy and the rationale why it was not allowed to smoke in the premises. Patient was upset. Patient refused the nicotine patch that the MD ordered. Patient is stable medically wise. Pain management is ongoing. Patient's  BS was 154 at night. Patient is only covered with 50units of lantus and gave midnight snacks. Will continue to assess and address patient's needs.

## 2018-09-22 NOTE — Consults (Signed)
PALLIATIVE CONSULT    Date Time: 09/22/18 1:38 PM  Patient Name: [name]  Referring physician:  Christophe Louis, DO   Date of Admission: 09/21/2018  Referral Date: 3/136/2020  Consult Date: 09/22/2018  Consulting Provider: Roanna Raider, MD     Assessment:   Presented for C-spine surgery needing blood sugar control. Admitted for chronic neck pain pending surgery for cervical spine compression.   PMP Nucynta ER 150 mg po BID and nucynta 50 mg po BID-TID, MME  120-180 mg daily based upon patient report she cannot take the short acting but she did pick it up from the pharmacy.   Pain scores 0-8/10 on oxycodone 5-325 mg po q 4 hr prn and tramadol 50 mg q 6 hr prn.  Vital signs stable, WBC 8.64, CrCl 94.2, CXR nl.    Patient not in her room. Reviewed with [name], PharmD, and patient has reaction to short acting.   She has done well with morphine in the past.       Neck pain   Opioid tolerance   BUE Numbness   LUE weakness   Tobacco use disorder   Opioid Induced Constipation    Advance Care Documents in Epic: None on file.     Plan:     Neck pain   -- acute, severe, managed  -- add Nucynta ER 150 mg po BID  -- continue hydrocodone-apap 5-325 mg po q 4 hr prn pain  -- monitor pain  -- morphine 2 mg IV q 4 hr prn pain and not able to take PO.   Numbness  -- continue Cymbalta 20 mg po BID    Tobacco use disorder  -- chronic, unmanaged  -- patient accepts cessation tomorrow of all nicotine to promote bone healing     Opioid Induced Constipation  -- Add Senna 8.6 mg po daily prn  -- Add Dulcolax 10 mg pr q day if no BM in the previous 48 hours.     Discussed with [name] Burton, RN and [name], PharmD.    History and Physical:     Reason for Consult (Chief Complaint): Pain  Primary Diagnosis: cervical spine compression    History of Presenting Illness:    This is a 48 y.o. female admitted with Cervical spinal cord compression [G95.20]  Type 2 diabetes mellitus with hyperglycemia, with long-term current use of  insulin [E11.65, Z79.4] with underlying DDD. Timing: ongoing pain. Location: from neck to arm. Quality: pain with numbenss. Severity: moderate. Worsened by: activity.  Improved by: norco 5-325 q 4 hr prn. Pertinent positives: reports she cannot take nucynta short acting but can take the ER, poorly controlled DM. Pertinent negatives: no fever.   She is a smoker.  Nursing notes she is out of the room to meet family. .    Past Medical History:     Past Medical History:   Diagnosis Date    CHF (congestive heart failure)     diastolic, per pt never had acute episode of CHF    Complication of anesthesia     almost chewed through her tongue and severe chills in past    Coronary artery disease     mild non-obstructive ASCAD on heart cath report 12/2015    Depression     Diabetes mellitus     Fibromyalgia     Hidradenitis     Hydradenitis     Hyperlipidemia     Hypertension     Malignant neoplasm of skin  basal/ squamos    Neuropathy     Sleep apnea     Type 2 diabetes mellitus, controlled        Past Surgical History:     Past Surgical History:   Procedure Laterality Date    BREAST SURGERY  1998    reduction x2    CARDIAC SURGERY  12/2015    L heart cath, diagnosis mild non-obstrutive ASCAD    CESAREAN SECTION  2008    with tummy tuck    COSMETIC SURGERY  1990s,2000s    thigh 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    HAND SURGERY Right     x3, cyst    INCISION AND DRAINAGE OF WOUND      multiple I&D due to hidradenitis    TONSILLECTOMY         Family History:     Family History   Problem Relation Age of Onset    Hyperlipidemia Mother     Alcohol abuse Father     Hyperlipidemia Father        Social History:   Tobacco and Alcohol use:  reports that she quit smoking yesterday. She has a 45.00 pack-year smoking history. She has never used smokeless tobacco. She reports that she does not drink alcohol or use drugs.         Allergies:     Allergies   Allergen Reactions    Wellbutrin [Bupropion] Other  (See Comments)     tachycardia    Enablex [Darifenacin] Rash    Gabapentin Rash    Lyrica [Pregabalin] Rash    Nucynta [Tapentadol] Rash     Instant release only; does well with extended release    Vesicare [Solifenacin] Rash       Medications:     Current Facility-Administered Medications   Medication Dose Route Frequency    DULoxetine  20 mg Oral BID    enoxaparin  40 mg Subcutaneous Daily    hydroCHLOROthiazide  25 mg Oral QHS    insulin glargine  20 Units Subcutaneous QAM    insulin glargine  50 Units Subcutaneous QHS    insulin lispro  1-6 Units Subcutaneous QHS    insulin lispro  2-10 Units Subcutaneous TID AC    isosorbide mononitrate  30 mg Oral QHS    metFORMIN  1,000 mg Oral BID Meals    nicotine  1 patch Transdermal Daily    simvastatin  40 mg Oral QHS    vitamin D (cholecalciferol)  25 mcg Oral Daily     As needed medications include: Nursing communication: Adult Hypoglycemia Treatment Algorithm **AND** dextrose **AND** dextrose **AND** glucagon (rDNA), HYDROcodone-acetaminophen, hydrOXYzine, naloxone, ondansetron **OR** ondansetron, zolpidem   Reviewed in Epic: MAR    Review of Systems:   Review of Systems   Constitutional: Positive for activity change. Negative for appetite change.   HENT: Negative for hearing loss and trouble swallowing.    Eyes: Negative for pain and redness.   Respiratory: Negative for choking and shortness of breath.    Cardiovascular: Negative for chest pain and leg swelling.   Gastrointestinal: Positive for constipation. Negative for abdominal pain.   Endocrine: Negative for polydipsia and polyphagia.   Genitourinary: Negative for difficulty urinating and dysuria.   Musculoskeletal: Positive for back pain and neck pain.   Skin: Positive for rash. Negative for wound.   Neurological: Positive for numbness. Negative for speech difficulty.   Hematological: Negative for adenopathy. Does not bruise/bleed easily.   Psychiatric/Behavioral: Negative  for confusion and  decreased concentration.           [place] Symptom Assessment System (ESAS)     1.  Pain  Severity:  Pain Score: 8-severe pain (09/22/18 1103)                       Pain Character:  aching                       Pain Duration:  Long term                        Pain Effect:   Limits grip and use                       Pain Factors:  Left worse then righ                       Pain Frequency:  Pain Frequency: Intermittent (09/22/18 1103)                       Pain Location:    Pain Location: Generalized (09/22/18 1103)    2.    Constipation - Bowel regimen  +           3.    Dyspnea      -  4.    Nausea:    -        5.    Anorexia:  -        6.    Fatigue: -        7.    Insomnia: -     Physical Exam:     VS: BP 142/85    Pulse (!) 117    Temp 97.6 F (36.4 C) (Oral)    Resp 16    Ht 1.575 m (5\' 2" )    Wt 98.4 kg (217 lb)    SpO2 96%    BMI 39.69 kg/m   GENERAL: Appears in no distress.  CONSTITUTIONAL: Nutrition: overweight. Development: normal.  EYES:  Sclera anicteric. Conjunctivae pink.   HENT: Oral mucosa: moist. Tongue:  pink.  NECK: Trachea midline.  No adenopathy.  RESPIRATORY: Effort: normal. Ascultation: clear.  Cyanosis: no.  CARDIAC: RRR. Normal S1, S2. No murmur appreciated.  Edema: no.    ABDOMEN: Appearance: norma. Masses: no. Tenderness: no. BS: +.  Rectal: deferred.  GU: deferred.     MUSCULOSKELETAL:  Gait: in bed.  Posturing Joints: normal.  BUE and BLE no tenderness to palpation.   LYMPHATIC: no cervical or axillary nodes.  SKIN: erythema above eyes, under eyes and corners of mouth.  NEURO: Myoclonus: none.  Sensation: decreased fingers.  Weakness: right grip a little weak.  PSYCH:  Arousal: calm.  Affect: full.  Judgement and insight: intact.  Speech: normal. Content: normal. Orientation: not tested.    Labs/Diagnostics:     Recent Labs   Lab 09/22/18  1009 09/21/18  1047 09/18/18  0820   WBC 8.64 9.52* 11.40*   Hgb 14.9* 15.0* 15.6*   Hematocrit 44.2* 43.9* 45.7*   Platelets 260 265 315   Sodium  137 135* 131*   Potassium 3.8 3.7 4.3   Chloride 97* 96* 92*   CO2 29 27 27    BUN 13.0 16.0 14.0   Creatinine 0.8 1.0 1.0   EGFR >60.0 59.1 59.1   Glucose 209*  241* 302*   AST (SGOT)  --  19  --    ALT  --  23  --    Alkaline Phosphatase  --  73  --    Albumin  --  3.8  --    Bilirubin, Total  --  1.0  --        Wt Readings from Last 10 Encounters:   09/22/18 98.4 kg (217 lb)   09/12/18 93 kg (205 lb)   09/05/18 93 kg (205 lb)   08/01/18 93 kg (205 lb)   06/09/18 94.3 kg (208 lb)         Radiology Results (24 Hour)     Procedure Component Value Units Date/Time    XR Chest 2 Views [[id number]] Collected:  09/22/18 1132    Order Status:  Completed Updated:  09/22/18 1137    Narrative:       History: pre-op    Technique: PA and Lateral    Comparison: 08/07/18    Findings:  The lungs appear clear.  There is no pneumothorax.  The heart is normal in size.    The mediastinum is within normal limits.             Impression:        No active disease is seen in the chest.    Laurena Slimmer, MD   09/22/2018 11:32 AM           Signed by: [name] Raider, MD    PALLIATIVE PERSPECTIVES  [phone]

## 2018-09-22 NOTE — Progress Notes (Signed)
NEUROSURGERY PROGRESS NOTE    Date Time: 09/22/18 9:28 AM  Patient Name: [name]  Consulting Attending Physician: Dr. Jonathon Jordan  Covered By: Mackie Pai Neurosurgery PA [phone]/ pager [phone] (Mon-Fri 0700-1500)    Interim History:   Pt c/o pain to neck, radiating down arm bil with decreases sensation to LUE and toes of bil feet. Pain is rated 7/10 at this time. Pt reports that she takes nucynta for home at home but this isn't available in hospital. Pt frustrated, reports sister is caring for pt's 29 year old twins, one of whom is suicidal.    Medications:     Current Facility-Administered Medications   Medication Dose Route Frequency    DULoxetine  20 mg Oral BID    enoxaparin  40 mg Subcutaneous Daily    hydroCHLOROthiazide  25 mg Oral QHS    insulin glargine  20 Units Subcutaneous QAM    insulin glargine  50 Units Subcutaneous QHS    insulin lispro  1-6 Units Subcutaneous QHS    insulin lispro  2-10 Units Subcutaneous TID AC    isosorbide mononitrate  30 mg Oral QHS    metFORMIN  1,000 mg Oral BID Meals    nicotine  1 patch Transdermal Daily    simvastatin  40 mg Oral QHS    traMADol  50 mg Oral Q8H    vitamin D (cholecalciferol)  25 mcg Oral Daily         Physical Exam:     Vitals:    09/22/18 0731   BP: 118/77   Pulse: 82   Resp: 16   Temp: 97.1 F (36.2 C)   SpO2: 96%       Intake and Output Summary (Last 24 hours) at Date Time    Intake/Output Summary (Last 24 hours) at 09/22/2018 6045  Last data filed at 09/22/2018 0600  Gross per 24 hour   Intake 1290 ml   Output 0 ml   Net 1290 ml       Neuro exam:  Awake, alert  Frustrated affected  Speech clear and fluent.   PERRL 4mm. EOMI FS  Motor:   BUE 4+/5 biceps, triceps. 5/5 deltoids, grip, IO  BLE 5/5 IP, quads, DF, PF, EHL  +hoffmans on R  +2 beats clonus bilat  Diminished sensation to light touch over LUE and toes of bil feet.    Labs:     Lab Results   Component Value Date    WBC 9.52 (H) 09/21/2018    HGB 15.0 (H) 09/21/2018    HCT 43.9  (H) 09/21/2018    MCV 91.3 09/21/2018    PLT 265 09/21/2018     Lab Results   Component Value Date    NA 135 (L) 09/21/2018    K 3.7 09/21/2018    CL 96 (L) 09/21/2018    CO2 27 09/21/2018     Lab Results   Component Value Date    INR 0.9 09/18/2018    INR 1.0 08/07/2018    PT 12.2 (L) 09/18/2018    PT 12.9 08/07/2018       Rads:     Radiology Results (24 Hour)     ** No results found for the last 24 hours. **          Assessment:   47 yo female with history of diastolic HF, IDDM, HTN, hyperlipidemia, OSA, narcolepsy, depression, hydradenitis with abscess, fibromyalgia, smoker, now with cervical disc herniation, planning for OR tomorrow.  Plan:   Medical management per primary care team   Optimize medically for surgery  Need Anesthesia clearance for surgery  No bloodthinners / ASA / NSAIDs   NPO at midnight  Pre-op labs (ordered)  Consent in chart    Signed by: Sarita Haver PA-C  Date/Time: 09/22/18 9:28 AM         NEUROSURGERY ATTENDING ADDENDUM    I met, interviewed and examined the patient today. I also reviewed the patient's medications, vitals, lab results and imaging studies myself. I agree with the above note.    48 y.o. female presenting with symptoms of myelopathy, as well as bilateral arm pain and burning and numbness.  Her symptoms have been worsening over the past few months, and she has been having more falls lately.  On examination, she is myelopathic with evidence of weakness in her left hand.  Her imaging is consistent with large disc herniations at C5-C6 and C6-C7, resulting in spinal cord and bilateral nerve root compression.    The patient is a smoker and diabetic, and refuses to quit smoking or to have her diabetes well controlled.  We will cancel her surgery once before because of this compliance issues.  She eventually agreed to present to the hospital 3 days earlier in order to get treated for her hyperglycemia.    I had an extensive discussion again today with the patient regarding her  condition and her options.  I explained to her that she is at a very high risk for postoperative infection because of her uncontrolled diabetes.  Also explained to her that she has a very high risk for hardware and instrumentation failure, or failure of the fusion, because of her excessive smoking but she refused to stop.  Because of the patient's severe myelopathy that has been worsening lately, doing the surgery remains an option, as long as the patient understands and agrees with this risks.  I also mentioned the patient the other risks of surgery include but are not limited to death, bleeding, spinal cord injury, nerve injury, paralysis, weakness, numbness, CSF leak, pseudomeningocele formation, heart or lung complications from being under anesthesia, failure of surgery to improve her symptoms, or needing surgery in the future.the patient voiced complete understanding of the risks associated with the surgery and elected to proceed with surgical intervention with C5-6 and C6-C7 ACDF.  We will be proceeding with surgery in the morning.  All questions were answered.      Hoyle Sauer. Jizel Cheeks, MD  Minimally Invasive and Complex Spine Surgery & General Neurosurgery  Department of Neurosciences   [facility]  Assistant Professor of Neurosurgery   [facility] Address: [address], [address]   [place], [place] [address]  Office Phone [phone]   Appointments [phone]   Fax [phone]

## 2018-09-22 NOTE — Progress Notes (Signed)
Palliative Medicine & Comprehensive Care Consult  Clinical Therapist - Initial Assessment    Date of Admission: 09/21/2018  Room/Bed: Z6109/U0454.B    Situation: Pt sitting up in bed awake, alert and oriented with nursing at bedside.     Background: [name] is a 48 y.o. female admitted with Diabetes mellitus, insulin dependent (IDDM), uncontrolled.    Assessment: Provided introduction of Palliative Clinical Therapist and explanation of role. Pt reported being in a lot of pain and not being able to receive the medication she needs to support her and having to rely on family members to bring the medication in to the hospital. Pt shared her frustrations with that as well as reporting being hopeful that she can get surgery tomorrow on to help manage her pain. Pt shared that she has family in the area and that she has 2 twin dtr, age 90, at home. Palliative Clinical Therapist left card and reported that he is available for emotional support as needed.     Psychosocial Factors:  Grief/bereavement: Not identified   Depression/demoralization: Not identified   Anxiety: Pt appeared to be anxious re: the current pain she was in and in re: to whether or not she will receive surgery tomorrow     Spiritual/Socio-Cultural Factors:  Spirituality/religion: None  Is this a resource?: Not assessed   Cultural/value systems: Not assessed     Advance Care Planning:  Code status: Full Code   Advance Directive: Patient does not have advance directive   Health care decision maker: Self     Interventions:  Rapport Building   Assessment     Recommendations/Plan:   Palliative Team will continue to follow patient.    Orene Desanctis MSW, LCSW   Clinical Therapist  Palliative Medicine & Comprehensive Care   Main:[phone]   Spectra: [phone]

## 2018-09-23 ENCOUNTER — Inpatient Hospital Stay: Payer: BC Managed Care – PPO | Admitting: Anesthesiology

## 2018-09-23 ENCOUNTER — Encounter
Admission: EM | Disposition: A | Discharge status: Home / Self Care | Payer: Self-pay | Source: Home / Self Care | Attending: Internal Medicine

## 2018-09-23 ENCOUNTER — Inpatient Hospital Stay: Payer: BC Managed Care – PPO

## 2018-09-23 ENCOUNTER — Ambulatory Visit
Admission: RE | Admit: 2018-09-23 | Payer: BC Managed Care – PPO | Source: Ambulatory Visit | Admitting: Neurological Surgery

## 2018-09-23 DIAGNOSIS — G4733 Obstructive sleep apnea (adult) (pediatric): Secondary | ICD-10-CM

## 2018-09-23 DIAGNOSIS — Z981 Arthrodesis status: Secondary | ICD-10-CM

## 2018-09-23 DIAGNOSIS — E782 Mixed hyperlipidemia: Secondary | ICD-10-CM

## 2018-09-23 DIAGNOSIS — I509 Heart failure, unspecified: Secondary | ICD-10-CM

## 2018-09-23 DIAGNOSIS — M4802 Spinal stenosis, cervical region: Secondary | ICD-10-CM

## 2018-09-23 DIAGNOSIS — M50222 Other cervical disc displacement at C5-C6 level: Secondary | ICD-10-CM

## 2018-09-23 DIAGNOSIS — M50223 Other cervical disc displacement at C6-C7 level: Secondary | ICD-10-CM

## 2018-09-23 LAB — GFR: EGFR: >60

## 2018-09-23 LAB — CBC
Absolute NRBC: 0 10*3/uL (ref 0.00–0.00)
Hematocrit: 43.1 % (ref 34.7–43.7)
Hgb: 14.2 g/dL (ref 11.4–14.8)
MCH: 30.5 pg (ref 25.1–33.5)
MCHC: 32.9 g/dL (ref 31.5–35.8)
MCV: 92.5 fL (ref 78.0–96.0)
MPV: 10.1 fL (ref 8.9–12.5)
Nucleated RBC: 0 /100 WBC (ref 0.0–0.0)
Platelets: 242 10*3/uL (ref 142–346)
RBC: 4.66 10*6/uL (ref 3.90–5.10)
RDW: 12 % (ref 11–15)
WBC: 8.58 10*3/uL (ref 3.10–9.50)

## 2018-09-23 LAB — COMPREHENSIVE METABOLIC PANEL
ALT: 25 U/L (ref 0–55)
AST (SGOT): 20 U/L (ref 5–34)
Albumin/Globulin Ratio: 1.4 (ref 0.9–2.2)
Albumin: 3.7 g/dL (ref 3.5–5.0)
Alkaline Phosphatase: 61 U/L (ref 37–106)
Anion Gap: 11 (ref 5.0–15.0)
BUN: 11 mg/dL (ref 7.0–19.0)
Bilirubin, Total: 0.6 mg/dL (ref 0.2–1.2)
CO2: 25 mEq/L (ref 22–29)
Calcium: 9.7 mg/dL (ref 8.5–10.5)
Chloride: 101 mEq/L (ref 100–111)
Creatinine: 0.8 mg/dL (ref 0.6–1.0)
Globulin: 2.7 g/dL (ref 2.0–3.6)
Glucose: 200 mg/dL — ABNORMAL HIGH (ref 70–100)
Potassium: 3.5 mEq/L (ref 3.5–5.1)
Protein, Total: 6.4 g/dL (ref 6.0–8.3)
Sodium: 137 mEq/L (ref 136–145)

## 2018-09-23 LAB — GLUCOSE WHOLE BLOOD - POCT
Whole Blood Glucose POCT: 177 mg/dL — ABNORMAL HIGH (ref 70–100)
Whole Blood Glucose POCT: 203 mg/dL — ABNORMAL HIGH (ref 70–100)
Whole Blood Glucose POCT: 208 mg/dL — ABNORMAL HIGH (ref 70–100)
Whole Blood Glucose POCT: 189 mg/dL — ABNORMAL HIGH (ref 70–100)
Whole Blood Glucose POCT: 253 mg/dL — ABNORMAL HIGH (ref 70–100)

## 2018-09-23 LAB — PT AND APTT
PT INR: 0.9 (ref 0.9–1.1)
PT: 12.3 s — ABNORMAL LOW (ref 12.6–15.0)
PTT: 27 s (ref 23–37)

## 2018-09-23 LAB — HEMOLYSIS INDEX: Hemolysis Index: 5 (ref 0–18)

## 2018-09-23 SURGERY — DISCECTOMY, FUSION, CERVICAL, ANTERIOR, LEVELS 2
Anesthesia: Anesthesia General | Site: Spine Cervical | Wound class: Clean

## 2018-09-23 MED ORDER — MORPHINE SULFATE 4 MG/ML IJ/IV SOLN (WRAP)
4.0000 mg | Freq: Once | Status: AC | PRN
Start: 2018-09-23 — End: 2018-09-24
  Administered 2018-09-24: 4 mg via INTRAVENOUS
  Filled 2018-09-23: qty 1

## 2018-09-23 MED ORDER — PROPOFOL 10 MG/ML IV EMUL (WRAP)
INTRAVENOUS | Status: AC
Start: 2018-09-23 — End: ?
  Filled 2018-09-23: qty 50

## 2018-09-23 MED ORDER — HYDROMORPHONE HCL 1 MG/ML IJ SOLN
INTRAMUSCULAR | Status: AC
Start: 2018-09-23 — End: ?
  Filled 2018-09-23: qty 1

## 2018-09-23 MED ORDER — POLYETHYLENE GLYCOL 3350 17 G PO PACK
17.0000 g | PACK | Freq: Every day | ORAL | Status: DC | PRN
Start: 2018-09-23 — End: 2018-09-24

## 2018-09-23 MED ORDER — DEXAMETHASONE SOD PHOSPHATE PF 10 MG/ML IJ SOLN
INTRAMUSCULAR | Status: DC | PRN
Start: 2018-09-23 — End: 2018-09-23
  Administered 2018-09-23: 10 mg via INTRAVENOUS

## 2018-09-23 MED ORDER — MIDAZOLAM HCL 1 MG/ML IJ SOLN (WRAP)
INTRAMUSCULAR | Status: DC | PRN
Start: 2018-09-23 — End: 2018-09-23
  Administered 2018-09-23: 2 mg via INTRAVENOUS

## 2018-09-23 MED ORDER — SODIUM CHLORIDE 0.9 % IV SOLN
INTRAVENOUS | Status: DC
Start: 2018-09-23 — End: 2018-09-24

## 2018-09-23 MED ORDER — REMIFENTANIL HCL 1 MG IV SOLR
INTRAVENOUS | Status: DC | PRN
Start: 2018-09-23 — End: 2018-09-23
  Administered 2018-09-23: .2 ug/kg/min via INTRAVENOUS

## 2018-09-23 MED ORDER — FENTANYL CITRATE (PF) 50 MCG/ML IJ SOLN (WRAP)
INTRAMUSCULAR | Status: AC
Start: 2018-09-23 — End: ?
  Filled 2018-09-23: qty 2

## 2018-09-23 MED ORDER — FENTANYL CITRATE (PF) 50 MCG/ML IJ SOLN (WRAP)
25.0000 ug | INTRAMUSCULAR | Status: AC | PRN
Start: 2018-09-23 — End: 2018-09-23
  Administered 2018-09-23 (×3): 25 ug via INTRAVENOUS

## 2018-09-23 MED ORDER — PROPOFOL INFUSION 10 MG/ML
INTRAVENOUS | Status: DC | PRN
Start: 2018-09-23 — End: 2018-09-23
  Administered 2018-09-23: 150 ug/kg/min via INTRAVENOUS

## 2018-09-23 MED ORDER — SODIUM CHLORIDE 0.9 % IV MBP
2.00 g | INTRAVENOUS | Status: DC
Start: 2018-09-24 — End: 2018-09-24
  Administered 2018-09-24: 10:00:00 2 g via INTRAVENOUS
  Filled 2018-09-23: qty 2000

## 2018-09-23 MED ORDER — ROCURONIUM BROMIDE 50 MG/5ML IV SOLN
INTRAVENOUS | Status: AC
Start: 2018-09-23 — End: ?
  Filled 2018-09-23: qty 5

## 2018-09-23 MED ORDER — INSULIN LISPRO 100 UNIT/ML SC SOLN
2.0000 [IU] | Freq: Once | SUBCUTANEOUS | Status: AC
Start: 2018-09-23 — End: 2018-09-23
  Administered 2018-09-23: 16:00:00 2 [IU] via SUBCUTANEOUS

## 2018-09-23 MED ORDER — ONDANSETRON HCL 4 MG/2ML IJ SOLN
INTRAMUSCULAR | Status: AC
Start: 2018-09-23 — End: ?
  Filled 2018-09-23: qty 2

## 2018-09-23 MED ORDER — FENTANYL CITRATE (PF) 50 MCG/ML IJ SOLN (WRAP)
INTRAMUSCULAR | Status: AC
Start: 2018-09-23 — End: 2018-09-23
  Administered 2018-09-23: 14:00:00 25 ug via INTRAVENOUS
  Filled 2018-09-23: qty 2

## 2018-09-23 MED ORDER — PHENYLEPHRINE HCL 10 MG/ML IV SOLN (WRAP)
Status: AC
Start: 2018-09-23 — End: ?
  Filled 2018-09-23: qty 2

## 2018-09-23 MED ORDER — PHENYLEPHRINE HCL-NACL 100-0.9 MG/250ML-% IV SOLN
INTRAVENOUS | Status: AC
Start: 2018-09-23 — End: ?
  Filled 2018-09-23: qty 250

## 2018-09-23 MED ORDER — OXYCODONE-ACETAMINOPHEN 5-325 MG PO TABS
ORAL_TABLET | ORAL | Status: AC
Start: 2018-09-23 — End: 2018-09-23
  Administered 2018-09-23: 14:00:00 1 via ORAL
  Filled 2018-09-23: qty 1

## 2018-09-23 MED ORDER — LIDOCAINE-EPINEPHRINE 1 %-1:100000 IJ SOLN
INTRAMUSCULAR | Status: DC | PRN
Start: 2018-09-23 — End: 2018-09-23
  Administered 2018-09-23: 7 mL

## 2018-09-23 MED ORDER — LACTATED RINGERS IV SOLN
INTRAVENOUS | Status: DC | PRN
Start: 2018-09-23 — End: 2018-09-23

## 2018-09-23 MED ORDER — INSULIN GLARGINE 100 UNIT/ML SC SOLN
20.00 [IU] | Freq: Once | SUBCUTANEOUS | Status: AC
Start: 2018-09-23 — End: 2018-09-23
  Administered 2018-09-23: 18:00:00 20 [IU] via SUBCUTANEOUS
  Filled 2018-09-23 (×2): qty 20

## 2018-09-23 MED ORDER — LIDOCAINE HCL (PF) 2 % IJ SOLN
INTRAMUSCULAR | Status: AC
Start: 2018-09-23 — End: ?
  Filled 2018-09-23: qty 5

## 2018-09-23 MED ORDER — VANCOMYCIN 1000 MG IN 250 ML NS IVPB VIAL-MATE (CNR)
1000.0000 mg | Freq: Two times a day (BID) | INTRAVENOUS | Status: AC
Start: 2018-09-23 — End: 2018-09-24
  Administered 2018-09-23 – 2018-09-24 (×2): 1000 mg via INTRAVENOUS
  Filled 2018-09-23 (×2): qty 250

## 2018-09-23 MED ORDER — MIDAZOLAM HCL 1 MG/ML IJ SOLN (WRAP)
INTRAMUSCULAR | Status: AC
Start: 2018-09-23 — End: ?
  Filled 2018-09-23: qty 2

## 2018-09-23 MED ORDER — ONDANSETRON HCL 4 MG/2ML IJ SOLN
INTRAMUSCULAR | Status: DC | PRN
Start: 2018-09-23 — End: 2018-09-23
  Administered 2018-09-23: 4 mg via INTRAVENOUS

## 2018-09-23 MED ORDER — PHENYLEPHRINE HCL 10 MG/ML IV SOLN (WRAP)
Status: DC | PRN
Start: 2018-09-23 — End: 2018-09-23
  Administered 2018-09-23 (×2): 40 ug via INTRAVENOUS

## 2018-09-23 MED ORDER — THROMBIN 5000 UNITS EX SOLR
CUTANEOUS | Status: DC | PRN
Start: 2018-09-23 — End: 2018-09-23
  Administered 2018-09-23: 5000 [IU] via TOPICAL

## 2018-09-23 MED ORDER — FENTANYL CITRATE (PF) 50 MCG/ML IJ SOLN (WRAP)
INTRAMUSCULAR | Status: DC | PRN
Start: 2018-09-23 — End: 2018-09-23
  Administered 2018-09-23: 100 ug via INTRAVENOUS
  Administered 2018-09-23: 50 ug via INTRAVENOUS

## 2018-09-23 MED ORDER — EPHEDRINE SULFATE 50 MG/ML IJ/IV SOLN (WRAP)
Status: AC
Start: 2018-09-23 — End: ?
  Filled 2018-09-23: qty 1

## 2018-09-23 MED ORDER — VANCOMYCIN HCL 1 G IV SOLR
INTRAVENOUS | Status: DC | PRN
Start: 2018-09-23 — End: 2018-09-23
  Administered 2018-09-23: 2 g

## 2018-09-23 MED ORDER — PHENYLEPHRINE HCL 10 MG/ML IV SOLN (WRAP)
Status: DC | PRN
Start: 2018-09-23 — End: 2018-09-23
  Administered 2018-09-23: 10:00:00 25 ug/min via INTRAVENOUS

## 2018-09-23 MED ORDER — LACTATED RINGERS IV SOLN
INTRAVENOUS | Status: DC | PRN
Start: 2018-09-23 — End: 2018-09-24

## 2018-09-23 MED ORDER — SUCCINYLCHOLINE CHLORIDE 20 MG/ML IJ SOLN
INTRAMUSCULAR | Status: AC
Start: 2018-09-23 — End: ?
  Filled 2018-09-23: qty 10

## 2018-09-23 MED ORDER — HYDROMORPHONE HCL 1 MG/ML IJ SOLN
INTRAMUSCULAR | Status: AC
Start: 2018-09-23 — End: 2018-09-23
  Administered 2018-09-23: 14:00:00 0.5 mg via INTRAVENOUS
  Filled 2018-09-23: qty 2

## 2018-09-23 MED ORDER — ACETAMINOPHEN 325 MG PO TABS
650.0000 mg | ORAL_TABLET | ORAL | Status: DC | PRN
Start: 2018-09-23 — End: 2018-09-24
  Administered 2018-09-24: 650 mg via ORAL
  Filled 2018-09-23: qty 2

## 2018-09-23 MED ORDER — SUCCINYLCHOLINE CHLORIDE 20 MG/ML IJ SOLN
INTRAMUSCULAR | Status: DC | PRN
Start: 2018-09-23 — End: 2018-09-23
  Administered 2018-09-23: 120 mg via INTRAVENOUS

## 2018-09-23 MED ORDER — ONDANSETRON HCL 4 MG/2ML IJ SOLN
4.0000 mg | INTRAMUSCULAR | Status: DC | PRN
Start: 2018-09-23 — End: 2018-09-24

## 2018-09-23 MED ORDER — CEFTRIAXONE SODIUM 2 G IJ SOLR
2.00 g | Freq: Once | INTRAMUSCULAR | Status: AC
Start: 2018-09-23 — End: 2018-09-23
  Administered 2018-09-23: 09:00:00 2 g via INTRAVENOUS

## 2018-09-23 MED ORDER — PROPOFOL 10 MG/ML IV EMUL (WRAP)
INTRAVENOUS | Status: DC | PRN
Start: 2018-09-23 — End: 2018-09-23
  Administered 2018-09-23: 200 mg via INTRAVENOUS

## 2018-09-23 MED ORDER — ONDANSETRON HCL 4 MG/2ML IJ SOLN
4.0000 mg | Freq: Once | INTRAMUSCULAR | Status: DC | PRN
Start: 2018-09-23 — End: 2018-09-23

## 2018-09-23 MED ORDER — OXYCODONE-ACETAMINOPHEN 5-325 MG PO TABS
1.0000 | ORAL_TABLET | ORAL | Status: DC | PRN
Start: 2018-09-23 — End: 2018-09-24
  Administered 2018-09-23 – 2018-09-24 (×3): 1 via ORAL
  Filled 2018-09-23 (×3): qty 1

## 2018-09-23 MED ORDER — INSULIN LISPRO 100 UNIT/ML SC SOLN
1.0000 [IU] | Freq: Once | SUBCUTANEOUS | Status: AC
Start: 2018-09-23 — End: 2018-09-23
  Administered 2018-09-23: 13:00:00 1 [IU] via SUBCUTANEOUS

## 2018-09-23 MED ORDER — INSULIN LISPRO 100 UNIT/ML SC SOLN
SUBCUTANEOUS | Status: AC
Start: 2018-09-23 — End: 2018-09-24
  Filled 2018-09-23: qty 3

## 2018-09-23 MED ORDER — VANCOMYCIN 1000 MG IN 250 ML NS IVPB VIAL-MATE (CNR)
1000.00 mg | Freq: Two times a day (BID) | INTRAVENOUS | Status: DC
Start: 2018-09-23 — End: 2018-09-23
  Filled 2018-09-23: qty 250

## 2018-09-23 MED ORDER — HYDROMORPHONE HCL 1 MG/ML IJ SOLN
0.5000 mg | INTRAMUSCULAR | Status: AC | PRN
Start: 2018-09-23 — End: 2018-09-23
  Administered 2018-09-23 (×3): 0.5 mg via INTRAVENOUS

## 2018-09-23 MED ORDER — GELATIN ABSORBABLE 100 EX MISC
CUTANEOUS | Status: DC | PRN
Start: 2018-09-23 — End: 2018-09-23
  Administered 2018-09-23: 1 via TOPICAL

## 2018-09-23 MED ORDER — CEFTRIAXONE SODIUM 2 G IJ SOLR
INTRAMUSCULAR | Status: AC
Start: 2018-09-23 — End: ?
  Filled 2018-09-23: qty 2000

## 2018-09-23 MED ORDER — INSULIN LISPRO 100 UNIT/ML SC SOLN
SUBCUTANEOUS | Status: AC
Start: 2018-09-23 — End: 2018-09-24
  Filled 2018-09-23: qty 6

## 2018-09-23 MED ORDER — PROMETHAZINE HCL 25 MG/ML IJ SOLN
6.2500 mg | Freq: Once | INTRAMUSCULAR | Status: DC | PRN
Start: 2018-09-23 — End: 2018-09-23

## 2018-09-23 MED ORDER — SODIUM CHLORIDE 0.9 % IV MBP
2.0000 g | Freq: Every day | INTRAVENOUS | Status: AC
Start: 2018-09-23 — End: 2018-09-23
  Administered 2018-09-23: 22:00:00 2 g via INTRAVENOUS
  Filled 2018-09-23: qty 2000

## 2018-09-23 MED ORDER — DOCUSATE SODIUM 100 MG PO CAPS
100.0000 mg | ORAL_CAPSULE | Freq: Two times a day (BID) | ORAL | Status: DC
Start: 2018-09-23 — End: 2018-09-24
  Administered 2018-09-23: 18:00:00 100 mg via ORAL
  Filled 2018-09-23 (×2): qty 1

## 2018-09-23 MED ORDER — PROPOFOL 10 MG/ML IV EMUL (WRAP)
INTRAVENOUS | Status: AC
Start: 2018-09-23 — End: ?
  Filled 2018-09-23: qty 20

## 2018-09-23 MED ORDER — VANCOMYCIN HCL 1 G IV SOLR
INTRAVENOUS | Status: AC
Start: 2018-09-23 — End: ?
  Filled 2018-09-23: qty 1000

## 2018-09-23 MED ORDER — LIDOCAINE HCL (PF) 1 % IJ SOLN
INTRAMUSCULAR | Status: DC | PRN
Start: 2018-09-23 — End: 2018-09-23
  Administered 2018-09-23: 50 mg via INTRAVENOUS

## 2018-09-23 MED ORDER — OXYCODONE-ACETAMINOPHEN 5-325 MG PO TABS
1.0000 | ORAL_TABLET | Freq: Once | ORAL | Status: AC | PRN
Start: 2018-09-23 — End: 2018-09-23

## 2018-09-23 MED ORDER — REMIFENTANIL HCL 1 MG IV SOLR
0.5000 ug/kg | Freq: Once | INTRAVENOUS | Status: DC
Start: 2018-09-23 — End: 2018-09-23

## 2018-09-23 MED ORDER — HYDROMORPHONE HCL 1 MG/ML IJ SOLN
INTRAMUSCULAR | Status: DC | PRN
Start: 2018-09-23 — End: 2018-09-23
  Administered 2018-09-23 (×2): .25 mg via INTRAVENOUS
  Administered 2018-09-23: 0.5 mg via INTRAVENOUS

## 2018-09-23 MED ORDER — VANCOMYCIN 1000 MG IN 250 ML NS IVPB VIAL-MATE (CNR)
1000.00 mg | Freq: Once | INTRAVENOUS | Status: AC
Start: 2018-09-23 — End: 2018-09-23
  Administered 2018-09-23: 09:00:00 1 g via INTRAVENOUS
  Filled 2018-09-23: qty 250

## 2018-09-23 MED ORDER — CYCLOBENZAPRINE HCL 10 MG PO TABS
5.0000 mg | ORAL_TABLET | Freq: Four times a day (QID) | ORAL | Status: DC | PRN
Start: 2018-09-23 — End: 2018-09-24
  Administered 2018-09-23 – 2018-09-24 (×3): 5 mg via ORAL
  Filled 2018-09-23 (×3): qty 1

## 2018-09-23 MED ORDER — INSULIN LISPRO 100 UNIT/ML SC SOLN
4.00 [IU] | Freq: Once | SUBCUTANEOUS | Status: AC
Start: 2018-09-23 — End: 2018-09-23
  Administered 2018-09-23: 23:00:00 4 [IU] via SUBCUTANEOUS
  Filled 2018-09-23: qty 12

## 2018-09-23 SURGICAL SUPPLY — 120 items
ADHESIVE SKIN CLOSURE DERMABOND PRINEO (Skin Closure) ×2
ADHESIVE SKIN CLOSURE DERMABOND PRINEO LIQUID 2-OCTYL CYANOACRYLATE (Skin Closure) ×2 IMPLANT
ADHESIVE SKNCLS 2-OCTYL CYNCRLT DRMBND (Skin Closure) ×1
AGENT HEMOSTATIC 10ML MATRIX FLOSEAL (Adhesion Barriers) ×2 IMPLANT
AGENT HMST 10ML FLSL  MTRX (Adhesion Barriers) ×2
AGENT HMST 10ML FLSL MTRX (Adhesion Barriers) ×1
APPLICATOR TIP MALLEABLE/TRIM (Procedure Accessories) ×3 IMPLANT
BULB DRAINAGE LIGHTWEIGHT LOW LEVEL (Drain) ×2
BULB DRAINAGE LIGHTWEIGHT LOW LEVEL SUCTION RELIAVAC SILICONE 100 CC (Drain) ×2 IMPLANT
BULB DRN SIL 100CC LF STRL LTWT LO LVL (Drain) ×1
CAGE SPINAL ROI-C W14 MM X H7 MM 7 D D12 (Cage) ×4 IMPLANT
CAGE SPINAL ROI-C W14 MM X H7 MM 7 D D12 MM TITANIUM TAPER PROFILE (Cage) ×4 IMPLANT
CAGE SPNL TI .41CC 7D 12MM ROI-C 14X7MM (Cage) ×2 IMPLANT
COLLAR MIAMI J UNIV W/PAD (Patient Supply) ×6 IMPLANT
COVER TABLE L90 IN X W65 IN REINFORCE (Drape) ×2
COVER TABLE L90 IN X W65 IN REINFORCE HEAVY DUTY CONVERTORS POLY (Drape) ×2 IMPLANT
COVER TBL POLY CNVRT 90X65IN STRL REINF (Drape) ×1
DISSECTOR LAPAROSCOPIC L.56 IN X W.25 IN (Instrument) ×2
DISSECTOR LAPAROSCOPIC L.56 IN X W.25 IN C5 HOLDER SPONGE XRAY (Instrument) ×2 IMPLANT
DISSECTOR LAPSCP CTTN GZE FM KTNR (Instrument) ×1
DRAIN INCISION FULL FLUTED FLAT BLAKE 7MMX20CM 072212 (Drain) IMPLANT
DRAIN SILC FLT FULL TROC 7MM (Drain)
DRAPE 74X41IN UNIVERSAL POLY XRAY C ARM CLOSURE STRAP (Drape) ×2 IMPLANT
DRAPE EQP VLCR POLY UNV STRDRP 74X41IN (Drape) ×3
DRAPE SRG IOBN 23X23IN STRL 2 INCS FLM (Drape) ×1
DRAPE SRG TBRN CNVRT 76X52IN LF STRL .75 (Drape) ×1
DRAPE SURGICAL 2 INCISE FILM (Drape) ×2
DRAPE SURGICAL 2 INCISE FILM ANTIMICROBIAL L23 IN X W23 IN IOBAN (Drape) ×2 IMPLANT
DRAPE SURGICAL 3/4 SHEET FANFOLD L76 IN (Drape) ×2
DRAPE SURGICAL 3/4 SHEET FANFOLD L76 IN X W52 IN TIBURON (Drape) ×2 IMPLANT
DRESSING ADH PLSTR CTTN KDL TELFA 10X4IN (Dressing) ×3
DRESSING TELFA AMD STRL 4X5IN (Dressing) ×1
DRESSING WOUND TELFA L10 IN X W4 IN ADHESIVE NON WOVEN POLYESTER (Dressing) ×2 IMPLANT
DRESSING WOUND TELFA L5 IN X W4 IN (Dressing) ×2
DRESSING WOUND TELFA L5 IN X W4 IN ISLAND HYPOALLERGENIC NONADHERENT (Dressing) ×2 IMPLANT
ELECTRODE ADULT PATIENT RETURN L9 FT REM POLYHESIVE ACRYLIC FOAM (Procedure Accessories) ×2 IMPLANT
ELECTRODE ELECTROSRGCL BLADE L2.75IN OD3/32 IN L.2IN STD SHAFT HEX LCK (Cautery) ×2 IMPLANT
ELECTRODE ELECTROSURGICAL BLADE L2.75 IN (Cautery) ×2
ELECTRODE ELECTROSURGICAL BLADE L4 IN (Cautery) ×2
ELECTRODE ELECTROSURGICAL BLADE L4 IN OD3/32 IN EDGE L.2 IN INSULATE (Cautery) ×2 IMPLANT
ELECTRODE ESURG BLDE EDG 3/32IN 2.75IN (Cautery) ×1
ELECTRODE ESURG BLDE EDG 3/32IN 4IN LF (Cautery) ×1
ELECTRODE PATIENT RETURN L9 FT VALLEYLAB (Procedure Accessories) ×2
ELECTRODE PT RTN RM PHSV ACRL FM C30- LB (Procedure Accessories) ×1
GLOVE SRG 7.5 BGL SRG LTX STRL PF BEAD (Glove) ×4
GLOVE SRG PLISPRN 7.5 BGL PI INDCTR (Glove) ×1
GLOVE SURGICAL 7 1/2 BIOGEL PI INDICATOR (Glove) ×2
GLOVE SURGICAL 7 1/2 BIOGEL PI INDICATOR UNDERGLOVE POWDER FREE SMOOTH (Glove) ×2 IMPLANT
GLOVE SURGICAL 7 1/2 BIOGEL SURGEONS (Glove) ×8
GLOVE SURGICAL 7 1/2 BIOGEL SURGEONS POWDER FREE BEAD CUFF TEXTURE (Glove) ×8 IMPLANT
GRAFT BN 5ML PUROS ALGRF (Allograft) ×1 IMPLANT
GRAFT BN RHBMP-2 BVN CLGN 1.4ML XS INFS (Allograft) ×1 IMPLANT
GRAFT BONE 5 ML ALLOGRAFT PUROS (Allograft) ×2 IMPLANT
GRAFT BONE RHBMP-2 BOVINE COLLAGEN 1.4 (Allograft) ×2 IMPLANT
GRAFT BONE RHBMP-2 BOVINE COLLAGEN 1.4 ML ALLOGRAPH ABSORBABLE SPONGE (Allograft) ×2 IMPLANT
IONM TECHNICAL SERVICES (Other) ×3
IONM TECHNICAL SERVICES 750010 (Other) ×2 IMPLANT
KIT IAH TURNOVER MAIN ROOM (Kits) ×3 IMPLANT
KIT PREP LABORATORY ERYTHROCYTE AND MARROW MAGELLAN MAR01 (Kits) ×2 IMPLANT
KIT PREP MARROW (Kits) ×3
MICROSCOPE DRAPE ZEISS (Drape) ×3 IMPLANT
NEEDLE HPO SS PP RW PRCSNGL 18GA 1.5IN (Needles) ×3
NEEDLE HYPODERMIC L1 1/2 IN OD18 GA REGULAR WALL BD PRECISIONGLIDE (Needles) ×2 IMPLANT
PACK SPINE (Pack) ×3 IMPLANT
PACK SRG ECLPS LF STRL ENT I (Pack) ×1
PACK SRG UNV ULTRAGARD LF (Pack) ×3
PACK SURGICAL CUSTOM ECLIPSE EENT I (Pack) ×2 IMPLANT
PACK SURGICAL ENT I MEDLINE (Pack) ×2
PACK SURGICAL UNIVERSAL ULTRAGARD LATEX FREE (Pack) ×2 IMPLANT
PEN SRGMRK REG FN 2 TIP RLR (Other) ×3
PEN SURGICAL MARKING REGULAR FINE 10733 DUAL TIP SKIN MARKER REGULAR (Other) ×6 IMPLANT
PEN SURGICAL MARKING REGULAR FINE 2 TIP (Other) ×6
PIN DISTRACTION L12 MM CASPAR MOBI-C (Disposable Instruments)
PIN DISTRACTION L12 MM CASPAR MOBI-C TITANIUM SPINE CERVICAL (Disposable Instruments) IMPLANT
PIN DSTRCT TI CSPR MOBI-C 12MM STRL (Disposable Instruments)
PLATE BN STD ROI-C 5-7MM 2 LVL LCK SPNE (Plate) ×2 IMPLANT
PLATE H5-7 MM STANDARD SPINE 2 LEVEL (Plate) ×4 IMPLANT
PLATE H5-7 MM STANDARD SPINE 2 LEVEL LOCK ROI-C BONE (Plate) ×4 IMPLANT
SOL IRR 0.9% NACL 500ML PLS PR BTL ISTNC (Irrigation Solutions) ×1
SOLUTION IRRIGATION 0.9% SDM CHLORIDE 500ML PR BTTL ISOTONIC NONPRGNC (Irrigation Solutions) ×2 IMPLANT
SOLUTION IRRIGATION 0.9% SODIUM CHLORIDE (Irrigation Solutions) ×2
SOLUTION SRGPRP 74% ISPRP 0.7% IOD (Prep) ×2
SOLUTION SURGICAL PREP 26 ML DURAPREP (Prep) ×4
SOLUTION SURGICAL PREP 26 ML DURAPREP 74% ISOPROPYL ALCOHOL 0.7% (Prep) ×4 IMPLANT
SPONGE GAUZE L4 IN X W4 IN 12 PLY CURITY (Sponge) ×4 IMPLANT
SPONGE GZE CTTN CRTY 4X4IN LF STRL 12 (Sponge) ×2
STAPLER SKIN L4.1 MM X W6.5 MM 35 WIDE (Staplers) ×4
STAPLER SKIN L4.1 MM X W6.5 MM 35 WIDE STAPLE CARTRIDGE APPOSE ULC (Staplers) ×4 IMPLANT
STAPLER SKN SS PLS APS U 4.1X6.5MM LF 35 (Staplers) ×2
STRIP SKIN CLOSURE L4 IN X W1/2 IN (Dressing) ×2
STRIP SKIN CLOSURE L4 IN X W1/2 IN REINFORCE STERI-STRIP POLYESTER (Dressing) ×2 IMPLANT
STRIP SKNCLS PLSTR STRSTRP 4X.5IN LF (Dressing) ×1
SUTURE ABS 0 CT1 VCL 18IN CR BRD 8 STRN (Suture)
SUTURE ABS 3-0 SH VCL 18IN CR BRD 8 STRN (Suture) ×1
SUTURE ABS 4-0 PS2 MNCRL MTPS 27IN MFL (Suture) ×1
SUTURE COATED VICRYL 0 CT-1 L18 IN (Suture) IMPLANT
SUTURE COATED VICRYL 3-0 SH L18 IN (Suture) ×2
SUTURE COATED VICRYL 3-0 SH L18 IN CONTROL BRD 8 STRAND UNDYED ABSRBBL (Suture) ×2 IMPLANT
SUTURE MONOCRYL 4-0 PS-2 L27 IN (Suture) ×2
SUTURE MONOCRYL 4-0 PS-2 L27 IN MONOFILAMENT UNDYED ABSORBABLE (Suture) ×2 IMPLANT
SYRINGE 20 ML BD LUER-LOK MEDICAL (Syringes, Needles) ×2 IMPLANT
SYRINGE MED 20ML LL LF STRL (Syringes, Needles) ×3
SYSTEM PSTN PNK PD 29X20X1IN NS (Positioning Supplies) ×3 IMPLANT
TAPE SRG CLTH DPRE 10YDX3IN LF HPOAL WTR (Tape) ×3
TAPE SURGICAL L10 YD X W3 IN (Tape) ×6
TAPE SURGICAL L10 YD X W3 IN HYPOALLERGENIC WATER RESISTANT (Tape) ×6 IMPLANT
TOOL DISSECTING L10 CM BARREL CYLINDER OD5 MM MIDAS REX LEGEND (Burr) ×2 IMPLANT
TOOL DISSECTING L14 CM MATCH HEAD FLUTE (Burr) ×2
TOOL DISSECTING L14 CM MATCH HEAD FLUTE OD3 MM MIDAS REX LEGEND (Burr) ×2 IMPLANT
TOOL DSCT BRL CYL MDSRX LGND 5MM 10CM (Burr) ×3
TOOL DSCT LGND 3MM 14MM (Burr) ×1
TOWEL L26 IN X W17 IN COTTON PREWASH (Procedure Accessories) ×2
TOWEL L26 IN X W17 IN COTTON PREWASH DELINT BLUE ACTISORB SURGICAL (Procedure Accessories) ×2 IMPLANT
TOWEL SRG CTTN 26X17IN LF STRL PREWASH (Procedure Accessories) ×1
TRAY SURESTEP FOLEY CATH 14F (Catheter Urine) ×3 IMPLANT
TUBING SCT MDVC MXGR 9/32IN 12FT LF STRL (Suction) ×2
TUBING SUCTION ID9/32 IN L12 FT (Suction) ×4
TUBING SUCTION ID9/32 IN L12 FT NONCONDUCTIVE MALE TO MALE CONNECTOR (Suction) ×4 IMPLANT
WIPE PERSONAL L7.9 IN X W7.9 IN POST INSERTION FOLEY SURESTEP PULP (Patient Supply) ×2 IMPLANT
WIPE PRSNL PULP PP SRSTP 7.9X7.9IN LF NS (Patient Supply) ×3

## 2018-09-23 NOTE — Plan of Care (Signed)
Problem: Safety  Goal: Patient will be free from injury during hospitalization  Outcome: Progressing  Flowsheets (Taken 09/23/2018 0256)  Patient will be free from injury during hospitalization : Assess patient's risk for falls and implement fall prevention plan of care per policy; Ensure appropriate safety devices are available at the bedside; Assess for patients risk for elopement and implement Elopement Risk Plan per policy; Provide alternative method of communication if needed (communication boards, writing); Include patient/ family/ care giver in decisions related to safety; Provide and maintain safe environment; Use appropriate transfer methods; Hourly rounding  Goal: Patient will be free from infection during hospitalization  Outcome: Progressing  Flowsheets (Taken 09/23/2018 0256)  Free from Infection during hospitalization: Assess and monitor for signs and symptoms of infection; Monitor all insertion sites (i.e. indwelling lines, tubes, urinary catheters, and drains); Monitor lab/diagnostic results; Encourage patient and family to use good hand hygiene technique     Problem: Pain  Goal: Pain at adequate level as identified by patient  Outcome: Progressing  Flowsheets (Taken 09/23/2018 0256)  Pain at adequate level as identified by patient: Identify patient comfort function goal; Assess for risk of opioid induced respiratory depression, including snoring/sleep apnea. Alert healthcare team of risk factors identified.; Assess pain on admission, during daily assessment and/or before any "as needed" intervention(s); Reassess pain within 30-60 minutes of any procedure/intervention, per Pain Assessment, Intervention, Reassessment (AIR) Cycle; Evaluate if patient comfort function goal is met; Offer non-pharmacological pain management interventions; Consult/collaborate with Physical Therapy, Occupational Therapy, and/or Speech Therapy; Include patient/patient care companion in decisions related to pain management as  needed; Consult/collaborate with Pain Service; Evaluate patient's satisfaction with pain management progress

## 2018-09-23 NOTE — Progress Note - Problem Oriented Charting Notewrit (Addendum)
INTERNAL MEDICINE PROGRESS NOTE    Date: 03/14/204:20 PM  Patient Name:ShariShari 48 y.o. female admitted with Diabetes mellitus, insulin dependent (IDDM), uncontrolled  Patient status: Inpatient  Hospital Day: 2     Assessment:    Diabetes type 2 insulin requiring uncontrolled with A1c 10.6   Cervical disc herniation status post un eventful C5- C7 ACDF on 09/23/2018   Hypertension   Obstructive sleep apnea on home CPAP   History of CHF compensated   Chronic neck pain secondary to above    Plan:    Discussed with neurosurgery Dr. [name]   Check blood glucose before each meal give Lantus 20 units now and continue on high intensity lispro insulin insulin coverage goal 100- 150   Continue on Lantus 20 units a.m. and 40 units at bedtime   Continue on oxycodone pain management   Lovenox 40 mg for DVT prophylaxis   Zofran for nausea vomiting   Morning lab   Continue other treatment   Plan of care discussed with patient and staff nurse   Discharge plan when cleared by neurosurgery    Subjective:   Patient was seen first in the recovery room after having cervical disc surgery  Reports feeling neck pain much better and the tingling and numbness  left arm fingers improved    Hospital problems:  Principal Problem:    Diabetes mellitus, insulin dependent (IDDM), uncontrolled  Active Problems:    Hyperglycemia due to type 2 diabetes mellitus    Status post cervical spinal fusion    CHF (congestive heart failure)    Moderate mixed hyperlipidemia not requiring statin therapy    Obstructive sleep apnea      Medications:      Scheduled Meds: PRN Meds:    bisacodyl, 10 mg, Rectal, Daily  [START ON 09/24/2018] cefTRIAXone, 2 g, Intravenous, Q24H  DULoxetine, 20 mg, Oral, BID  enoxaparin, 40 mg, Subcutaneous, Daily  hydroCHLOROthiazide, 25 mg, Oral, QHS  insulin glargine, 20 Units, Subcutaneous, QAM  insulin glargine, 20 Units, Subcutaneous, Once  insulin glargine, 40 Units, Subcutaneous, QHS  insulin lispro, , ,    insulin lispro, , ,   insulin lispro, 1-6 Units, Subcutaneous, QHS  insulin lispro, 2-10 Units, Subcutaneous, TID AC  isosorbide mononitrate, 30 mg, Oral, QHS  metFORMIN, 1,000 mg, Oral, BID Meals  nicotine, 1 patch, Transdermal, Daily  simvastatin, 40 mg, Oral, QHS  Tapentadol HCl, 150 mg, Oral, Q12H  vancomycin, 1,000 mg, Intravenous, Q12H  vitamin D (cholecalciferol), 25 mcg, Oral, Daily        Continuous Infusions:   sodium chloride 50 mL/hr at 09/23/18 0536    dexmedetomidine (PRECEDEX) infusion Stopped (09/23/18 1246)    lactated ringers 20 mL/hr at 09/23/18 0800    remifentanil (ULTIVA) infusion      dextrose, 15 g of glucose, PRN    And  glucagon (rDNA), 1 mg, PRN    And  dextrose, 125 mL, PRN  HYDROcodone-acetaminophen, 1 tablet, Q4H PRN  hydrOXYzine, 10 mg, Q6H PRN  lactated ringers, , Continuous PRN  morphine, 2 mg, Q4H PRN  naloxone, 0.2 mg, PRN  ondansetron, 4 mg, Q6H PRN    Or  ondansetron, 4 mg, Q6H PRN  senna, 8.6 mg, QHS PRN  zolpidem, 2.5 mg, QHS PRN          Review of Systems:   General:  Fever/chills: Absent   Head:Negative for Headache   Eyes: No blurred vision   ENT: Negative for tinnitus,nasal congestion/discharge,sore throat  Respiratory: Cough/  SOB- Absent  Cardiac: Chest pain/SOB- absent  Gastrointestinal: Nausea/vomiting/diarrhea/constipation-Absent, Abdominal pain- absent  Genitourinary: No burning micturition.   Musculoskeletal:Negative for - joint pain, muscular weakness or swelling   CNS/Neurological:No headache,weakness,blurred vision   Skin: Negative for rash  Hematologic: Negative for bruising  Psychiatric: Negative for depression     Physical Exam:   BP 116/65    Pulse 95    Temp 97.2 F (36.2 C) (Temporal)    Resp 16    Ht 1.575 m (5\' 2" )    Wt 98.4 kg (216 lb 14.9 oz)    SpO2 99%    BMI 39.68 kg/m     Intake and Output Summary (Last 24 hours) at Date Time    Intake/Output Summary (Last 24 hours) at 09/23/2018 1620  Last data filed at 09/23/2018 1316  Gross per 24 hour    Intake 2420 ml   Output 0 ml   Net 2420 ml       General appearance - alert, and in no distress  Head: NC/AT  Eyes:  PERL b/l  ENT: Hearing normal, no nasal discharge, o/p clear, mucous membrane moist  Neck -[place] J collar in place  Chest - clear to auscultation bilateral  Heart - normal S1 and S2 and regular rhythm  Abdomen - bowel sounds +ve, soft, non distended  Extremities - no pedal edema  Neurological - Alert, speech is clear and able to move all extremities without any lateralizing deficit  Skin: No rash    Labs:     Recent Labs   Lab 09/23/18  1412 09/22/18  1009 09/21/18  1047 09/18/18  0820   WBC 8.58 8.64 9.52* 11.40*   Hgb 14.2 14.9* 15.0* 15.6*   Hematocrit 43.1 44.2* 43.9* 45.7*   Platelets 242 260 265 315   MCV 92.5 91.1 91.3 92.7   Neutrophils  --  47.0 50.0 59.0     Recent Labs   Lab 09/23/18  1412 09/22/18  1009 09/21/18  1047   Sodium 137 137 135*   Potassium 3.5 3.8 3.7   Chloride 101 97* 96*   CO2 25 29 27    BUN 11.0 13.0 16.0   Creatinine 0.8 0.8 1.0   Glucose 200* 209* 241*   Calcium 9.7 10.2 9.7   Protein, Total 6.4  --  7.0   Albumin 3.7  --  3.8   AST (SGOT) 20  --  19   ALT 25  --  23   Alkaline Phosphatase 61  --  73   Bilirubin, Total 0.6  --  1.0       No results found for: BNP  Glucose:    Recent Labs   Lab 09/23/18  1412 09/22/18  1009 09/21/18  1047 09/18/18  0820   Glucose 200* 209* 241* 302*     Recent Labs   Lab 09/23/18  1412 09/22/18  1009 09/18/18  0820   PT 12.3* 12.5* 12.2*   PT INR 0.9 0.9 0.9   PTT 27 28  --          Rads:   No results found.    Solon Palm, MD  Internal Medicine  09/23/2018

## 2018-09-23 NOTE — Progress Notes (Signed)
Neck circumference 17.25 inches. Pt stable, see anterior neck surgery flowsheet.

## 2018-09-23 NOTE — Progress Notes (Signed)
Patient call to see charge nurse, me and Arnetha Courser went to talk to patient as she is c/o of her pain not being managed. Patient just received morphine IV at 1800 and Nucynta, and Norco at 1845. Tried to explain to patient that we will call her MD but she started cussing fowl language regarding her medical Doctor and we explain to her not to be abusive to staff. Patient started calling us black bitches and we told her that if she continues we will walk out of her room because we are not going to take any abuse from her as we are here to help her. Patient continues to scream profanity language so we walk out of the room. Anesthesia notified to address patient's pain issues.

## 2018-09-23 NOTE — Transfer of Care (Signed)
Anesthesia Transfer of Care Note    Patient: [name]    Procedures performed: Procedure(s) with comments:  C5-C7 ACDF - C5-C7 ACDF    Anesthesia type: General ETT    Patient location:Phase I PACU    Last vitals:   Vitals:    09/23/18 1319   BP: 139/70   Pulse: 95   Resp: 16   Temp: 36.2 C (97.2 F)   SpO2: 98%       Post pain: Patient not complaining of pain, continue current therapy      Mental Status:sedated    Respiratory Function: tolerating face mask    Cardiovascular: stable    Nausea/Vomiting: patient not complaining of nausea or vomiting    Hydration Status: adequate    Post assessment: no apparent anesthetic complications, no reportable events and no evidence of recall    Signed by: Derrell Lolling  09/23/18 1:19 PM

## 2018-09-23 NOTE — Transfer Summary (Signed)
Phase 1 discharge criteria met. Patient is awake and alert. Pain level is controlled to tolerable level. Respirations easy and non labored. All vital signs remain stable. Surgical dressing to anterior neck is clean dry and intact. Post-op handoff report tubed to [place], Charity fundraiser. Tomma Lightning stating that she received report and transport can be initiated. Pt transported via bed, escorted by transportation services, to room  2616. Pt resting comfortably in bed with no signs of pain or distress noted prior to leaving PACU.  Family updated on transfer.

## 2018-09-23 NOTE — Plan of Care (Signed)
Problem: Pain  Goal: Pain at adequate level as identified by patient  Outcome: Not Progressing  Flowsheets (Taken 09/23/2018 0256 by Tamsen Roers, RN)  Pain at adequate level as identified by patient: Identify patient comfort function goal;Assess for risk of opioid induced respiratory depression, including snoring/sleep apnea. Alert healthcare team of risk factors identified.;Assess pain on admission, during daily assessment and/or before any "as needed" intervention(s);Reassess pain within 30-60 minutes of any procedure/intervention, per Pain Assessment, Intervention, Reassessment (AIR) Cycle;Evaluate if patient comfort function goal is met;Offer non-pharmacological pain management interventions;Consult/collaborate with Physical Therapy, Occupational Therapy, and/or Speech Therapy;Include patient/patient care companion in decisions related to pain management as needed;Consult/collaborate with Pain Service;Evaluate patient's satisfaction with pain management progress  Note:   Pt reports pain 8 out of 10. Pt is very upset that pain is not managed. Pt's functional pain goal is 4 out of 10. Pt has Morphine q4 PRN for severe pain or if pt can not take PO meds. Pt also has Norco and Percocet q4 PRN for pain along with at home med Nucynta q12. Morphine, Norco, and Percocet has all been provided. Pt states pain is still an 8 out of 10. Pt became verbal stating "no one cares about my pain, I wish you were in this bed with this pain", "call pallative, someone needs to fix this." Dr. Catalina Antigua, and Anesthesia are aware.      Problem: Psychosocial and Spiritual Needs  Goal: Demonstrates ability to cope with hospitalization/illness  Outcome: Not Progressing  Flowsheets (Taken 09/22/2018 0241 by Deniece Ree, RN)  Demonstrates ability to cope with hospitalizations/illness: Encourage verbalization of feelings/concerns/expectations;Provide quiet environment;Assist patient to identify own strengths and abilities;Encourage  patient to set small goals for self  Note:   Pt is verbal and not content with pain management. Tried informing pt we have given her everything she has ordered and needs to give the medication time to work. Pt does not want to listen.      Problem: Psychosocial and Spiritual Needs  Goal: Demonstrates ability to cope with hospitalization/illness  Outcome: Not Progressing  Flowsheets (Taken 09/22/2018 0241 by Deniece Ree, RN)  Demonstrates ability to cope with hospitalizations/illness: Encourage verbalization of feelings/concerns/expectations;Provide quiet environment;Assist patient to identify own strengths and abilities;Encourage patient to set small goals for self  Note:   Pt is verbal and not content with pain management. Tried informing pt we have given her everything she has ordered and needs to give the medication time to work. Pt does not want to listen.      Problem: Peripheral Neurovascular Impairment  Goal: Extremity color, movement, sensation are maintained or improved  Outcome: Progressing  Flowsheets (Taken 09/23/2018 2212)  Extremity color, movement, sensation are maintained or improved : Increase mobility as tolerated/progressive mobility; Assess extremity for proper alignment; Assess and monitor application of corrective devices (cast, brace, splint), check skin integrity; VTE Prevention: Administer anticoagulant(s) and/or apply anti-embolism stockings/devices as ordered  Note:   Pt's pedal/radial pulses are +2. Capillary refill is less than 3 seconds, and no edema. Pt denies numbness or tingling in upper extremities. Pt reports some numbness in lower extremities but that is her baseline. Pain is not controlled. Pt is able to perform dorsiflexion and plantar flexion. Pt is able to move all extremities. Pt has SCD and Lovonox for VTE prevention.      Problem: Safety  Goal: Patient will be free from injury during hospitalization  Flowsheets (Taken 09/23/2018 0256 by Tamsen Roers, RN)  Patient  will be free  from injury during hospitalization : Assess patient's risk for falls and implement fall prevention plan of care per policy;Ensure appropriate safety devices are available at the bedside;Assess for patients risk for elopement and implement Elopement Risk Plan per policy;Provide alternative method of communication if needed [employer], writing);Include patient/ family/ care giver in decisions related to safety;Provide and maintain safe environment;Use appropriate transfer methods;Hourly rounding  Note:   Pt has yellow slipper socks, and falls risk bracelet on. Pt has call bell within reach and also has bed/chair alarms on. Pt is currently taking Morphine and understands this increases the risk of falls. Due to being on this medication pt understands it is best to call for assistance when getting up. Pt uses IV pole to ambulate, but ambulates well on her own. Informed pt she is not to bend or twist. Pt verbalized understanding.

## 2018-09-23 NOTE — Progress Notes (Signed)
Patient has sleep apnea do not use CPAP . OSA pathway started in Preop.pt instructed to use incentive spirometry in pre-op

## 2018-09-23 NOTE — Op Note (Signed)
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 Arthrodesis, anterior interbody technique, including diskectomy to prepare interspace, single level; C5-C6 (CPT Code 16109)  2. Arthrodesis, anterior interbody technique, including diskectomy to prepare interspace, additional levels; C6-C7 (CPT Code 60454)  3. Insertion of intervertebral biomechanical device with integral anterior instrumentation for device anchoring at C5-C6 and C6-C7 (CPT Code 09811 x2)  4. Use of operating microscope for microsurgical technique, and improved visualization and illumination  5. Use of morselized autograft for arthrodesis (CPT Code 91478)  6. Use of morselized allograft for arthrodesis (CPT Code 29562)  7. Injection of autologous platelet-rich plasma to fusion site to enhance arthrodesis (CPT Code 0232T)  8. Use of intraoperative neuromonitoring  9. Use of intraoperative fluoroscopy    Surgeon(s)  Greig Castilla A. Khali Perella, MD    Assistant(s)  Circulator: Alyson Reedy, RN  Relief Circulator: Bo Merino, RN  Relief Scrub: Armandina Stammer, [place]  Scrub Person: Cloyd Stagers, May J  First Assistant: Linna Caprice  First Assistant Relief: Lerry Paterson  Team Leader: Bo Merino, RN  No qualified resident physician was available to assist with the surgery due to other educational and clinical obligations. Linna Caprice acted as surgical first Geophysicist/field seismologist, assisting with positioning, exposure, retraction, and closure.    Anesthesia  General endotracheal    Estimated Blood Loss  25cc    Indications for the procedure  Patient is a 48 y.o. Female who presented with symptoms of myelopathy, as well as bilateral arm pain and burning and  numbness.  She was myelopathic on examination with evidence of weakness in her left hand.  Imaging revealed large disc herniations at C5-C6 and C6-C7 with spinal cord and bilateral nerve root compression.  I recommended surgical cervical spinal decompression and fusion. I discussed with the patient the various options, benefits and expectations of surgery. I also explained to the patient the risks of surgical intervention, which include but are not limited to death, infection, bleeding, spinal cord injury, nerve injury, paralysis, weakness, numbness, tingling, chronic pain, bowel/bladder dysfunction, hoarseness, problems with swallowing, CSF leak, vascular injury, stroke, heart/lung complications from anesthesia, failure of surgery, failure of instrumentation, need for another surgery in the future.  The patient voiced good understanding of these risks and elected to proceed with surgery. All questions were answered.    Description of Operation Performed, Including Technique  The patient was correctly identified and properly marked in the preoperative holding area. The patient was subsequently taken to the operating room and placed in a supine position on the operating table. General endotracheal anesthesia was induced. Intravenous antibiotics and Dexamethasone (10mg ) were then administered. An arterial line was placed by the anesthesia team. A Foley catheter was placed by the nursing team. Neuromonitoring wires were placed. Baseline neuromonitoring MEPs and SSEPs were obtained.  The patients head was slightly extended on a donut head rest. All pressure points were then padded. Meticulous attention was paid to pad all peripheral nerve sites. The arms were placed in an adducted position by the patient's side, and the shoulders were taped down. The patient was secured firmly to the operating table.  A chin-strap was placed under the  chin with 8 pounds of traction. Intraoperative neuromonitoring was again obtained,  which was unchanged compared to the pre-flip baseline signals.  Serial images of lateral fluoroscopy were obtained, and the level of the fusion was confirmed. The incision site was marked.   The surgical site was subsequently prepped and draped in the usual sterile fashion.  A #10 blade was then used to make an incision at the previously marked site. The subcutaneous tissue was opened with a combination of sharp dissection and bipolar cauterization. The platysma was subsequently divided horizontally, then undermined on both the superior and inferior sides. Subplatysmal flaps were raised. The anterior border of sternocleidomastoid was subsequently identified.  Blunt dissection was subsequently carried down the medial aspect of the sternocleidomastoid muscle. The common carotid artery was felt strongly pulsating, and dissection was continued medial to it. The prevertebral fascia was then incised, and the anterior surface of the spine was exposed. A hemostat was placed over the disk space and a lateral Xray was obtained, which confirmed the correct level. A second timeout was also performed, confirming that the level of operation agreed with that listed on the consent form.  The longus colli muscle was then undermined widely. A retractor system was placed under the longus colli muscle bilaterally and was firmly secured in place.  The operating microscope was then brought in. Under microscopic visualization, the anterior annulus at C5-C6 was incised with a #15 blade. A curette and a micro-pituitary were then used to remove the intervertebral disk. Anterior osteophytes were subsequently drilled flush using a matchstick drill bit. Both the superior and inferior endplates were squared off using the drill. A curette was used to ensure removal of all cartilaginous endplate material and until some cancellous bone bleeding was obtained from both the superior and inferior endplates.  The matchstick drill bit and a Kerrison  rongeur were subsequently employed to remove all remaining deep disk and bony material from the disk space until the posterior longitudinal ligament (PLL) was visualized. A small nerve hook was then placed under the PLL, and the PLL was incised using Kerrison rongeurs of various sizes, starting with a #1 Kerrison.  Once the PLL was completely released and the anterior surface of the dura was exposed, hemostasis was obtained by injecting surgifoam into the disk space. A Roi-C cervical cage (12x14x7) embedded with autologous bone, infuse, Puros DBM and PRP was subsequently placed into the disk space. The cage was secured into the C5 and C6 vertebral bodies using blades.  The same steps were repeated at the C6-C7 disk space. A Roi-C cervical cage (12x14x7) embedded with autologous bone, infuse, Puros DBM and PRP was subsequently placed into the disk space. The cage was secured into the C5 and C6 vertebral bodies using blades.  Final lateral and AP Xrays were obtained, which confirmed excellent placement of the hardware at the correct levels.  The surgical field was then irrigated with copious antibiotic irrigation. Meticulous hemostasis was obtained using bipolar cautery.  One #7 Jackson-Pratt drain was placed in the submuscular space through an inferior stab wound.  The platysma muscle and subcutaneous tissue were then closed using interrupted 3-0 Vicryl stitches. The skin was subsequently closed using a running subcuticular 4-0 Monocryl stitch.  Prineo dressing was then applied over the incision, and a sterile dressing was placed over the Prineo.  All counts were correct.  Neuromonitoring showed improvement in the right brachioradialis muscle by the end of the procedure.  A cervical collar was applied over the patients  neck. The patient was successfully extubated and taken out of the OR in stable condition.  I was present for the entirety of the procedure from incision to skin closure.    Implants  Implant Name Type  Inv. Item Serial No. Manufacturer Lot No. LRB No. Used Action   MATRIX PUROS DBM RPM GEL 5CC - [id number] Allograft MATRIX PUROS DBM RPM GEL 5CC  ZIMMER SPINE [id number] N/A 1 Implanted   GRAFT INFUSE BONE KIT XS 1.4CC - [id number] Allograft GRAFT INFUSE BONE KIT XS 1.4CC  MEDTRONIC SOFAMOR DANEK [id number] N/A 1 Implanted   CAGE ROI-C - [id number] Cage CAGE ROI-C  LDR SPINE [id number] N/A 1 Implanted   PLATE ROI-C VRTBRDG STD 5-7MM - [id number] Plate PLATE ROI-C VRTBRDG STD 5-7MM  LDR SPINE [id number] N/A 1 Implanted   PLATE ROI-C VRTBRDG STD 5-7MM - [id number] Plate PLATE ROI-C VRTBRDG STD 5-7MM  LDR SPINE [id number] N/A 1 Implanted   CAGE ROI-C - [id number] Cage CAGE ROI-C  LDR SPINE [id number] N/A 1 Implanted       Drains, Catheters, or Packing Left in Place  #7 Jackson-Pratt drain - submuscular    Findings  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 Callum Wolf, MD  Minimally Invasive and Complex Spine Surgery & General Neurosurgery  Department of Neurosciences   [facility]  Assistant Professor of Neurosurgery   [facility] Address: [address]   [place], [place] [address]  Office Phone [phone]   Appointments [phone]   Fax [phone]

## 2018-09-23 NOTE — Anesthesia Postprocedure Evaluation (Signed)
Anesthesia Post Evaluation    Patient: [name]    Procedure(s) with comments:  C5-C7 ACDF - C5-C7 ACDF    Anesthesia type: general    Last Vitals:   Vitals Value Taken Time   BP 110/66 09/23/2018  2:45 PM   Temp 36.2 C (97.2 F) 09/23/2018  1:19 PM   Pulse 97 09/23/2018  2:45 PM   Resp 12 09/23/2018  2:45 PM   SpO2 98 % 09/23/2018  2:45 PM                 Anesthesia Post Evaluation:     Patient Evaluated: PACU  Patient Participation: complete - patient participated  Level of Consciousness: awake and alert    Pain Management: adequate    Airway Patency: patent    Anesthetic complications: No      PONV Status: none    Cardiovascular status: acceptable  Respiratory status: acceptable  Hydration status: acceptable        Signed by: Leontine Locket, 09/23/2018 2:52 PM

## 2018-09-24 ENCOUNTER — Inpatient Hospital Stay: Payer: BC Managed Care – PPO

## 2018-09-24 ENCOUNTER — Other Ambulatory Visit: Payer: BC Managed Care – PPO

## 2018-09-24 LAB — COMPREHENSIVE METABOLIC PANEL
ALT: 18 U/L (ref 0–55)
AST (SGOT): 13 U/L (ref 5–34)
Albumin/Globulin Ratio: 1.2 (ref 0.9–2.2)
Albumin: 3.4 g/dL — ABNORMAL LOW (ref 3.5–5.0)
Alkaline Phosphatase: 53 U/L (ref 37–106)
Anion Gap: 10 (ref 5.0–15.0)
BUN: 9 mg/dL (ref 7.0–19.0)
Bilirubin, Total: 1.1 mg/dL (ref 0.2–1.2)
CO2: 27 mEq/L (ref 22–29)
Calcium: 8.7 mg/dL (ref 8.5–10.5)
Chloride: 101 mEq/L (ref 100–111)
Creatinine: 0.7 mg/dL (ref 0.6–1.0)
Globulin: 2.8 g/dL (ref 2.0–3.6)
Glucose: 82 mg/dL (ref 70–100)
Potassium: 3.5 mEq/L (ref 3.5–5.1)
Protein, Total: 6.2 g/dL (ref 6.0–8.3)
Sodium: 138 mEq/L (ref 136–145)

## 2018-09-24 LAB — CBC AND DIFFERENTIAL
Absolute NRBC: 0 10*3/uL (ref 0.00–0.00)
Basophils Absolute Automated: 0.04 10*3/uL (ref 0.00–0.08)
Basophils Automated: 0.3 %
Eosinophils Absolute Automated: 0.09 10*3/uL (ref 0.00–0.44)
Eosinophils Automated: 0.7 %
Hematocrit: 37.4 % (ref 34.7–43.7)
Hgb: 12.5 g/dL (ref 11.4–14.8)
Immature Granulocytes Absolute: 0.09 10*3/uL — ABNORMAL HIGH (ref 0.00–0.07)
Immature Granulocytes: 0.7 %
Lymphocytes Absolute Automated: 3.33 10*3/uL — ABNORMAL HIGH (ref 0.42–3.22)
Lymphocytes Automated: 25.6 %
MCH: 31.1 pg (ref 25.1–33.5)
MCHC: 33.4 g/dL (ref 31.5–35.8)
MCV: 93 fL (ref 78.0–96.0)
MPV: 9.8 fL (ref 8.9–12.5)
Monocytes Absolute Automated: 0.91 10*3/uL — ABNORMAL HIGH (ref 0.21–0.85)
Monocytes: 7 %
Neutrophils Absolute: 8.57 10*3/uL — ABNORMAL HIGH (ref 1.10–6.33)
Neutrophils: 65.7 %
Nucleated RBC: 0 /100 WBC (ref 0.0–0.0)
Platelets: 238 10*3/uL (ref 142–346)
RBC: 4.02 10*6/uL (ref 3.90–5.10)
RDW: 12 % (ref 11–15)
WBC: 13.03 10*3/uL — ABNORMAL HIGH (ref 3.10–9.50)

## 2018-09-24 LAB — ECG 12-LEAD
Atrial Rate: 92 {beats}/min
P Axis: 58 degrees
P-R Interval: 162 ms
Q-T Interval: 410 ms
QRS Duration: 104 ms
QTC Calculation (Bezet): 507 ms
R Axis: 75 degrees
T Axis: 35 degrees
Ventricular Rate: 92 {beats}/min

## 2018-09-24 LAB — GFR: EGFR: >60

## 2018-09-24 LAB — PT AND APTT
PT INR: 1 (ref 0.9–1.1)
PT: 12.9 s (ref 12.6–15.0)
PTT: 22 s — ABNORMAL LOW (ref 23–37)

## 2018-09-24 LAB — TROPONIN I: Troponin I: <0.01 ng/mL (ref 0.00–0.05)

## 2018-09-24 LAB — GLUCOSE WHOLE BLOOD - POCT
Whole Blood Glucose POCT: 114 mg/dL — ABNORMAL HIGH (ref 70–100)
Whole Blood Glucose POCT: 168 mg/dL — ABNORMAL HIGH (ref 70–100)
Whole Blood Glucose POCT: 96 mg/dL (ref 70–100)

## 2018-09-24 LAB — HEMOLYSIS INDEX: Hemolysis Index: 5 (ref 0–18)

## 2018-09-24 MED ORDER — NALOXONE HCL 4 MG/0.1ML NA LIQD
NASAL | 0 refills | Status: AC
Start: 2018-09-24 — End: ?

## 2018-09-24 MED ORDER — CYCLOBENZAPRINE HCL 5 MG PO TABS
5.0000 mg | ORAL_TABLET | Freq: Four times a day (QID) | ORAL | 0 refills | Status: DC | PRN
Start: 2018-09-24 — End: 2019-10-22

## 2018-09-24 MED ORDER — HYDROCODONE-ACETAMINOPHEN 5-325 MG PO TABS
1.0000 | ORAL_TABLET | ORAL | 0 refills | Status: AC | PRN
Start: 2018-09-24 — End: 2018-10-01

## 2018-09-24 MED ORDER — ASPIRIN EC 325 MG PO TBEC
325.00 mg | DELAYED_RELEASE_TABLET | Freq: Every day | ORAL | Status: DC
Start: 2018-09-24 — End: 2018-09-24

## 2018-09-24 MED ORDER — ASPIRIN EC 325 MG PO TBEC
325.00 mg | DELAYED_RELEASE_TABLET | Freq: Once | ORAL | Status: AC
Start: 2018-09-24 — End: 2018-09-24
  Administered 2018-09-24: 07:00:00 325 mg via ORAL
  Filled 2018-09-24: qty 1

## 2018-09-24 MED ORDER — OXYCODONE-ACETAMINOPHEN 5-325 MG PO TABS
2.0000 | ORAL_TABLET | ORAL | Status: DC | PRN
Start: 2018-09-24 — End: 2018-09-24
  Administered 2018-09-24 (×2): 2 via ORAL
  Filled 2018-09-24 (×2): qty 2

## 2018-09-24 MED ORDER — NALOXONE HCL 4 MG/0.1ML NA LIQD
NASAL | 0 refills | Status: DC
Start: 2018-09-24 — End: 2018-09-24

## 2018-09-24 MED ORDER — MORPHINE SULFATE 2 MG/ML IJ/IV SOLN (WRAP)
2.0000 mg | Status: DC | PRN
Start: 2018-09-24 — End: 2018-09-24
  Administered 2018-09-24 (×2): 2 mg via INTRAVENOUS
  Filled 2018-09-24 (×2): qty 1

## 2018-09-24 NOTE — Progress Notes (Signed)
INTERNAL MEDICINE PROGRESS NOTE    Date: 03/15/209:31 AM  Patient Name:[name],[name] 48 y.o. female admitted with Diabetes mellitus, insulin dependent (IDDM), uncontrolled  Patient status: Inpatient  Hospital Day: 3     Assessment:    Cervical disc herniation status post uneventful C5 C7 ACDF on 09/23/2018   Chronic pain opiate dependent   Hypertension controlled   Type 2 diabetes insulin requiring uncontrolled on admission now controlled blood glucose running between 82-168   Obstructive sleep apnea on CPAP at home   History of CHF compensated   Reported nonspecific atypical chest pain at 6 in the morning which has now resolved   Hypokalemia likely to HCTZ patient wants to continue on it   Leukocytosis patient is nontoxic likely reactive post surgery    Plan:    Discussed on the phone with neurosurgery Dr..Fanous   Escalate dose of morphine to 2 mg every 2 hours for breakthrough pain   Percocet 2 tablets every 4 hours hold for excessive sedation   Continue on oxycodone   Continue on Lantus insulin 20 units subcu at a.m. 40 at night   Continue checking blood glucose before each meal with lispro insulin high intensity correctional coverage goal 100-150   Lovenox for DVT prophylaxis   Continue other treatment   Discharge plan when cleared by speciality   Plan of care discussed with patient in detail in the presence of staff nurse    Subjective:   My pain level is >8 , and been calling for extra pain medication all night.  States she goes to pain management clinic and she has developed opioid tolerance  Even threatening seeking to be discharged if your pain is not controlled  Complained  having chest pain at 6 AM, given ASA reviewed EKG no acute ST-T changes and troponin is negative  Tingling and numbness on the left arm and fingers has improved after the surgery  Hospital problems:  Principal Problem:    Diabetes mellitus, insulin dependent (IDDM), uncontrolled  Active Problems:    Hyperglycemia  due to type 2 diabetes mellitus    Status post cervical spinal fusion    CHF (congestive heart failure)    Moderate mixed hyperlipidemia not requiring statin therapy    Obstructive sleep apnea      Medications:      Scheduled Meds: PRN Meds:    bisacodyl, 10 mg, Rectal, Daily  cefTRIAXone, 2 g, Intravenous, Q24H  docusate sodium, 100 mg, Oral, Q12H  DULoxetine, 20 mg, Oral, BID  enoxaparin, 40 mg, Subcutaneous, Daily  hydroCHLOROthiazide, 25 mg, Oral, QHS  insulin glargine, 20 Units, Subcutaneous, QAM  insulin glargine, 40 Units, Subcutaneous, QHS  insulin lispro, 1-6 Units, Subcutaneous, QHS  insulin lispro, 2-10 Units, Subcutaneous, TID AC  isosorbide mononitrate, 30 mg, Oral, QHS  metFORMIN, 1,000 mg, Oral, BID Meals  nicotine, 1 patch, Transdermal, Daily  simvastatin, 40 mg, Oral, QHS  Tapentadol HCl, 150 mg, Oral, Q12H  vitamin D (cholecalciferol), 25 mcg, Oral, Daily        Continuous Infusions:   sodium chloride 50 mL/hr at 09/23/18 0536    sodium chloride 80 mL/hr at 09/23/18 2016    lactated ringers 20 mL/hr at 09/23/18 0800    acetaminophen, 650 mg, Q4H PRN  cyclobenzaprine, 5 mg, Q6H PRN  dextrose, 15 g of glucose, PRN    And  glucagon (rDNA), 1 mg, PRN    And  dextrose, 125 mL, PRN  HYDROcodone-acetaminophen, 1 tablet, Q4H PRN  hydrOXYzine, 10 mg,  Q6H PRN  lactated ringers, , Continuous PRN  morphine, 2 mg, Q4H PRN  morphine, 2 mg, Q2H PRN  naloxone, 0.2 mg, PRN  ondansetron, 4 mg, Q6H PRN    Or  ondansetron, 4 mg, Q6H PRN  ondansetron, 4 mg, Q4H PRN  oxyCODONE-acetaminophen, 1 tablet, Q4H PRN  oxyCODONE-acetaminophen, 2 tablet, Q4H PRN  polyethylene glycol, 17 g, Daily PRN  senna, 8.6 mg, QHS PRN  zolpidem, 2.5 mg, QHS PRN          Review of Systems:   General:  Fever/chills: Absent   Head:Negative for Headache   Eyes: No blurred vision   ENT: Positive for severe neck pain negative for tinnitus,nasal congestion/discharge,sore throat  Respiratory: Cough/ SOB- Absent  Cardiac: Chest pain/SOB-  absent  Gastrointestinal: Nausea/vomiting/diarrhea/constipation-Absent, Abdominal pain- absent  Genitourinary: No burning micturition.   Musculoskeletal:Negative for - joint pain, muscular weakness or swelling   CNS/Neurological:No headache,weakness,blurred vision   Skin: Negative for rash  Hematologic: Negative for bruising  Psychiatric: Negative for depression     Physical Exam:   BP 113/81    Pulse 93    Temp 98.1 F (36.7 C) (Oral)    Resp 17    Ht 1.575 m (5\' 2" )    Wt 94.4 kg (208 lb 1.6 oz)    SpO2 100%    BMI 38.06 kg/m     Intake and Output Summary (Last 24 hours) at Date Time    Intake/Output Summary (Last 24 hours) at 09/24/2018 0931  Last data filed at 09/24/2018 0431  Gross per 24 hour   Intake 2300 ml   Output 540 ml   Net 1760 ml       General appearance - alert, and in no distress  Head: NC/AT  Eyes:  PERL b/l  ENT: Hearing normal, no nasal discharge, o/p clear, mucous membrane moist  Neck -JP tube drainage clear scanty serosanguineous.  [place] J collar in place  Chest - clear to auscultation bilateral  Heart - normal S1 and S2 and regular rhythm  Abdomen - bowel sounds +ve, soft, non distended  Extremities - no pedal edema  Neurological - Alert, walking around no localizing neurologic deficit  Skin: No rash    Labs:     Recent Labs   Lab 09/24/18  0633 09/23/18  1412 09/22/18  1009 09/21/18  1047   WBC 13.03* 8.58 8.64 9.52*   Hgb 12.5 14.2 14.9* 15.0*   Hematocrit 37.4 43.1 44.2* 43.9*   Platelets 238 242 260 265   MCV 93.0 92.5 91.1 91.3   Neutrophils 65.7  --  47.0 50.0     Recent Labs   Lab 09/24/18  0448 09/23/18  1412 09/22/18  1009 09/21/18  1047   Sodium 138 137 137 135*   Potassium 3.5 3.5 3.8 3.7   Chloride 101 101 97* 96*   CO2 27 25 29 27    BUN 9.0 11.0 13.0 16.0   Creatinine 0.7 0.8 0.8 1.0   Glucose 82 200* 209* 241*   Calcium 8.7 9.7 10.2 9.7   Protein, Total 6.2 6.4  --  7.0   Albumin 3.4* 3.7  --  3.8   AST (SGOT) 13 20  --  19   ALT 18 25  --  23   Alkaline Phosphatase 53 61  --  73    Bilirubin, Total 1.1 0.6  --  1.0       No results found for: BNP  Glucose:    Recent  Labs   Lab 09/24/18  0448 09/23/18  1412 09/22/18  1009 09/21/18  1047 09/18/18  0820   Glucose 82 200* 209* 241* 302*     Recent Labs   Lab 09/24/18  0448 09/23/18  1412 09/22/18  1009   PT 12.9 12.3* 12.5*   PT INR 1.0 0.9 0.9   PTT 22* 27 28         Rads:   Fluoroscopy Less Than 1 Hour    Result Date: 09/24/2018   Intraoperative fluoroscopic guidance and digital spot views.  Laurena Slimmer, MD 09/24/2018 7:09 AM      Solon Palm, MD  Internal Medicine  09/24/2018

## 2018-09-24 NOTE — Progress Notes (Signed)
Provided discharge instructions to pt, acknowledged understanding. Removed intact IV catheter, pt tolerated well.  Provided written prescriptions to pt.

## 2018-09-24 NOTE — Plan of Care (Signed)
Problem: Safety  Goal: Patient will be free from injury during hospitalization  Outcome: Progressing  Flowsheets (Taken 09/23/2018 0256 by Tamsen Roers, RN)  Patient will be free from injury during hospitalization : Assess patient's risk for falls and implement fall prevention plan of care per policy;Ensure appropriate safety devices are available at the bedside;Assess for patients risk for elopement and implement Elopement Risk Plan per policy;Provide alternative method of communication if needed [employer], writing);Include patient/ family/ care giver in decisions related to safety;Provide and maintain safe environment;Use appropriate transfer methods;Hourly rounding     Problem: Pain  Goal: Pain at adequate level as identified by patient  Outcome: Progressing  Flowsheets (Taken 09/23/2018 0256 by Tamsen Roers, RN)  Pain at adequate level as identified by patient: Identify patient comfort function goal;Assess for risk of opioid induced respiratory depression, including snoring/sleep apnea. Alert healthcare team of risk factors identified.;Assess pain on admission, during daily assessment and/or before any "as needed" intervention(s);Reassess pain within 30-60 minutes of any procedure/intervention, per Pain Assessment, Intervention, Reassessment (AIR) Cycle;Evaluate if patient comfort function goal is met;Offer non-pharmacological pain management interventions;Consult/collaborate with Physical Therapy, Occupational Therapy, and/or Speech Therapy;Include patient/patient care companion in decisions related to pain management as needed;Consult/collaborate with Pain Service;Evaluate patient's satisfaction with pain management progress     Problem: Diabetes: Glucose Imbalance  Goal: Blood glucose stable at established goal  Outcome: Progressing  Flowsheets (Taken 09/23/2018 0256 by Tamsen Roers, RN)  Blood glucose stable at established goal : Monitor lab values;Include patient/family in decisions  related to nutrition/dietary selections;Coordinate medication administration with meals, as indicated;Ensure appropriate consults are obtained (Nutrition, Diabetes Education, and Case Management);Monitor intake and output.  Notify LIP if urine output is < 30 mL/hour.;Follow fluid restrictions/IV/PO parameters;Assess for hypoglycemia /hyperglycemia;Monitor/assess vital signs;Ensure adequate hydration;Ensure appropriate diet and assess tolerance;Ensure patient/family has adequate teaching materials     Problem: Peripheral Neurovascular Impairment  Goal: Extremity color, movement, sensation are maintained or improved  Outcome: Progressing  Flowsheets (Taken 09/24/2018 0505)  Extremity color, movement, sensation are maintained or improved : Increase mobility as tolerated/progressive mobility; Assess and monitor application of corrective devices (cast, brace, splint), check skin integrity; Assess extremity for proper alignment; Teach/review/reinforce ankle pump exercises; VTE Prevention: Administer anticoagulant(s) and/or apply anti-embolism stockings/devices as ordered  Note:   Patient complain of pain 8-10/10 despite multiple pain medication administration, patient cursing, verbally agressive to staffs , CN reaching out to the anesthesiologist, Dr. Catalina Antigua also notified and order one time order, patient continue to state her pain is not managed yet, continue to monitor

## 2018-09-24 NOTE — Progress Notes (Signed)
Patient complain of chest pain, pressure type of pain, not radiating anywhere, 6/10, Dr. Catalina Antigua notified did EKG, trop *1 negative , Asprin 325 given once  Report given to day RN to follow up

## 2018-09-24 NOTE — PT Eval Note (Signed)
[name]  Physical Therapy Evaluation and D/C     Patient: [name]  MRN#: [id number]  Unit: [address] SURG  Bed: A2616/A2616-01    Time of Evaluation:  Time Calculation  PT Received On: 09/24/18  Start Time: 0829  Stop Time: 0842  Time Calculation (min): 13 min    Chart Review and Collaboration with Care Team: 6 minutes, not included in above time.    PT Visit Number: 1    Consult received for [name] for PT Evaluation and Treatment.  Patients medical condition is appropriate for Physical therapy intervention at this time.    Activity Orders:  PT eval and treat, progressive mobility protocol, activity as tolerated, ambulate with assistance, up as tolerated and up to chair    Precautions and Contraindications:  Precautions  Weight Bearing Status: no restrictions  Neck Brace Applied: at all times  Other Precautions: Falls    Medical Diagnosis:  Cervical spinal cord compression [G95.20]  Type 2 diabetes mellitus with hyperglycemia, with long-term current use of insulin [E11.65, Z79.4]  Hyperglycemia [R73.9]    History of Present Illness:  [name] is a 48 y.o. female admitted on 09/21/2018 with past medical history of diabetes mellitus type 2 uncontrolled hypertension, hyperlipidemia, cervical disc herniation, neck pain with numbness of the left upper extremity finger. Pt is POD 1 cervical decompression and fusion of C5-6 and C6-7.    Patient Active Problem List   Diagnosis    Hyperglycemia due to type 2 diabetes mellitus    Diabetes mellitus, insulin dependent (IDDM), uncontrolled    Status post cervical spinal fusion    CHF (congestive heart failure)    Moderate mixed hyperlipidemia not requiring statin therapy    Obstructive sleep apnea       Past Medical/Surgical History:  Past Medical History:   Diagnosis Date    CHF (congestive heart failure)     diastolic, per pt never had acute episode of CHF    Complication of anesthesia     almost chewed through her tongue and  severe chills in past    Coronary artery disease     mild non-obstructive ASCAD on heart cath report 12/2015    Depression     Diabetes mellitus     Fibromyalgia     Hidradenitis     Hydradenitis     Hyperlipidemia     Hypertension     Malignant neoplasm of skin     basal/ squamos    Neuropathy     Sleep apnea     Type 2 diabetes mellitus, controlled      Past Surgical History:   Procedure Laterality Date    BREAST SURGERY  1998    reduction x2    CARDIAC SURGERY  12/2015    L heart cath, diagnosis mild non-obstrutive ASCAD    CESAREAN SECTION  2008    with tummy tuck    COSMETIC SURGERY  1990s,2000s    thigh 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    HAND SURGERY Right     x3, cyst    INCISION AND DRAINAGE OF WOUND      multiple I&D due to hidradenitis    TONSILLECTOMY         X-Rays/Tests/Labs:  Lab Results   Component Value Date/Time    HGB 12.5 09/24/2018 06:33 AM    HCT 37.4 09/24/2018 06:33 AM    K 3.5 09/24/2018 04:48 AM    NA 138 09/24/2018 04:48 AM  INR 1.0 09/24/2018 04:48 AM    TROPI <0.01 09/24/2018 06:33 AM       All imaging reviewed, please see chart for details.    Social History:  Prior Level of Function  Prior level of function: Independent with ADLs, Ambulates independently  Baseline Activity Level: Community ambulation  Driving: independent  Cooking: Yes  Employment: FT(on leave now)  DME Currently at Home: Other (Comment)(none)    Home Living Arrangements  Living Arrangements: Children  Type of Home: House  Home Layout: Multi-level, Stairs to enter with rails (add number in comment), Other (Comment)(1 FOS STE with HR, 1 FOS to bedroom)  Bathroom Shower/Tub: Pension scheme manager: Standard  Bathroom Equipment: Other (Comment)(none)  Bathroom Accessibility: Accessible  DME Currently at Home: Other (Comment)(none)    Subjective:  Patient is agreeable to participation in the therapy session. Nursing clears patient for therapy.  Patient Goal: to go home   Pain  Assessment  Pain Assessment: Numeric Scale (0-10)  Pain Score: 8-severe pain  Pain Location: Neck      Objective:  Observation of Patient/Vital Signs:  VSS    Patient received in bed with intravenous (IV) line and cervical collar in place.    Inspection/Posture: pt sitting on bed with C collar.     Cognitive Status and Neuro Exam:  Cognition/Neuro Status  Arousal/Alertness: Appropriate responses to stimuli  Attention Span: Appears intact  Orientation Level: Oriented X4  Memory: Appears intact  Following Commands: Follows all commands and directions without difficulty  Safety Awareness: independent  Insights: Fully aware of deficits  Problem Solving: Able to problem solve independently  Behavior: agitated;cooperative  Motor Planning: intact  Coordination: intact    Musculoskeletal Examination  Gross ROM  Neck/Trunk ROM: unable to assess(cervical collar at all times)  Right Upper Extremity ROM: within functional limits  Left Upper Extremity ROM: within functional limits  Right Lower Extremity ROM: within functional limits  Left Lower Extremity ROM: within functional limits  Gross Strength  Neck/Trunk Strength: WFL  Right Upper Extremity Strength: within functional limits  Left Upper Extremity Strength: within functional limits  Right Lower Extremity Strength: within functional limits  Left Lower Extremity Strength: within functional limits  Tone  Tone: within functional limits    Functional Mobility:  Functional Mobility  Rolling: Independent  Supine to Sit: Independent  Scooting to HOB: Independent  Scooting to EOB: Independent  Sit to Supine: Independent  Sit to Stand: Independent  Stand to Sit: Independent  Transfers  Bed to Chair: Independent  Chair to Bed: Independent  Stand Pivot Transfers: Independent  Squat Pivot Transfers: Independent  Locomotion  Ambulation: Supervision  Pattern: within functional limits  Stair Management: Supervision;one rail R;alternating pattern  Number of Stairs: 8      Balance  Balance  Balance: within functional limits    Participation and Activity Tolerance  Participation and Endurance  Participation Effort: excellent  Endurance: Endurance does not limit participation in activity    Educated the patient to role of physical therapy, plan of care, goals of therapy and safety with mobility and ADLs, discharge instructions, home safety.    Patient left in bed with SCDs in place and call bell and all personal items/needs within reach. RN notified of session outcome.      Assessment:  [name] is a 48 y.o. female admitted 09/21/2018. Patient is at baseline and has no inpatient skilled PT needs at this time. There are few comorbidities or other factors that affect plan of  care and require modification of task including:BP fluctuations, CHF, diabetes, psych history, recent surgery, residual neurological symptoms.  Pt's functional mobility is not impacted at this time.   Standardized tests and exams incorporated into evaluation include AMPAC mobility and balance.   Pt demonstrates a stable clinical presentation.      Complexity Level Hx and Co-  morbidites Examination Clinical Decision Making Clinical Presentation   Low no impact 1-2 elements Limited options Stable     Pt is high level functionally, able to amb and negotiate stairs with normal/fast pace, no LOB or difficulties reported besides 8/10 neck pain. All questions answered, pt cleared for PT standpoint.     Plan:  D/C inpatient PT    PMP Activity: Step 7 - Walks out of Room  Distance Walked (ft) (Step 6,7): 250 Feet      AM-PAC:yes        PT Basic Mobility Raw Score: 24  CMS 0-100% Score: 0.00%               Goals:  N/A        DME Recommended for Discharge: Other (Comment)(none needed)  Discharge Recommendation: Home with no needs, Other(Comment)(outpatient when neurosurgeon clears)    [name] Acre, DPT  09/24/2018 8:59 AM

## 2018-09-24 NOTE — Discharge Summary (Signed)
DISCHARGE SUMMARY    Date Time: 09/24/18 4:03 PM  Patient Name: [name]  Attending Physician: Solon Palm, MD    Date of Admission:   09/21/2018    Date of Discharge:   09/24/2018    Reason for Admission:   Cervical spinal cord compression [G95.20]  Type 2 diabetes mellitus with hyperglycemia, with long-term current use of insulin [E11.65, Z79.4]  Hyperglycemia [R73.9]    Discharge Dx:    Cervical disc herniation status post uneventful C5 C7 ACDF on 09/23/2018   Chronic pain opiate dependent   Hypertension controlled   Type 2 diabetes insulin requiring uncontrolled on admission now controlled blood glucose running between 82-168   Obstructive sleep apnea on CPAP at home   History of CHF compensated   Reported nonspecific atypical chest pain at 6 in the morning which has now resolved   Hypokalemia likely to HCTZ patient wants to continue on it   Leukocytosis patient is nontoxic likely reactive post surgery    Consultations:   Treatment Team:   Attending Provider: Solon Palm, MD  Consulting Physician: Christophe Louis, DO  Consulting Physician: Nanda Quinton, MD  Consulting Physician: Roanna Raider, MD      Procedures performed:     Cervical disc herniation with myelopathy status post uneventful C5 C7 ACDF on 09/23/2018 with remarkable improvement  Discharge Medications:        Discharge Medication List      Taking    BD Pen Needle Nano U/F 32G X 4 MM Misc  Generic drug:  Insulin Pen Needle  USE AS DIRECTED DAILY     cyclobenzaprine 5 MG tablet  Dose:  5 mg  Commonly known as:  FLEXERIL  Take 1 tablet (5 mg total) by mouth every 6 (six) hours as needed for Muscle spasms     DULoxetine 20 MG capsule  Dose:  20 mg  Commonly known as:  CYMBALTA  Take 20 mg by mouth 2 (two) times daily     glipiZIDE XL 5 MG 24 hr tablet  Dose:  5 mg  Commonly known as:  GLUCOTROL XL  Take 5 mg by mouth nightly      GlucoCom Blood Glucose Monitor Devi  USE TO CHECK BLOOD SUGARS UP TO THREE TIMES DAILY      hydroCHLOROthiazide 25 MG tablet  Dose:  25 mg  Commonly known as:  HYDRODIURIL  Take 25 mg by mouth nightly      HYDROcodone-acetaminophen 5-325 MG per tablet  Dose:  1 tablet  Commonly known as:  NORCO  Take 1 tablet by mouth every 4 (four) hours as needed for Pain     insulin glargine 100 UNIT/ML injection pen  Dose:  52 Units  Inject 52 Units into the skin nightly      isosorbide mononitrate 30 MG 24 hr tablet  Dose:  30 mg  Commonly known as:  IMDUR  Take 30 mg by mouth nightly      Lancets Misc  USE TO CHECK BLOOD SUGARS UP TO THREE TIMES DAILY     LORazepam 2 MG tablet  Dose:  2 mg  Commonly known as:  ATIVAN  Take 2 mg by mouth daily as needed      metFORMIN 1000 MG tablet  Dose:  1,000 mg  Commonly known as:  GLUCOPHAGE  Take 1,000 mg by mouth 2 (two) times daily with meals      naloxone 4 MG/0.1ML nasal spray  Commonly known as:  NARCAN  1 spray intranasally. If pt does not respond or relapses into respiratory depression call 911. Give additional doses every 2-3 min.     Nucynta ER 150 MG Tb12  Dose:  150 mg  Generic drug:  Tapentadol HCl  Take 150 mg by mouth 2 (two) times daily      * glucose blood test strip  Dose:  1 each  1 each by Other route     * OneTouch Verio test strip  Generic drug:  glucose blood  Check blood sugars 3 times daily.   Diagnosis: Uncontrolled Type 2 DM     simvastatin 40 MG tablet  Dose:  40 mg  Commonly known as:  ZOCOR  Take 40 mg by mouth nightly      triazolam 0.125 MG tablet  Dose:  0.125 mg  Commonly known as:  HALCION  Take 0.125 mg by mouth nightly as needed      Vitamin D3 2000 UNIT capsule  Dose:  2,000 Units  Take 2,000 Units by mouth nightly         * This list has 2 medication(s) that are the same as other medications prescribed for you. Read the directions carefully, and ask your doctor or other care provider to review them with you.                  Hospital Course:   [name] is a 48 y.o. female with past medical history of diabetes mellitus type 2  uncontrolled hypertension, hyperlipidemia, cervical disc herniation, neck pain with numbness of the left upper extremity finger was seen by neurosurgery and is scheduled for surgery on 09/23/18.  Patient blood sugars are controlled and admitted to optimize diabetes prior surgery.  Patient complains of chronic neck pain and numbness of the left hand fingers.  She also has numbness on the right side and also numbness of the toes.  She was scheduled for surgery in January but canceled because blood sugar was not controlled.  Her recent hemoglobin A1c on 09/18/2018 was 10.6.  She denies fever, chills, nausea, vomiting.  Patient recently had left buttock cyst removed and the skin is dry now.     Upon admission patient was cleared by medicine for elective cervical disc surgery.  Blood glucose was controlled in the range between 100 250.  Patient had undergone uneventful C5 C7 ACDF on 09/23/2018 with remarkable subjective improvement in the tingling or numbness of the fingers.  However patient was having insisted pain of the neck and the claim attributes that she did on opiate as an outpatient for many years and had tolerance to opiate medications always asking to increase the dose of different opiate medications.  Finally pain was controlled and patient was evaluated by neurosurgery and PT OT and cleared for discharge.  Patient was given prescription for Percocet and Narcan and I advised that to use how to use the Narcan in case.  But at the same time patient was caution needed not to take pain medications and referrals only to her pain because they can lead to unexpected death and respiratory failure.  Patient's diabetes was uncontrolled on admission adjustment was made on her insulin and advised to check your blood glucose before each meal.  Medications were reconciled.  Patient was advised to follow-up with neurosurgery and her pain management within 1 week.  Patient was given excuse from DT for 3 days.  Discharge plan was  discussed with patient and staff nurse and patient  voices understanding.        Laboratory Data     CBC  Recent Labs   Lab 09/24/18  0633 09/23/18  1412 09/22/18  1009 09/21/18  1047 09/18/18  0820   WBC 13.03* 8.58 8.64 9.52* 11.40*   Hgb 12.5 14.2 14.9* 15.0* 15.6*   Hematocrit 37.4 43.1 44.2* 43.9* 45.7*   Platelets 238 242 260 265 315   MCV 93.0 92.5 91.1 91.3 92.7   Neutrophils 65.7  --  47.0 50.0 59.0       CMP  Recent Labs   Lab 09/24/18  0448 09/23/18  1412 09/22/18  1009 09/21/18  1047 09/18/18  0820   Sodium 138 137 137 135* 131*   Potassium 3.5 3.5 3.8 3.7 4.3   Chloride 101 101 97* 96* 92*   CO2 27 25 29 27 27    BUN 9.0 11.0 13.0 16.0 14.0   Creatinine 0.7 0.8 0.8 1.0 1.0   Glucose 82 200* 209* 241* 302*   Calcium 8.7 9.7 10.2 9.7 10.5   Protein, Total 6.2 6.4  --  7.0  --    Albumin 3.4* 3.7  --  3.8  --    AST (SGOT) 13 20  --  19  --    ALT 18 25  --  23  --    Alkaline Phosphatase 53 61  --  73  --    Bilirubin, Total 1.1 0.6  --  1.0  --        Lipid panel        Recent Labs   Lab 09/18/18  0820   Hemoglobin A1C 10.6*       No results found for: TSH    No results for input(s): TSH, FREET3, T4FREE in the last 8760 hours.    Cardiac enzymes  Recent Labs   Lab 09/24/18  0633   Troponin I <0.01       Recent Labs   Lab 09/24/18  0448 09/23/18  1412 09/22/18  1009   PT 12.9 12.3* 12.5*   PT INR 1.0 0.9 0.9   PTT 22* 27 28       Culture:   Microbiology Results     None          All radiology result for current encounter  Xr Chest 2 Views    Result Date: 09/22/2018   No active disease is seen in the chest. Laurena Slimmer, MD 09/22/2018 11:32 AM    Fluoroscopy Less Than 1 Hour    Result Date: 09/24/2018   Intraoperative fluoroscopic guidance and digital spot views.  Laurena Slimmer, MD 09/24/2018 7:09 AM    Xr Cervical Spine Limited 2 Or 3 Views    Result Date: 09/24/2018  Patient status post recent fusion of the C5-6 and C6-7 levels. Fonnie Mu, MD 09/24/2018 9:50 AM      Echo Results     None            Physical  Exam:   BP 122/82    Pulse 98    Temp 97.8 F (36.6 C) (Oral)    Resp 15    Ht 1.575 m (5\' 2" )    Wt 94.4 kg (208 lb 1.6 oz)    SpO2 97%    BMI 38.06 kg/m     General appearance - alert, and in no distress  HEENT: Normocephalic,atraumatic, pupil equal  Neck -[place] J collar in place  Chest - clear to  auscultation  Heart - normal rate and regular rhythm  Abdomen - bowel sounds normal, soft, non distended  Extremities - no pedal edema  Neurological - Alert, oriented x3 ambulatory no lateralizing neuro deficit    Discharge condition:   Stable     Disposition:   Home     Discharge  Diet :   Cardiac     Discharge Instructions:   Follow up:     Dione Plover, NP  [address]  [address]  [place] [address]  [phone]              Solon Palm, MD  09/24/2018  4:03 PM  Time spent 60  minutes 50% of the time was spent counseling patient the risks and benefits of opioid medications, to take extra precaution not to increase the dose of pain medications because of renal pain,  use of Narcan and the need to follow-up with pain clinic management.  Site suggested to the patient that if she can ask care pain management clinic to start on Suboxone instead of increasing narcotic pain medication due to development of tolerance.  Advised patient follow-up with neurosurgery and primary care.  Advised patient controlled her blood glucose check before each meal with goal between 100-150.      *This note was generated by the Epic EMR system/ Dragon speech recognition and may contain inherent errors or omissions not intended by the user. Grammatical errors, random word insertions, deletions, pronoun errors and incomplete sentences are occasional consequences of this technology due to software limitations. Not all errors are caught or corrected. If there are questions or concerns about the content of this note or information contained within the body of this dictation they should be addressed directly with the author for clarification

## 2018-09-24 NOTE — Plan of Care (Signed)
Problem: Safety  Goal: Patient will be free from infection during hospitalization  Outcome: Progressing  Flowsheets (Taken 09/24/2018 1126)  Free from Infection during hospitalization: Monitor lab/diagnostic results; Monitor all insertion sites (i.e. indwelling lines, tubes, urinary catheters, and drains); Assess and monitor for signs and symptoms of infection; Encourage patient and family to use good hand hygiene technique     Problem: Pain  Goal: Pain at adequate level as identified by patient  Outcome: Progressing  Flowsheets (Taken 09/24/2018 1126)  Pain at adequate level as identified by patient: Identify patient comfort function goal; Assess for risk of opioid induced respiratory depression, including snoring/sleep apnea. Alert healthcare team of risk factors identified.; Assess pain on admission, during daily assessment and/or before any "as needed" intervention(s); Reassess pain within 30-60 minutes of any procedure/intervention, per Pain Assessment, Intervention, Reassessment (AIR) Cycle; Evaluate if patient comfort function goal is met; Evaluate patient's satisfaction with pain management progress; Offer non-pharmacological pain management interventions; Consult/collaborate with Pain Service; Consult/collaborate with Physical Therapy, Occupational Therapy, and/or Speech Therapy; Include patient/patient care companion in decisions related to pain management as needed     Problem: Peripheral Neurovascular Impairment  Goal: Extremity color, movement, sensation are maintained or improved  Outcome: Progressing  Flowsheets (Taken 09/24/2018 1126)  Extremity color, movement, sensation are maintained or improved : Increase mobility as tolerated/progressive mobility; Assess and monitor application of corrective devices (cast, brace, splint), check skin integrity; Teach/review/reinforce ankle pump exercises; Assess extremity for proper alignment; VTE Prevention: Administer anticoagulant(s) and/or apply anti-embolism  stockings/devices as ordered

## 2018-09-24 NOTE — Discharge Instr - AVS First Page (Addendum)
[facility] - NEUROSURGERY    Postoperative Instructions following Back/Neck Surgery    FOLLOW-UP APPOINTMENTS:  Please review your discharge paperwork. Your first 2 post-operative follow-up appointments have already been scheduled for you. The times, dates and locations will be in your discharge summary. The first appointment is a wound check with the nurse typically 10-14 days after surgery. The second appointment is approximately 4 weeks after surgery with Dr. [name]. If you have undergone a fusion procedure, then imaging will need to be completed prior to your 4 week follow-up appointment with Dr. [name]. Please make sure to bring in the CD that contains your imaging to this appointment as well. If for any reason you are unable to attend the time and date scheduled for you, please contact the office to reschedule your appointment for the next closest time/date.     EXPECTATIONS:  After surgery, you can expect to feel a little weak and tired, but you should become stronger every day. It is normal to feel some pain and discomfort around your incision, as well as in your arms or legs. There may be some residual numbness or weakness after surgery, which may take months to go away, and which may never completely resolve. Call us if the symptoms suddenly worsen after surgery.    DRESSING CARE:  You may remove the outer dressing 3 days after surgery. If your surgery was on a Monday, then on Thursday morning you can remove the dressing. After you remove the outer dressing, make sure the incision is always clean, dry and open to air. If you have steri-strips (little pieces of tape covering the wound) or another adhesive dressing over the incision, leave them in place until they fall off themselves. You will need someone to help you remove the dressing.    WOUND CARE:  Most patients have internal or external stitches that do not need to be removed. You may have removable external stitches or staples. We  will inform you if you have them, for you will need to return to the office in 7-14 days for their removal. A nurse visit has already been scheduled for you. If your discharge paperwork does not contain a date/time for your wound check visit with the nurse, please contact the office at [phone] to schedule an appointment within 7-14 days.  If you notice redness, swelling or drainage around your incision, or develop a fever >101.31F, please call the office immediately at [phone] for instructions.  Do not apply any lotions, gels, ointments or other products to your incision unless directed by your physician.    ACTIVITY:  No bending or lifting anything heavier than a carton of milk until you see Dr. [name] in your follow up appointment. It is very important to get up and be moving at home, but it is equally important not to overdo activities and exhaust yourself. Plan to have some assistance at home for the first week. You should be able to walk, take stairs (carefully and slowly), use the bathroom, and get up from sitting to standing using the arms of the chair as support.  You can start walking the day you get home from the hospital, and slowly increase your activity back to baseline. For example, increase from a short, less than 5-minute walk the first few days, to around-the-block and up to 15-20 minutes after a week. Do not bend, twist or lean to pick things up or change position. Remember good back mechanics and use your knees and  legs to sit down and stand up.  Avoid activity that causes you to hold your breath and strain, such as lifting, moving heavy objects or straining during bowel movements.    SHOWERING:  You may take a sponge bath any time after your surgery. Make sure your dressing and/or your incision are always dry. Wet incisions can get easily infected. You may shower 5 days after the surgery. Again, make sure to keep your incision dry as much as possible. A handheld shower head can be  useful in keeping the incision dry. You may need assistance with sponge baths and with showering.  If you have a brace or a collar, you may remove it very briefly while in the shower, but avoid bending, flexing or leaning over. You may need assistance. After 5 days of surgery, you do not need to clean the wound with anything more than soap and water. Baby shampoo is preferred over regular shampoo for the first few weeks following the surgery. Do not soak the incision in a bathtub. When you exit the shower, carefully dry the wound with a towel. Do not touch, rub, scrub or scratch the incision. If the steri-strips or another adhesive dressing are still in place, do not take them off and let them fall off by themselves.    SEXUAL ACTIVITY:  Sexual activity is not advised immediately after your surgery. The jarring mechanism should be avoided because it can hurt your surgery. After 10 to 14 days, you may slowly resume activity with your back/neck supported by the bed/surface. Use common sense and avoid any movement that you feel might hurt your surgery.    DRIVING:  Avoid driving for the first 2 weeks after surgery. If you have a cervical collar, you need to wait until you have followed up with Dr. [name] in clinic and are out of the collar before you can start driving again. Avoid driving while you are on narcotic pain medications.    WORK:  Do not return to work until you have been advised to do so by Dr. [name]. In general, we recommend taking 4 weeks off of work for recovery. However, each individual situation will be reviewed independently.    HOUSEWORK:  Avoid vacuuming or doing laundry and household chores until you have followed up in the clinic. The bending and lifting motion can harm your back and/or neck.    PHYSICAL THERAPY:  We will discuss physical therapy at your first follow up visit. You will not begin therapy until after your first follow up appointment.    MEDICATIONS:  You will receive a  prescription for pain medication and stool softener. You may have pain after the surgery but should not need to take narcotic pain medication for more than one week. Keep in mind there is a narcotic component to the medication and you should avoid driving or operating any vehicle while taking these medications. Narcotic medications can cause drowsiness as well as constipation. Continue taking the stool softener to keep your bowels moving. If you use all of your pain medications and desire a refill, we will evaluate your situation and you may need a special referral to a pain doctor at that time. You may take Tylenol (acetaminophen) for pain, but keep in mind there is acetaminophen in your narcotic pain medication as well, so do not take both.  Please avoid alcoholic beverages while taking pain medication.  Do not take NSAID (aspirin, ibuprofen/Motrin, naproxen/Aleve) or steroid medications unless specifically instructed to do  so. Moreover, you should NOT resume your blood thinning medications such as coumadin, heparin, Aggrenox, Lovenox, Plavix or aspirin unless specifically instructed to do so by your surgeon.  You may resume your home medications as indicated on your discharge form.  If you have any questions about your medications please call Dr. [name]' office or your pharmacist.    SMOKING:  Smoking has deleterious and harmful effects on your surgery and on your health. It will delay the healing process from surgery and will cause many complications. We request that you avoid smoking completely.    PROBLEMS or CONCERNS:  Please feel free to call the office at any time for problems, concerns, or questions you may have. We are always available to help you answer your questions and address your concerns. In case of an emergency, please call 911 or go to the nearest Emergency Department.  Concerning symptoms to watch out for include but are not limited to   - Significant redness, swelling or drainage from the  incision site.  - New symptoms of numbness, tingling or weakness in your face or extremities.  - Severe headache, nausea or vomiting.  - Hallucinations, confusion, fainting, blacking out, memory loss.  - Problems with understanding or producing speech.  - New double or blurred vision, or loss of vision.  - Stiff neck.  - Fever greater than 101.39F.  - Severe sensitivity to light (photophobia).  - Seizures.  - Swollen painful calf or legs.  - Sudden shortness of breath and racing pulse.      Hoyle Sauer. 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   [name] in one week   Follow-up with pain management in 3 to 4 to 4 days  Continue on Lantus insulin 20 units in the morning and 40 units at night  Check blood glucose before each meal goal 100-150 and take aspart insulin coverage as per instruction  Follow-up with your primary care doctor in 1 week

## 2018-09-25 ENCOUNTER — Other Ambulatory Visit (INDEPENDENT_AMBULATORY_CARE_PROVIDER_SITE_OTHER): Payer: Self-pay | Admitting: Physician Assistant

## 2018-09-25 ENCOUNTER — Encounter: Payer: Self-pay | Admitting: Neurological Surgery

## 2018-09-25 DIAGNOSIS — Z981 Arthrodesis status: Secondary | ICD-10-CM

## 2018-09-26 MED FILL — Remifentanil HCl For IV Soln 2 MG: INTRAVENOUS | Qty: 2000 | Status: AC

## 2018-09-27 ENCOUNTER — Encounter: Payer: Self-pay | Admitting: Family Nurse Practitioner

## 2018-09-28 ENCOUNTER — Encounter (INDEPENDENT_AMBULATORY_CARE_PROVIDER_SITE_OTHER): Payer: Self-pay

## 2018-10-04 ENCOUNTER — Encounter (INDEPENDENT_AMBULATORY_CARE_PROVIDER_SITE_OTHER): Payer: Self-pay

## 2018-10-05 ENCOUNTER — Ambulatory Visit (INDEPENDENT_AMBULATORY_CARE_PROVIDER_SITE_OTHER): Payer: BC Managed Care – PPO

## 2018-10-06 ENCOUNTER — Ambulatory Visit (INDEPENDENT_AMBULATORY_CARE_PROVIDER_SITE_OTHER): Payer: BC Managed Care – PPO

## 2018-10-19 ENCOUNTER — Encounter (INDEPENDENT_AMBULATORY_CARE_PROVIDER_SITE_OTHER): Payer: Self-pay | Admitting: Physician Assistant

## 2018-10-23 NOTE — Progress Notes (Signed)
NEUROSURGERY ATTENDING - FOLLOW-UP NOTE      Date: 10/23/2018  Patient Name: [name]    Please CC and send a report to:  Patient Care Team:  Dione Plover, NP as PCP - General (Nurse Practitioner)    Due to the COVID-19 virus and guidelines, the patient's follow up appointment was conducted over zoom for the safety of the patient, other patients, and office staff has agreed with having the follow-up appointment via zoom call.    Verbal consent has been obtained from the patient to conduct a zoom visit encounter to minimize exposure to COVID-19: yes    Diagnosis / Chief Complaint:     Status post C5-6, C6-7 ACDF on 09/23/2018    History of Present Illness:     I had the pleasure of seeing [name] in follow-up today regarding the aforementioned chief complaint via zoom.     [name] is a 48 y.o. right-handedfemalewith a history of type 2 DM and HTN who presented with symptoms of myelopathy, as well as bilateral arm pain and burning and numbness. She was myelopathic on examination with evidence of weakness in her left hand. Imaging revealed large disc herniations at C5-C6 and C6-C7 with spinal cord and bilateral nerve root compression. She underwent C5-6, C6-7 ACDF on 09/23/2018. She was discharged on 09/24/2018. She returns today via zoom with x-rays which will be reviewed below.     Today, the patient rates her pain a 2/10.  Pain is located in the left arm and shoulder and extends into her fourth and fifth digit of the left hand.  She reports overall that her symptoms are improving since surgery.  She continues to have some numbness and clumsiness in the hands.  She denies any dysphasia, hoarseness, specific focal motor weakness, bowel or bladder dysfunction, imbalance issues or gait dysfunction.  She continues to take Nucynta and hydrocodone which is helping to alleviate her pain.  She follows up with her pain management provider next week.  She received her bone growth stimulator but  is not using it yet.  Overall, she is pleased with her progress thus far.    History was obtained from chart review and the patient.      Review of Systems:     See HPI.     Past Medical History:     Past Medical History:   Diagnosis Date    CHF (congestive heart failure)     diastolic, per pt never had acute episode of CHF    Complication of anesthesia     almost chewed through her tongue and severe chills in past    Coronary artery disease     mild non-obstructive ASCAD on heart cath report 12/2015    Depression     Diabetes mellitus     Fibromyalgia     Hidradenitis     Hydradenitis     Hyperlipidemia     Hypertension     Malignant neoplasm of skin     basal/ squamos    Neuropathy     Sleep apnea     Type 2 diabetes mellitus, controlled        Past Surgical History:     Past Surgical History:   Procedure Laterality Date    BREAST SURGERY  1998    reduction x2    CARDIAC SURGERY  12/2015    L heart cath, diagnosis mild non-obstrutive ASCAD    CESAREAN SECTION  2008    with tummy tuck  COSMETIC SURGERY  1990s,2000s    thigh 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    DECOMPRESSION, ANTERIOR CERVICAL, FUSION, LEVELS 2 N/A 09/23/2018    Procedure: DECOMPRESSION, ANTERIOR CERVICAL, FUSION, LEVELS 2;  Surgeon: Nanda Quinton, MD;  Location: [facility];  Service: Neurosurgery;  Laterality: N/A;    DISCECTOMY, ANTERIOR CERVICAL, FUSION, LEVELS 2 N/A 09/23/2018    Procedure: C5-C7 ACDF; Use of operating microscope for microsurgical technique, and improved visualization and illumination; Use of morselized autograft for arthrodesis (CPT Code 16109); Use of morselized allograft for arthrodesis (CPT Code 60454); Injection of autologous platelet-rich plasma to fusion site to enhance arthrodesis (CPT Code 0232T); Use of intraoperative neuromonitoring; Use of intra    HAND SURGERY Right     x3, cyst    INCISION AND DRAINAGE OF WOUND      multiple I&D due to hidradenitis    TONSILLECTOMY         Family  History:     Family History   Problem Relation Age of Onset    Hyperlipidemia Mother     Alcohol abuse Father     Hyperlipidemia Father        Social History:     Social History     Socioeconomic History    Marital status: Single     Spouse name: Not on file    Number of children: Not on file    Years of education: Not on file    Highest education level: Not on file   Occupational History    Not on file   Social Needs    Financial resource strain: Not on file    Food insecurity:     Worry: Not on file     Inability: Not on file    Transportation needs:     Medical: Not on file     Non-medical: Not on file   Tobacco Use    Smoking status: Former Smoker     Packs/day: 1.50     Years: 30.00     Pack years: 45.00     Last attempt to quit: 09/21/2018     Years since quitting: 0.0    Smokeless tobacco: Never Used   Substance and Sexual Activity    Alcohol use: Never     Frequency: Never    Drug use: Never    Sexual activity: Not on file   Lifestyle    Physical activity:     Days per week: Not on file     Minutes per session: Not on file    Stress: Not on file   Relationships    Social connections:     Talks on phone: Not on file     Gets together: Not on file     Attends religious service: Not on file     Active member of club or organization: Not on file     Attends meetings of clubs or organizations: Not on file     Relationship status: Not on file    Intimate partner violence:     Fear of current or ex partner: Not on file     Emotionally abused: Not on file     Physically abused: Not on file     Forced sexual activity: Not on file   Other Topics Concern    Not on file   Social History Narrative    Not on file       Allergies:     Allergies  Allergen Reactions    Wellbutrin [Bupropion] Other (See Comments)     tachycardia    Enablex [Darifenacin] Rash    Gabapentin Rash    Lyrica [Pregabalin] Rash    Nucynta [Tapentadol] Rash     Instant release only; does well with extended release     Vesicare [Solifenacin] Rash       Medications:     Current Outpatient Medications on File Prior to Visit   Medication Sig Dispense Refill    Blood Glucose Monitoring Suppl (GLUCOCOM BLOOD GLUCOSE MONITOR) Device USE TO CHECK BLOOD SUGARS UP TO THREE TIMES DAILY      Cholecalciferol (VITAMIN D3) 2000 UNIT capsule Take 2,000 Units by mouth nightly      cyclobenzaprine (FLEXERIL) 5 MG tablet Take 1 tablet (5 mg total) by mouth every 6 (six) hours as needed for Muscle spasms 12 tablet 0    DULoxetine (CYMBALTA) 20 MG capsule Take 20 mg by mouth 2 (two) times daily      glipiZIDE XL (GLUCOTROL XL) 5 MG 24 hr tablet Take 5 mg by mouth nightly         glucose blood (ONETOUCH VERIO) test strip Check blood sugars 3 times daily.   Diagnosis: Uncontrolled Type 2 DM      glucose blood test strip 1 each by Other route      hydroCHLOROthiazide (HYDRODIURIL) 25 MG tablet Take 25 mg by mouth nightly         insulin glargine (BASAGLAR KWIKPEN) 100 UNIT/ML injection pen Inject 52 Units into the skin nightly         Insulin Pen Needle (BD PEN NEEDLE NANO U/F) 32G X 4 MM Misc USE AS DIRECTED DAILY      isosorbide mononitrate (IMDUR) 30 MG 24 hr tablet Take 30 mg by mouth nightly         Lancets Misc USE TO CHECK BLOOD SUGARS UP TO THREE TIMES DAILY      LORazepam (ATIVAN) 2 MG tablet Take 2 mg by mouth daily as needed         metFORMIN (GLUCOPHAGE) 1000 MG tablet Take 1,000 mg by mouth 2 (two) times daily with meals         naloxone (NARCAN) 4 MG/0.1ML nasal spray 1 spray intranasally. If pt does not respond or relapses into respiratory depression call 911. Give additional doses every 2-3 min. 2 each 0    NUCYNTA ER 150 MG Tablet SR 12 hr Take 150 mg by mouth 2 (two) times daily         simvastatin (ZOCOR) 40 MG tablet Take 40 mg by mouth nightly         triazolam (HALCION) 0.125 MG tablet Take 0.125 mg by mouth nightly as needed          No current facility-administered medications on file prior to visit.         Vital Signs:     Due to the patient's preference and  COVID 19 CDC recommendations, the vital signs are deferred at this time.     Physical Exam:     General: No acute distress, cooperative with examination  Psychologic: Affect appropriate, judgment and insight consistent with situation, no delusions or hallucinations  Eyes: Sclerae anicteric, no conjunctival injection  ENT: No visible otorrhea, no rhinorrhea, trachea midline  Head: Normocephalic  Musculoskeletal: Full ROM  Pulmonary: Normal respiratory effort, no audible wheezing    Neuro exam:   Awake, alert, orientedx3  Speech clear and fluent  Attention span normal  EOMI  Face symmetric  Hearing intact  Tongue midline  Shoulder shrug strong bilaterally  Motor:  Moving all extremities.     Gait normal   able to tandem gait  able to toe and heel walk    Wearing cervical collar with what appears to be proper fit.   Incision: clean, dry and intact. There is no edema, erythema or foul smelling odor.     Labs:     Lab Results   Component Value Date    WBC 13.03 (H) 09/24/2018    HGB 12.5 09/24/2018    HCT 37.4 09/24/2018    MCV 93.0 09/24/2018    PLT 238 09/24/2018     Lab Results   Component Value Date    NA 138 09/24/2018    K 3.5 09/24/2018    CL 101 09/24/2018    CO2 27 09/24/2018     Lab Results   Component Value Date    INR 1.0 09/24/2018    INR 0.9 09/23/2018    INR 0.9 09/22/2018    PT 12.9 09/24/2018    PT 12.3 (L) 09/23/2018    PT 12.5 (L) 09/22/2018     Lab Results   Component Value Date    BUN 9.0 09/24/2018     Lab Results   Component Value Date    CREAT 0.7 09/24/2018       Imaging:     XR Cervical Spine AP/lateral views 10/12/2018:  COMPARISON: No relevant prior studies were available for review.  FINDINGS: There is interbody fusion identified at C5-C6 and C6-C7. There are no definite complications identified.  Incidental note is made for calcifications overlying the left carotid artery. If the patient is clinically symptomatic or there auditory  bruits this might warrant further evaluation follow-up to include carotid duplex imaging.  IMPRESSION:   1. Postsurgical changes as described without identifiable complication. Other incidental findings as discussed above.     I reviewed the patient's imaging myself. My own interpretation of the x-rays of the cervical spine is that there is interbody fusion at C5-6 and C6-7. There is no evidence of hardware malfunction.     Assessment:     48 y.o. female 4 weeks status post C5-6, C6-7 ACDF.  She is recovering very well postoperatively and is noticing an improvement in her symptoms with surgery.  Physical examination is grossly intact.  X-rays demonstrate an excellent appearance to her anterior cervical fusion.      Plan:     I had an extensive discussion with patient today regarding the patient's condition. I reviewed the patient's imaging with her today.     My recommendations at this time include:  1. She may begin weaning out of her cervical collar by wearing it 1 hour less each day.  I went over in detail the weaning schedule.  The patient verbally demonstrated understanding.  2. I discussed with the patient the utility of physical therapy.  At this time, the patient reports that she does not wish to undergo formal physical therapy but will try exercises on her own at home.  I discussed with the patient that if she would like to reconsider physical therapy, she is welcome to contact the office and I would be happy to provide her with a referral.  3. I discussed with the patient that she should use her bone growth stimulator 4 hours each day.  I recommended that the patient contact the [facility] representative to discuss  fitting and any questions based on the operations of the unit.  The patient verbally demonstrated understanding.  4. I discussed restrictions with the patient which include no lifting over 20-25 pounds maximum, no lifting over 10-15 pounds repetitively, no bending, twisting or avoid sudden  movements of the head or neck.  5. Follow-up in 3 months with Dr. [name] with x-rays of the cervical spine with AP/lateral/flexion/extension views.  The order will be mailed to the patient.    I answered all of the patient's questions to the best of my ability. The patient was encouraged to contact the office in the interim with any questions or concerns.     Thank you for the opportunity of allowing me to participate in the care of Ms. Iyahna Obriant.      Harl Bowie, PA-C   [facility] Neurosurgery APP with Dr. [name]  Clinic Address: [address]   [place], [place] [address]  Office Phone [phone]   Appointments [phone]   Fax [phone]

## 2018-10-24 ENCOUNTER — Encounter (INDEPENDENT_AMBULATORY_CARE_PROVIDER_SITE_OTHER): Payer: Self-pay | Admitting: Physician Assistant

## 2018-10-24 ENCOUNTER — Encounter: Payer: Self-pay | Admitting: Family Nurse Practitioner

## 2018-10-24 ENCOUNTER — Telehealth (INDEPENDENT_AMBULATORY_CARE_PROVIDER_SITE_OTHER): Payer: BC Managed Care – PPO | Admitting: Physician Assistant

## 2018-10-24 DIAGNOSIS — Z981 Arthrodesis status: Secondary | ICD-10-CM

## 2018-11-07 ENCOUNTER — Ambulatory Visit (INDEPENDENT_AMBULATORY_CARE_PROVIDER_SITE_OTHER): Payer: BC Managed Care – PPO | Admitting: Neurological Surgery

## 2018-12-11 ENCOUNTER — Telehealth (INDEPENDENT_AMBULATORY_CARE_PROVIDER_SITE_OTHER): Payer: Self-pay

## 2018-12-11 NOTE — Telephone Encounter (Signed)
Faxed order for XR Cervical Spine to [facility] at fax #[phone] per patient request on 12/11/2018 at 11:45 AM.

## 2018-12-12 ENCOUNTER — Encounter (INDEPENDENT_AMBULATORY_CARE_PROVIDER_SITE_OTHER): Payer: Self-pay | Admitting: Neurological Surgery

## 2019-01-15 ENCOUNTER — Ambulatory Visit (INDEPENDENT_AMBULATORY_CARE_PROVIDER_SITE_OTHER): Payer: Self-pay | Admitting: Cardiology

## 2019-01-22 ENCOUNTER — Telehealth (INDEPENDENT_AMBULATORY_CARE_PROVIDER_SITE_OTHER): Payer: BC Managed Care – PPO | Admitting: Neurological Surgery

## 2019-01-22 DIAGNOSIS — Z981 Arthrodesis status: Secondary | ICD-10-CM

## 2019-01-22 NOTE — Progress Notes (Signed)
NEUROSURGERY ATTENDING - FOLLOW-UP NOTE      Date: 01/23/2019  Patient Name: [name]    Please CC and send a report to:  Patient Care Team:  Dione Plover, NP as PCP - General (Nurse Practitioner)    Due to the COVID-19 virus and guidelines, the patient's follow up appointment was conducted over zoom for the safety of the patient, other patients, and office staff has agreed with having the follow-up appointment via zoom call.    Verbal consent has been obtained from the patient to conduct a zoom visit encounter to minimize exposure to COVID-19: yes    Diagnosis / Chief Complaint:     Status post C5-6, C6-7 ACDF on 09/23/2018    History of Present Illness:     I had the pleasure of seeing [name] in follow-up today regarding the aforementioned chief complaint via zoom.     [name] is a 48 y.o.  right-handedfemalewith a history of type 2 DM and HTN who presentedwith symptoms of myelopathy, as well as bilateral arm pain and burning and numbness. Shewas myelopathic on examination with evidence of weakness in her left hand. Imaging revealedlarge disc herniations at C5-C6 and C6-C7 with spinal cord and bilateral nerve root compression. She underwent C5-6, C6-7 ACDF on 09/23/2018. She was discharged on 09/24/2018. Her last follow up was on 10/24/2018. She returns today with x-rays which will be reviewed below.     Today, the patient reports that she is doing very well.  She rates her pain a 3/10.  At times, she will feel a pulling sensation in her neck.  Overall she has noticed a significant improvement in her symptoms since surgery.  She continues to have numbness in the fifth digits of the hands bilaterally.  She denies any specific focal motor weakness or bowel or bladder dysfunction.  She continues to take Nucynta but has plans to talk to her pain management provider to start weaning off the medication.  She states that she was using her bone growth stimulator regularly but became sick  so she stopped using it.  She denies any clumsiness in the hands.  She reports that her balance has improved significantly.  She denies undergoing any physical therapy.    History was obtained from chart review and the patient.    Review of Systems:     See HPI.     Past Medical History:     Past Medical History:   Diagnosis Date    CHF (congestive heart failure)     diastolic, per pt never had acute episode of CHF    Complication of anesthesia     almost chewed through her tongue and severe chills in past    Coronary artery disease     mild non-obstructive ASCAD on heart cath report 12/2015    Depression     Diabetes mellitus     Fibromyalgia     Hidradenitis     Hydradenitis     Hyperlipidemia     Hypertension     Malignant neoplasm of skin     basal/ squamos    Neuropathy     Sleep apnea     Type 2 diabetes mellitus, controlled        Past Surgical History:     Past Surgical History:   Procedure Laterality Date    BREAST SURGERY  1998    reduction x2    CARDIAC SURGERY  12/2015    L heart cath, diagnosis mild non-obstrutive  ASCAD    CESAREAN SECTION  2008    with tummy tuck    COSMETIC SURGERY  1990s,2000s    thigh 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    DECOMPRESSION, ANTERIOR CERVICAL, FUSION, LEVELS 2 N/A 09/23/2018    Procedure: DECOMPRESSION, ANTERIOR CERVICAL, FUSION, LEVELS 2;  Surgeon: Nanda Quinton, MD;  Location: [facility];  Service: Neurosurgery;  Laterality: N/A;    DISCECTOMY, ANTERIOR CERVICAL, FUSION, LEVELS 2 N/A 09/23/2018    Procedure: C5-C7 ACDF; Use of operating microscope for microsurgical technique, and improved visualization and illumination; Use of morselized autograft for arthrodesis (CPT Code 16109); Use of morselized allograft for arthrodesis (CPT Code 60454); Injection of autologous platelet-rich plasma to fusion site to enhance arthrodesis (CPT Code 0232T); Use of intraoperative neuromonitoring; Use of intra    HAND SURGERY Right     x3, cyst    INCISION  AND DRAINAGE OF WOUND      multiple I&D due to hidradenitis    TONSILLECTOMY         Family History:     Family History   Problem Relation Age of Onset    Hyperlipidemia Mother     Alcohol abuse Father     Hyperlipidemia Father        Social History:     Social History     Socioeconomic History    Marital status: Single     Spouse name: None    Number of children: None    Years of education: None    Highest education level: None   Occupational History    None   Social Engineer, site strain: None    Food insecurity     Worry: None     Inability: None    Transportation needs     Medical: None     Non-medical: None   Tobacco Use    Smoking status: Former Smoker     Packs/day: 1.50     Years: 30.00     Pack years: 45.00     Last attempt to quit: 09/21/2018     Years since quitting: 0.3    Smokeless tobacco: Never Used   Substance and Sexual Activity    Alcohol use: Never     Frequency: Never    Drug use: Never    Sexual activity: None   Lifestyle    Physical activity     Days per week: None     Minutes per session: None    Stress: None   Relationships    Social connections     Talks on phone: None     Gets together: None     Attends religious service: None     Active member of club or organization: None     Attends meetings of clubs or organizations: None     Relationship status: None    Intimate partner violence     Fear of current or ex partner: None     Emotionally abused: None     Physically abused: None     Forced sexual activity: None   Other Topics Concern    None   Social History Narrative    None       Allergies:     Allergies   Allergen Reactions    Wellbutrin [Bupropion] Other (See Comments)     tachycardia    Enablex [Darifenacin] Rash    Gabapentin Rash    Lyrica [Pregabalin] Rash  Nucynta [Tapentadol] Rash     Instant release only; does well with extended release    Vesicare [Solifenacin] Rash       Medications:     Current Outpatient Medications on File Prior to  Visit   Medication Sig Dispense Refill    Blood Glucose Monitoring Suppl (GLUCOCOM BLOOD GLUCOSE MONITOR) Device USE TO CHECK BLOOD SUGARS UP TO THREE TIMES DAILY      Cholecalciferol (VITAMIN D3) 2000 UNIT capsule Take 2,000 Units by mouth nightly      cyclobenzaprine (FLEXERIL) 5 MG tablet Take 1 tablet (5 mg total) by mouth every 6 (six) hours as needed for Muscle spasms 12 tablet 0    DULoxetine (CYMBALTA) 20 MG capsule Take 20 mg by mouth 2 (two) times daily      glipiZIDE XL (GLUCOTROL XL) 5 MG 24 hr tablet Take 5 mg by mouth nightly         glucose blood (ONETOUCH VERIO) test strip Check blood sugars 3 times daily.   Diagnosis: Uncontrolled Type 2 DM      glucose blood test strip 1 each by Other route      hydroCHLOROthiazide (HYDRODIURIL) 25 MG tablet Take 25 mg by mouth nightly         insulin glargine (BASAGLAR KWIKPEN) 100 UNIT/ML injection pen Inject 52 Units into the skin nightly         Insulin Pen Needle (BD PEN NEEDLE NANO U/F) 32G X 4 MM Misc USE AS DIRECTED DAILY      Lancets Misc USE TO CHECK BLOOD SUGARS UP TO THREE TIMES DAILY      LORazepam (ATIVAN) 2 MG tablet Take 2 mg by mouth daily as needed         naloxone (NARCAN) 4 MG/0.1ML nasal spray 1 spray intranasally. If pt does not respond or relapses into respiratory depression call 911. Give additional doses every 2-3 min. 2 each 0    NUCYNTA ER 150 MG Tablet SR 12 hr Take 150 mg by mouth 2 (two) times daily         simvastatin (ZOCOR) 40 MG tablet Take 40 mg by mouth nightly         triazolam (HALCION) 0.125 MG tablet Take 0.125 mg by mouth nightly as needed          No current facility-administered medications on file prior to visit.        Vital Signs:     Due to the patient's preference and  COVID 19 CDC recommendations, the vital signs are deferred at this time.     Physical Exam:     General: No acute distress, cooperative with examination  Psychologic: Affect appropriate, judgment and insight consistent with situation, no  delusions or hallucinations  Skin: No obvious lesions or scars  Eyes: Sclerae anicteric, no conjunctival injection  ENT: No visible otorrhea, no rhinorrhea, trachea midline  Head: Normocephalic  Musculoskeletal: Full ROM, no apparent atrophy  Pulmonary: Normal respiratory effort, no audible wheezing  Cardiovascular: No apparent pedal edema  Abdominal: No apparent distention    Neuro exam:   Awake, alert, orientedx3  Speech clear and fluent  Attention span normal  EOMI  Face symmetric  Hearing intact  Tongue midline  Shoulder shrug strong bilaterally  Motor:  Moving all extremities.     Gait normal   able to tandem gait  able to toe and heel walk    Incision: clean, dry and intact. There is no edema, or erythema.  Labs:     Lab Results   Component Value Date    WBC 13.03 (H) 09/24/2018    HGB 12.5 09/24/2018    HCT 37.4 09/24/2018    MCV 93.0 09/24/2018    PLT 238 09/24/2018     Lab Results   Component Value Date    NA 138 09/24/2018    K 3.5 09/24/2018    CL 101 09/24/2018    CO2 27 09/24/2018     Lab Results   Component Value Date    INR 1.0 09/24/2018    INR 0.9 09/23/2018    INR 0.9 09/22/2018    PT 12.9 09/24/2018    PT 12.3 (L) 09/23/2018    PT 12.5 (L) 09/22/2018     Lab Results   Component Value Date    BUN 9.0 09/24/2018     Lab Results   Component Value Date    CREAT 0.7 09/24/2018       Imaging:     I reviewed the patient's imaging myself. My own interpretation of the x-ray of the cervical spine is that it shows excellent placement of the instrumentation with no evidence of failure or pseudoarthrosis.      Assessment:     49 y.o. female status post C5-C6, C6-C7 ACDF on 09/23/2018.  She is doing very well and the vascular team her preoperative symptoms have resolved following the surgery.  Imaging reveals excellent placement of the hardware with no evidence of complications.      Plan:     I had an extensive discussion with the patient regarding the condition. I showed her the imaging, and went over its  findings in detail. I explained to the patient that I am very pleased with the fact her symptoms have significant improved after the surgery.  I counseled her extensively about quitting smoking and importance of that for the success of her fusion on the long run.  We also went over general spine precautions including no heavy lifting or neck twisting or severe neck bending.  I will see her again in 8 months with CT of the cervical spine without contrast in order to reevaluate her condition and reassess her fusion construct.  All questions were answered.    I spent over 30 minutes with the patient today and more than 50% of this time was spent in counseling and coordinating the patient's care.    Thank you for the opportunity of allowing me to participate in the care of Ms. Cammy Sanjurjo.      Hoyle Sauer. Elsye Mccollister, MD  Minimally Invasive and Complex Spine Surgery & General Neurosurgery  Department of Neurosciences   [facility] Neuro[facility]  Assistant Professor of Neurosurgery   [facility] [place] Address: [address], [address]   [place], [place] [address]  Office Phone [phone]   Fax [phone]

## 2019-01-23 ENCOUNTER — Encounter (INDEPENDENT_AMBULATORY_CARE_PROVIDER_SITE_OTHER): Payer: Self-pay | Admitting: Neurological Surgery

## 2019-01-26 ENCOUNTER — Other Ambulatory Visit (INDEPENDENT_AMBULATORY_CARE_PROVIDER_SITE_OTHER): Payer: Self-pay

## 2019-01-26 DIAGNOSIS — Z981 Arthrodesis status: Secondary | ICD-10-CM

## 2019-02-01 ENCOUNTER — Other Ambulatory Visit: Payer: Self-pay | Admitting: Cardiology

## 2019-02-01 DIAGNOSIS — I6529 Occlusion and stenosis of unspecified carotid artery: Secondary | ICD-10-CM

## 2019-02-19 ENCOUNTER — Ambulatory Visit
Admission: RE | Admit: 2019-02-19 | Discharge: 2019-02-19 | Disposition: A | Discharge status: Home / Self Care | Payer: BC Managed Care – PPO | Source: Ambulatory Visit | Attending: Cardiology | Admitting: Cardiology

## 2019-02-19 ENCOUNTER — Ambulatory Visit: Payer: BC Managed Care – PPO

## 2019-02-19 ENCOUNTER — Ambulatory Visit (INDEPENDENT_AMBULATORY_CARE_PROVIDER_SITE_OTHER): Payer: Self-pay

## 2019-02-19 DIAGNOSIS — I6529 Occlusion and stenosis of unspecified carotid artery: Secondary | ICD-10-CM

## 2019-02-19 DIAGNOSIS — I6523 Occlusion and stenosis of bilateral carotid arteries: Secondary | ICD-10-CM | POA: Insufficient documentation

## 2019-03-09 ENCOUNTER — Encounter (INDEPENDENT_AMBULATORY_CARE_PROVIDER_SITE_OTHER): Payer: Self-pay

## 2019-03-09 NOTE — Progress Notes (Signed)
Patient called to report she has a right earlobe cyst that is infected. Patient is being followed by her PCP for this and has already started antibiotics on 03/08/2019.    Patient stated she is having some neck stiffness. Her pain management doctor showed her different neck stretches. Patient declined physical therapy referral due to pandemic. Patient denies any arm weakness bilaterally and new or worsening symptoms. Patient aware of follow up appointment with Dr.Fanous.

## 2019-09-28 ENCOUNTER — Ambulatory Visit (INDEPENDENT_AMBULATORY_CARE_PROVIDER_SITE_OTHER): Payer: BC Managed Care – PPO | Admitting: Neurological Surgery

## 2019-10-12 ENCOUNTER — Ambulatory Visit (INDEPENDENT_AMBULATORY_CARE_PROVIDER_SITE_OTHER): Payer: BC Managed Care – PPO | Admitting: Neurological Surgery

## 2019-10-18 ENCOUNTER — Telehealth (INDEPENDENT_AMBULATORY_CARE_PROVIDER_SITE_OTHER): Payer: Self-pay | Admitting: Neurological Surgery

## 2019-10-18 ENCOUNTER — Other Ambulatory Visit (INDEPENDENT_AMBULATORY_CARE_PROVIDER_SITE_OTHER): Payer: Self-pay | Admitting: Physician Assistant

## 2019-10-18 DIAGNOSIS — Z981 Arthrodesis status: Secondary | ICD-10-CM

## 2019-10-18 NOTE — Telephone Encounter (Signed)
Spoke with PT at 11:15 am 10/18/19. PT called to make complaint/inform of imaging order error. Stated that it was 2nd time that Order date was incorrect. I Informed PT that it was not incorrect but needed to be updated considering she gets one CT done yearly. CT order will be updated today per [facility]. PT informed of that as well.

## 2019-10-19 ENCOUNTER — Telehealth (INDEPENDENT_AMBULATORY_CARE_PROVIDER_SITE_OTHER): Payer: Self-pay

## 2019-10-19 NOTE — Telephone Encounter (Signed)
CT Cervical Spine WO Contrast order faxed to [facility] (fax) [phone] as requested.

## 2019-10-22 ENCOUNTER — Encounter (INDEPENDENT_AMBULATORY_CARE_PROVIDER_SITE_OTHER): Payer: Self-pay

## 2019-10-22 ENCOUNTER — Ambulatory Visit
Admission: RE | Admit: 2019-10-22 | Discharge: 2019-10-22 | Disposition: A | Discharge status: Home / Self Care | Payer: Self-pay | Source: Ambulatory Visit

## 2019-10-22 ENCOUNTER — Ambulatory Visit (INDEPENDENT_AMBULATORY_CARE_PROVIDER_SITE_OTHER): Payer: BC Managed Care – PPO | Admitting: Neurological Surgery

## 2019-10-22 ENCOUNTER — Other Ambulatory Visit: Payer: Self-pay

## 2019-10-22 ENCOUNTER — Encounter (INDEPENDENT_AMBULATORY_CARE_PROVIDER_SITE_OTHER): Payer: Self-pay | Admitting: Neurological Surgery

## 2019-10-22 VITALS — BP 123/85 | HR 94

## 2019-10-22 DIAGNOSIS — Z981 Arthrodesis status: Secondary | ICD-10-CM

## 2019-10-22 NOTE — Progress Notes (Signed)
NEUROSURGERY ATTENDING - FOLLOW-UP NOTE      Date: 10/22/2019  Patient Name: [name],[name]    Please CC and send a report to:  Patient Care Team:  Dione Plover, NP as PCP - General (Nurse Practitioner)    Diagnosis / Chief Complaint:     Status post C5-6, C6-7 ACDF on 09/23/2018    History of Present Illness:     I had the pleasure of seeing [name] in follow-up today regarding the aforementioned chief complaint.     [name] is a 49 y.o.   right-handedfemalewith a history of type 2 DM and HTN who presentedwith symptoms of myelopathy, as well as bilateral arm pain and burning and numbness. Shewas myelopathic on examination with evidence of weakness in her left hand. Imaging revealedlarge disc herniations at C5-C6 and C6-C7 with spinal cord and bilateral nerve root compression.She underwent C5-6, C6-7 ACDF on 09/23/2018. She was discharged on 09/24/2018. Her last follow up was on 01/22/2019.  She returns today with a CT of the cervical spine.    Today, the patient rates her pain a 2/10 located in the left deltoid.  Overall she has noticed a significant improvement in her symptoms since surgery.  She does note some clumsiness in the hands and that her toes and feet are numb.  Over the last 2 months she has also complained of some balance issues on and off but denies any falls.  She continues to have residual numbness in the fourth and fifth digit of the left hand.  She denies any specific focal motor weakness or bowel/bladder dysfunction.  She admits to only wearing her bone growth stimulator for 1 month.  She continues to take Nucynta, tizanidine and gabapentin.    History was obtained from chart review and the patient.    I, Harl Bowie, PA-C, was acting as Neurosurgeon for provider Jonathon Jordan, MD Janetta Hora, on the current patient's note.    Review of Systems:     See HPI.           Color Code  Yellow - Numbness sensation  Purple - Pins & Needles sensation  Red     - Burning sensation  Blue     - Aching pain  Green Lambert Mody and Stabbing pain  [employer] - Other    Past Medical History:     Past Medical History:   Diagnosis Date    CHF (congestive heart failure)     diastolic, per pt never had acute episode of CHF    Complication of anesthesia     almost chewed through her tongue and severe chills in past    Coronary artery disease     mild non-obstructive ASCAD on heart cath report 12/2015    Depression     Diabetes mellitus     Fibromyalgia     Hidradenitis     Hydradenitis     Hyperlipidemia     Hypertension     Malignant neoplasm of skin     basal/ squamos    Neuropathy     Sleep apnea     Type 2 diabetes mellitus, controlled        Past Surgical History:     Past Surgical History:   Procedure Laterality Date    BREAST SURGERY  1998    reduction x2    CARDIAC SURGERY  12/2015    L heart cath, diagnosis mild non-obstrutive ASCAD    CESAREAN SECTION  2008    with tummy  tuck    COSMETIC SURGERY  1990s,2000s    thigh 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    DECOMPRESSION, ANTERIOR CERVICAL, FUSION, LEVELS 2 N/A 09/23/2018    Procedure: DECOMPRESSION, ANTERIOR CERVICAL, FUSION, LEVELS 2;  Surgeon: Nanda Quinton, MD;  Location: [facility];  Service: Neurosurgery;  Laterality: N/A;    DISCECTOMY, ANTERIOR CERVICAL, FUSION, LEVELS 2 N/A 09/23/2018    Procedure: C5-C7 ACDF; Use of operating microscope for microsurgical technique, and improved visualization and illumination; Use of morselized autograft for arthrodesis (CPT Code 16109); Use of morselized allograft for arthrodesis (CPT Code 60454); Injection of autologous platelet-rich plasma to fusion site to enhance arthrodesis (CPT Code 0232T); Use of intraoperative neuromonitoring; Use of intra    HAND SURGERY Right     x3, cyst    INCISION AND DRAINAGE OF WOUND      multiple I&D due to hidradenitis    TONSILLECTOMY         Family History:     Family History   Problem Relation Age of Onset    Hyperlipidemia Mother     Alcohol abuse  Father     Hyperlipidemia Father        Social History:     Social History     Socioeconomic History    Marital status: Single     Spouse name: Not on file    Number of children: Not on file    Years of education: Not on file    Highest education level: Not on file   Occupational History    Not on file   Tobacco Use    Smoking status: Former Smoker     Packs/day: 1.50     Years: 30.00     Pack years: 45.00     Quit date: 09/21/2018     Years since quitting: 1.0    Smokeless tobacco: Never Used   Substance and Sexual Activity    Alcohol use: Never    Drug use: Never    Sexual activity: Not on file   Other Topics Concern    Not on file   Social History Narrative    Not on file     Social Determinants of Health     Financial Resource Strain:     Difficulty of Paying Living Expenses:    Food Insecurity:     Worried About Programme researcher, broadcasting/film/video in the Last Year:     Barista in the Last Year:    Transportation Needs:     Freight forwarder (Medical):     Lack of Transportation (Non-Medical):    Physical Activity:     Days of Exercise per Week:     Minutes of Exercise per Session:    Stress:     Feeling of Stress :    Social Connections:     Frequency of Communication with Friends and Family:     Frequency of Social Gatherings with Friends and Family:     Attends Religious Services:     Active Member of Clubs or Organizations:     Attends Banker Meetings:     Marital Status:    Intimate Partner Violence:     Fear of Current or Ex-Partner:     Emotionally Abused:     Physically Abused:     Sexually Abused:        Allergies:     Allergies   Allergen Reactions    Wellbutrin [Bupropion]  Other (See Comments)     tachycardia    Enablex [Darifenacin] Rash    Gabapentin Rash    Lyrica [Pregabalin] Rash    Nucynta [Tapentadol] Rash     Instant release only; does well with extended release    Vesicare [Solifenacin] Rash       Medications:     Current Outpatient Medications  on File Prior to Visit   Medication Sig Dispense Refill    Blood Glucose Monitoring Suppl (GLUCOCOM BLOOD GLUCOSE MONITOR) Device USE TO CHECK BLOOD SUGARS UP TO THREE TIMES DAILY      Cholecalciferol (VITAMIN D3) 2000 UNIT capsule Take 2,000 Units by mouth nightly      cyclobenzaprine (FLEXERIL) 5 MG tablet Take 1 tablet (5 mg total) by mouth every 6 (six) hours as needed for Muscle spasms 12 tablet 0    DULoxetine (CYMBALTA) 20 MG capsule Take 20 mg by mouth 2 (two) times daily      glipiZIDE XL (GLUCOTROL XL) 5 MG 24 hr tablet Take 5 mg by mouth nightly         glucose blood (ONETOUCH VERIO) test strip Check blood sugars 3 times daily.   Diagnosis: Uncontrolled Type 2 DM      glucose blood test strip 1 each by Other route      hydroCHLOROthiazide (HYDRODIURIL) 25 MG tablet Take 25 mg by mouth nightly         insulin glargine (BASAGLAR KWIKPEN) 100 UNIT/ML injection pen Inject 52 Units into the skin nightly         Insulin Pen Needle (BD PEN NEEDLE NANO U/F) 32G X 4 MM Misc USE AS DIRECTED DAILY      Lancets Misc USE TO CHECK BLOOD SUGARS UP TO THREE TIMES DAILY      LORazepam (ATIVAN) 2 MG tablet Take 2 mg by mouth daily as needed         naloxone (NARCAN) 4 MG/0.1ML nasal spray 1 spray intranasally. If pt does not respond or relapses into respiratory depression call 911. Give additional doses every 2-3 min. 2 each 0    NUCYNTA ER 150 MG Tablet SR 12 hr Take 150 mg by mouth 2 (two) times daily         simvastatin (ZOCOR) 40 MG tablet Take 40 mg by mouth nightly         triazolam (HALCION) 0.125 MG tablet Take 0.125 mg by mouth nightly as needed          No current facility-administered medications on file prior to visit.       Vital Signs:     There were no vitals filed for this visit.    Physical Exam:     General: No acute distress, cooperative with examination  Psychologic: Affect appropriate, judgment and insight consistent with situation, no delusions or hallucinations  Skin: Warm, dry, no  obvious lesions or scars  Eyes: Sclerae anicteric, no conjunctival injection  ENT: No visible otorrhea, no rhinorrhea, trachea midline  Head: Normocephalic  Neck: No palpable masses  Musculoskeletal: Full ROM, normal muscle tone, no atrophy  Pulmonary: Normal respiratory effort, no audible wheezing  Cardiovascular: No pedal edema, pulses 2+ in bilat lower extremities  Abdominal: Non-tender to palpation, non-distended, no organomegaly, no palpable masses    Neuro exam:   Awake, alert, orientedx3  Speech clear and fluent  Attention span normal  PERRL, EOMI  Facial sensation intact  Face symmetric  Hearing intact  Tongue midline  Shoulder shrug strong bilaterally  Motor:   Arms:      Deltoid  Bicep Tricep Grip IO   Right 5 5 5 5 5    Left  5 5 5 5 5        Legs:      HF KE KF DF PF EHL   Right 5 5 5 5 5 5    Left  5 5 5 5 5 5      No pronator drift  No dysmetria  Light touch and pinprick intact in all 4 extremities except decreased to light touch in the fourth and fifth digits of the left hand and in the dorsum of the feet bilaterally.  DTRs:      Biceps Triceps Brachiorad Patellar Ankle   Right 2+ 2+ 2+ 2+ 2+   Left  2+ 2+ 2+ 2+ 2+     No Hoffmann's sign bilaterally  No Clonus bilaterally  Gait normal  able to tandem gait  able to toe and heel walk    Incision: Well-healed.    Labs:     Lab Results   Component Value Date    WBC 13.03 (H) 09/24/2018    HGB 12.5 09/24/2018    HCT 37.4 09/24/2018    MCV 93.0 09/24/2018    PLT 238 09/24/2018     Lab Results   Component Value Date    NA 138 09/24/2018    K 3.5 09/24/2018    CL 101 09/24/2018    CO2 27 09/24/2018     Lab Results   Component Value Date    INR 1.0 09/24/2018    INR 0.9 09/23/2018    INR 0.9 09/22/2018    PT 12.9 09/24/2018    PT 12.3 (L) 09/23/2018    PT 12.5 (L) 09/22/2018     Lab Results   Component Value Date    BUN 9.0 09/24/2018     Lab Results   Component Value Date    CREAT 0.7 09/24/2018       Imaging:     I reviewed the patient's imaging myself. My own  interpretation of the CT of the cervical spine without contrast is that it shows excellent placement of instrumentation with no evidence of failure or pseudoarthrosis.      Assessment:     49 y.o. female status post C5-C7 ACDF on 09/23/2018.  The patient is doing extremely well, and her preoperative myelopathy, pain and weakness have completely resolved.  She is now largely nonfocal examination with the exception of mild decreased light touch sensation in the left fingers.      Plan:     I had an extensive discussion with the patient regarding the condition. I showed her the imaging, and went over its findings in detail. I explained to the patient that I am very pleased with the results of the surgery and was apparent her symptoms have significant improved thereafter.  I extensively counseled the patient regarding quitting smoking.  We went over general spine precautions going no severe bending or twisting or heavy lifting. I told the patient that there is no need for further scheduled follow up with me, but that I would be more than happy to see her shall any questions or concerns arise. All questions were answered.    I spent a total time of over 30 minutes today, spent in patient interview, patient examination, review and discussion of imaging, counseling, coordination of patient care, and review and completion of medical records.    Thank you for  the opportunity of allowing me to participate in the care of Ms. Samella Sisko.    I, Jonathon Jordan, MD Janetta Hora, personally performed the services documented. Harl Bowie, PA-C, is scribing for me on the current patient's note. This note and the patient instructions accurately reflect work and decisions made by me, Jonathon Jordan, MD Janetta Hora.    Hoyle Sauer. Teniyah Seivert, MD FAANS  Section Chief of Neurological Surgery   [facility]  Minimally Invasive and Complex Spine Surgery & General Neurosurgery  Department of Neurosciences   [facility]  Neurosurgery  Assistant Professor of Neurosurgery   [facility]  Address: [address]   [place], [place] [address]  Phone [phone]   Fax [phone]

## 2019-12-24 ENCOUNTER — Encounter (INDEPENDENT_AMBULATORY_CARE_PROVIDER_SITE_OTHER): Payer: Self-pay | Admitting: Obstetrics & Gynecology

## 2019-12-24 ENCOUNTER — Ambulatory Visit (INDEPENDENT_AMBULATORY_CARE_PROVIDER_SITE_OTHER): Payer: BC Managed Care – PPO | Admitting: Obstetrics & Gynecology

## 2019-12-24 VITALS — BP 115/80 | HR 91 | Temp 97.3°F | Ht 63.5 in | Wt 191.6 lb

## 2019-12-24 DIAGNOSIS — R350 Frequency of micturition: Secondary | ICD-10-CM

## 2019-12-24 DIAGNOSIS — Z01419 Encounter for gynecological examination (general) (routine) without abnormal findings: Secondary | ICD-10-CM

## 2019-12-24 DIAGNOSIS — N763 Subacute and chronic vulvitis: Secondary | ICD-10-CM

## 2019-12-24 MED ORDER — NYSTATIN-TRIAMCINOLONE 100000-0.1 UNIT/GM-% EX OINT
TOPICAL_OINTMENT | Freq: Two times a day (BID) | CUTANEOUS | 1 refills | Status: DC
Start: 2019-12-24 — End: 2020-07-22

## 2019-12-24 NOTE — Progress Notes (Signed)
Chief Complaint   Patient presents with    Annual Exam     NP- due for Pap         Subjective:      [name] is a 49 y.o. female who presents for an annual exam.     Additional issues she wants to discuss: needs mammo, some urinary frequency and urgency; has some vulvar itchiness due to her diabetes meds and wants something topical.      Primary care physician Dione Plover, NP    Review of Systems:     Review of Systems   Constitutional: Negative.    HENT: Negative.    Eyes: Negative.    Respiratory: Negative for cough, shortness of breath and wheezing.    Cardiovascular: Negative for chest pain and palpitations.   Gastrointestinal: Negative for abdominal pain, constipation, diarrhea, nausea and vomiting.   Genitourinary: Positive for frequency. Negative for dysuria and urgency.        Vulvar irritation   Musculoskeletal: Negative for back pain and joint pain.   Skin: Negative.    Neurological: Negative for dizziness and headaches.   Psychiatric/Behavioral: Negative for depression. The patient is not nervous/anxious.          Past Ob/Gyn History:     Menstrual History:  No LMP recorded. Patient is postmenopausal.  Cycles: absent    Gyn screening tests:  Last pap- 2 yrs wnl  Contraception- na   HPV vaccine- na  Last mammogram- 1 yr wnl   Last Dexa- na    OB History   Gravida Para Term Preterm AB Living   1 1 1  0 0 2   SAB TAB Ectopic Multiple Live Births   0 0 0 1 0      # Outcome Date GA Lbr Len/2nd Weight Sex Delivery Anes PTL Lv   1A Term            1B Term 2008                 Past Medical, Surgical, Family, Medications, Social History, Medications:       Past Medical History:   Diagnosis Date    CHF (congestive heart failure)     diastolic, per pt never had acute episode of CHF    Complication of anesthesia     almost chewed through her tongue and severe chills in past    Coronary artery disease     mild non-obstructive ASCAD on heart cath report 12/2015    Depression     Diabetes mellitus      Fibromyalgia     Hidradenitis     Hydradenitis     Hyperlipidemia     Hypertension     Malignant neoplasm of skin     basal/ squamos    Neuropathy     Sleep apnea     Type 2 diabetes mellitus, controlled        Past Surgical History:   Procedure Laterality Date    BREAST SURGERY  1998    reduction x2    CARDIAC SURGERY  12/2015    L heart cath, diagnosis mild non-obstrutive ASCAD    CESAREAN SECTION  2008    with tummy tuck    COSMETIC SURGERY  1990s,2000s    thigh 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    DECOMPRESSION, ANTERIOR CERVICAL, FUSION, LEVELS 2 N/A 09/23/2018    Procedure: DECOMPRESSION, ANTERIOR CERVICAL, FUSION, LEVELS 2;  Surgeon: Nanda Quinton, MD;  Location: [facility];  Service: Neurosurgery;  Laterality: N/A;    DISCECTOMY, ANTERIOR CERVICAL, FUSION, LEVELS 2 N/A 09/23/2018    Procedure: C5-C7 ACDF; Use of operating microscope for microsurgical technique, and improved visualization and illumination; Use of morselized autograft for arthrodesis (CPT Code 16109); Use of morselized allograft for arthrodesis (CPT Code 60454); Injection of autologous platelet-rich plasma to fusion site to enhance arthrodesis (CPT Code 0232T); Use of intraoperative neuromonitoring; Use of intra    HAND SURGERY Right     x3, cyst    INCISION AND DRAINAGE OF WOUND      multiple I&D due to hidradenitis    TONSILLECTOMY         Family History   Problem Relation Age of Onset    Hyperlipidemia Mother     Alcohol abuse Father     Hyperlipidemia Father        Social History     Socioeconomic History    Marital status: Single     Spouse name: Not on file    Number of children: Not on file    Years of education: Not on file    Highest education level: Not on file   Occupational History    Not on file   Tobacco Use    Smoking status: Light Tobacco Smoker     Packs/day: 1.50     Years: 30.00     Pack years: 45.00     Last attempt to quit: 09/21/2018     Years since quitting: 1.2    Smokeless tobacco:  Never Used   Vaping Use    Vaping Use: Never used   Substance and Sexual Activity    Alcohol use: Never    Drug use: Never    Sexual activity: Not Currently   Other Topics Concern    Not on file   Social History Narrative    Not on file     Social Determinants of Health     Financial Resource Strain:     Difficulty of Paying Living Expenses:    Food Insecurity:     Worried About Programme researcher, broadcasting/film/video in the Last Year:     Barista in the Last Year:    Transportation Needs:     Freight forwarder (Medical):     Lack of Transportation (Non-Medical):    Physical Activity:     Days of Exercise per Week:     Minutes of Exercise per Session:    Stress:     Feeling of Stress :    Social Connections:     Frequency of Communication with Friends and Family:     Frequency of Social Gatherings with Friends and Family:     Attends Religious Services:     Active Member of Clubs or Organizations:     Attends Banker Meetings:     Marital Status:    Intimate Partner Violence:     Fear of Current or Ex-Partner:     Emotionally Abused:     Physically Abused:     Sexually Abused:      The patient participates in regular exercise: no.   The patient reports that there is not domestic violence in her life.       Current Outpatient Medications:     aspirin 81 MG EC tablet, Take 81 mg by mouth daily, Disp: , Rfl:     Blood Glucose Monitoring Suppl (GLUCOCOM BLOOD GLUCOSE MONITOR) Device, USE TO  CHECK BLOOD SUGARS UP TO THREE TIMES DAILY, Disp: , Rfl:     Cholecalciferol (VITAMIN D3) 2000 UNIT capsule, Take 2,000 Units by mouth nightly, Disp: , Rfl:     cloNIDine (CATAPRES) 0.1 MG tablet, Take 0.1 mg by mouth as needed, Disp: , Rfl:     DULoxetine (CYMBALTA) 20 MG capsule, Take 20 mg by mouth 2 (two) times daily, Disp: , Rfl:     empagliflozin (Jardiance) 25 MG tablet, Take 25 mg by mouth daily, Disp: , Rfl:     glipiZIDE XL (GLUCOTROL XL) 5 MG 24 hr tablet, Take 5 mg by mouth nightly  ,  Disp: , Rfl:     glucose blood (ONETOUCH VERIO) test strip, Check blood sugars 3 times daily.   Diagnosis: Uncontrolled Type 2 DM, Disp: , Rfl:     glucose blood test strip, 1 each by Other route, Disp: , Rfl:     hydroCHLOROthiazide (HYDRODIURIL) 25 MG tablet, Take 12.5 mg by mouth nightly  , Disp: , Rfl:     insulin glargine (BASAGLAR KWIKPEN) 100 UNIT/ML injection pen, Inject 52 Units into the skin nightly  , Disp: , Rfl:     Insulin Pen Needle (BD PEN NEEDLE NANO U/F) 32G X 4 MM Misc, USE AS DIRECTED DAILY, Disp: , Rfl:     isosorbide mononitrate (IMDUR) 30 MG 24 hr tablet, Take 30 mg by mouth daily, Disp: , Rfl:     Lancets Misc, USE TO CHECK BLOOD SUGARS UP TO THREE TIMES DAILY, Disp: , Rfl:     LORazepam (ATIVAN) 2 MG tablet, Take 2 mg by mouth daily as needed  , Disp: , Rfl:     metFORMIN (GLUCOPHAGE-XR) 500 MG 24 hr tablet, Take 1,000 mg by mouth 2 (two) times daily, Disp: , Rfl:     metoprolol succinate (TOPROL-XL) 100 MG 24 hr tablet, 100 mg daily, Disp: , Rfl:     naloxone (NARCAN) 4 MG/0.1ML nasal spray, 1 spray intranasally. If pt does not respond or relapses into respiratory depression call 911. Give additional doses every 2-3 min., Disp: 2 each, Rfl: 0    NUCYNTA ER 150 MG Tablet SR 12 hr, Take 100 mg by mouth 2 (two) times daily  , Disp: , Rfl:     nystatin-triamcinolone (MYCOLOG) ointment, Apply topically 2 (two) times daily, Disp: 60 g, Rfl: 1    Semaglutide,0.25 or 0.5MG /DOS, (Ozempic, 0.25 or 0.5 MG/DOSE,) 2 MG/1.5ML Solution Pen-injector, Ozempic 0.25 mg or 0.5 mg (2 mg/1.5 mL) subcutaneous pen injector, Disp: , Rfl:     simvastatin (ZOCOR) 40 MG tablet, Take 40 mg by mouth nightly  , Disp: , Rfl:     tapentadol HCl (Nucynta) 50 MG Tab tablet, Take 50 mg by mouth every 6 (six) hours Up to 3 times daily, Disp: , Rfl:     triazolam (HALCION) 0.125 MG tablet, Take 0.125 mg by mouth nightly as needed  , Disp: , Rfl:       Physical Exam:     Vitals:    12/24/19 0837   BP: 115/80    Pulse: 91   Temp: 97.3 F (36.3 C)     Weight Monitoring 09/21/2018 09/21/2018 09/22/2018 09/23/2018 09/24/2018 01/15/2019 12/24/2019   Height 157.5 cm 157.5 cm - - - 160 cm 161.3 cm   Height Method - - - - - - -   Weight 92.987 kg 96.616 kg 98.431 kg 98.4 kg 94.394 kg 90.538 kg 86.909 kg   Weight Method - Bed  Scale Bed Scale Standing Scale - - -   BMI (calculated) 37.6 kg/m2 39 kg/m2 - - - 35.4 kg/m2 33.5 kg/m2         General Appearance:  Alert, cooperative, no distress, appears stated age   Psychiatric: Normal mood, affect, judgment, and insight.  Oriented to time, place, and person    Eyes: Sclera Nonicteric    Neck: Appears normal.  Thyroid: not enlarged, symmetric, no tenderness/mass/nodules  Trachea midline, no adenopathy.     Respiratory: Normal respiratory effort. No intercostal retractions or use of accessory muscles.     Cardiovascular:  Regular rate and rhythm by palpation.  Lower extremities- no varicosities, calf pain or edema    Breasts:  Appear normal, symmetric, no retractions or dimpling with raising or lowering arms, no nipple or skin changes.  No masses felt in either breast or axilla.  Nontender to palpation.  No nipple discharge   Abdomen:   Soft, non-tender, no masses or hernias appreciated. No organomegaly appericated   Pelvic: Normal external female genitalia.    VULVA: normal appearing vulva with no masses, tenderness or lesions, normal clitoris, labia majora and minora.   VAGINA: normal appearing vagina with normal color and discharge, no lesions. Mild atrophy  CERVIX: normal appearing cervix with normal mucous and discharge.  No lesions.  UTERUS: uterus is normal size, shape, consistency and nontender, anteverted  BLADDER: non tender bladder and urethra.  Ureteral opening appears normal,    ADNEXA:  nontender and no masses.  RECTAL: normal external, no hemorrhoids  PELVIC FLOOR SUPPORT: wnl   Skin: Skin color, texture, turgor normal, no rashes or lesions; hidradenitis suppurativa scarring    Lymph nodes: Cervical, supraclavicular, and axillary nodes normal   Neurologic: Normal CN II-XII. Grossly intact.           Assessment/Plan:     49 y.o. G1P1002 for well woman exam   Pap with HR HPV collected today.    Dicussed monthly self breast exams and yearly mammogram recommended after 40. Referral for annual mammogram given today  Importance of healthy diet, exercise and adequate Ca and Vit D intake discussed      Additional issues discussed/discovered:  Urogyn referral made    1. Encounter for well woman exam  Start Mycolog cream  See urogyn.         Return to office in 1 year for annual exam or PRN.

## 2020-01-01 LAB — THINPREP(R) & HPV MRNA E6/E7 RFLX HPV 16,18/45: HPV RNA, High Risk, E6-E7, TMA: NOT DETECTED

## 2020-01-15 ENCOUNTER — Telehealth (INDEPENDENT_AMBULATORY_CARE_PROVIDER_SITE_OTHER): Payer: Self-pay | Admitting: Obstetrics & Gynecology

## 2020-01-15 NOTE — Telephone Encounter (Signed)
Left voicemail for pt to check mychart for pap results or call back.

## 2020-02-01 ENCOUNTER — Ambulatory Visit
Admission: RE | Admit: 2020-02-01 | Discharge: 2020-02-01 | Disposition: A | Discharge status: Home / Self Care | Payer: BC Managed Care – PPO | Source: Ambulatory Visit | Attending: Obstetrics & Gynecology | Admitting: Obstetrics & Gynecology

## 2020-02-01 ENCOUNTER — Other Ambulatory Visit (INDEPENDENT_AMBULATORY_CARE_PROVIDER_SITE_OTHER): Payer: Self-pay | Admitting: Obstetrics & Gynecology

## 2020-02-01 DIAGNOSIS — Z01419 Encounter for gynecological examination (general) (routine) without abnormal findings: Secondary | ICD-10-CM | POA: Insufficient documentation

## 2020-02-04 ENCOUNTER — Other Ambulatory Visit: Payer: Self-pay

## 2020-02-04 ENCOUNTER — Ambulatory Visit
Admission: RE | Admit: 2020-02-04 | Discharge: 2020-02-04 | Disposition: A | Discharge status: Home / Self Care | Payer: Self-pay | Source: Ambulatory Visit

## 2020-02-04 DIAGNOSIS — R52 Pain, unspecified: Secondary | ICD-10-CM

## 2020-02-11 ENCOUNTER — Encounter (INDEPENDENT_AMBULATORY_CARE_PROVIDER_SITE_OTHER): Payer: Self-pay

## 2020-03-13 ENCOUNTER — Ambulatory Visit: Payer: BC Managed Care – PPO | Admitting: Obstetrics & Gynecology

## 2020-03-20 ENCOUNTER — Encounter: Payer: Self-pay | Admitting: Obstetrics & Gynecology

## 2020-03-20 ENCOUNTER — Ambulatory Visit (INDEPENDENT_AMBULATORY_CARE_PROVIDER_SITE_OTHER): Payer: BC Managed Care – PPO | Admitting: Obstetrics & Gynecology

## 2020-03-20 VITALS — BP 104/75 | HR 94 | Resp 18 | Wt 191.0 lb

## 2020-03-20 DIAGNOSIS — N9489 Other specified conditions associated with female genital organs and menstrual cycle: Secondary | ICD-10-CM

## 2020-03-20 DIAGNOSIS — N398 Other specified disorders of urinary system: Secondary | ICD-10-CM

## 2020-03-20 DIAGNOSIS — M7918 Myalgia, other site: Secondary | ICD-10-CM

## 2020-03-20 DIAGNOSIS — R3915 Urgency of urination: Secondary | ICD-10-CM

## 2020-03-20 DIAGNOSIS — R351 Nocturia: Secondary | ICD-10-CM

## 2020-03-20 NOTE — Progress Notes (Signed)
[facility]   New Patient History and Physical Examination       Primary Care Provider: Dione Plover, NP      Chief Complaint:   Chief Complaint   Patient presents with    Urinary Frequency     states she feels she has to go to bathroom all the time. mostly when sleeping. denies any leakage or accidents         HPI:   [name] is a 49 y.o. female [id number] with complex medical issue of HTN, DM, HLD, CAD, CHF  who presents with urinary urgency at night.  This keeps her up at night.     She states that she has a 20 year history of urinary urgency.  She could not sleep.  She saw a urologist.  She did a test and was told that she had scarring.  She has to valsalva void.  She states that she tried vesciare and enablex.  She states that an ob/gyn gave her a bowel spasm medication which helped her.  She states that her symptoms stopped and she had no issues.  She states that 4 years ago this started again. She uses boil ease     When she was on lorazepam it helped.  When she is stressed or she cuts back on her meds from her neck she has issues.         Urinary Complaints:    Daytime voids: every 1-2 hour   Night:goes 2-4 before asleep..... Then might wake up 2-3 times.  She does wear her sleep apnea machine.  She takes a few nerve medications. Takes night meds  At 5 pm.     Stress Urinary Incontinence: denies   Urgency Urinary Incontinence: denies    Pads:    Incomplete Bladder Emptying: denies   Hesitancy: +++   Valsalva voids:  +++     Bowel Complaints:   Constipation:+++ No miralax      Fecal Incontinence: denies    Anal Incontinence: denies     Prolapse Complaints: denies    Sexual Complaints:not currently    Pain Complaints:no pain.     Recurrent Urinary Tract Infections: use to       Past Obstetrical History:   OB History     Gravida   1    Para   1    Term   1    Preterm        AB        Living   2       SAB        TAB        Ectopic        Multiple   1    Live Births                    C/S x 1-- twins--     Pertinent Gynecological History:    Last pap: 6/21: NIL      Past Medical History:   Past Medical History:   Diagnosis Date    CHF (congestive heart failure)     diastolic, per pt never had acute episode of CHF    Complication of anesthesia     almost chewed through her tongue and severe chills in past    Coronary artery disease     mild non-obstructive ASCAD on heart cath report 12/2015    Depression     Diabetes mellitus  Fibromyalgia     Hidradenitis     Hydradenitis     Hyperlipidemia     Hypertension     Malignant neoplasm of skin     basal/ squamos    Neuropathy     Sleep apnea     Type 2 diabetes mellitus, controlled        Past Surgical History:   Past Surgical History:   Procedure Laterality Date    BREAST SURGERY  1998    reduction x2    CARDIAC SURGERY  12/2015    L heart cath, diagnosis mild non-obstrutive ASCAD    CESAREAN SECTION  2008    with tummy tuck    COSMETIC SURGERY  1990s,2000s    thigh 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    DECOMPRESSION, ANTERIOR CERVICAL, FUSION, LEVELS 2 N/A 09/23/2018    Procedure: DECOMPRESSION, ANTERIOR CERVICAL, FUSION, LEVELS 2;  Surgeon: Nanda Quinton, MD;  Location: [facility];  Service: Neurosurgery;  Laterality: N/A;    DISCECTOMY, ANTERIOR CERVICAL, FUSION, LEVELS 2 N/A 09/23/2018    Procedure: C5-C7 ACDF; Use of operating microscope for microsurgical technique, and improved visualization and illumination; Use of morselized autograft for arthrodesis (CPT Code 54098); Use of morselized allograft for arthrodesis (CPT Code 11914); Injection of autologous platelet-rich plasma to fusion site to enhance arthrodesis (CPT Code 0232T); Use of intraoperative neuromonitoring; Use of intra    HAND SURGERY Right     x3, cyst    INCISION AND DRAINAGE OF WOUND      multiple I&D due to hidradenitis    TONSILLECTOMY         Past Social History:   Social History     Socioeconomic History    Marital status: Single      Spouse name: Not on file    Number of children: Not on file    Years of education: Not on file    Highest education level: Not on file   Occupational History    Not on file   Tobacco Use    Smoking status: Light Tobacco Smoker     Packs/day: 1.50     Years: 30.00     Pack years: 45.00     Last attempt to quit: 09/21/2018     Years since quitting: 1.4    Smokeless tobacco: Never Used   Vaping Use    Vaping Use: Never used   Substance and Sexual Activity    Alcohol use: Never    Drug use: Never    Sexual activity: Not Currently   Other Topics Concern    Not on file   Social History Narrative    Not on file     Social Determinants of Health     Financial Resource Strain:     Difficulty of Paying Living Expenses:    Food Insecurity:     Worried About Programme researcher, broadcasting/film/video in the Last Year:     Barista in the Last Year:    Transportation Needs:     Freight forwarder (Medical):     Lack of Transportation (Non-Medical):    Physical Activity:     Days of Exercise per Week:     Minutes of Exercise per Session:    Stress:     Feeling of Stress :    Social Connections:     Frequency of Communication with Friends and Family:     Frequency of Social Gatherings with Friends and Family:  Attends Religious Services:     Active Member of Clubs or Organizations:     Attends Engineer, structural:     Marital Status:    Intimate Partner Violence:     Fear of Current or Ex-Partner:     Emotionally Abused:     Physically Abused:     Sexually Abused:        Past Family History:   Family History   Problem Relation Age of Onset    Hyperlipidemia Mother     Alcohol abuse Father     Hyperlipidemia Father        Medications:   Current Outpatient Medications on File Prior to Visit   Medication Sig Dispense Refill    aspirin 81 MG EC tablet Take 81 mg by mouth daily      Blood Glucose Monitoring Suppl (GLUCOCOM BLOOD GLUCOSE MONITOR) Device USE TO CHECK BLOOD SUGARS UP TO THREE TIMES DAILY       cloNIDine (CATAPRES) 0.1 MG tablet Take 0.1 mg by mouth as needed      cyclobenzaprine (FLEXERIL) 10 MG tablet cyclobenzaprine 10 mg tablet      DULoxetine (CYMBALTA) 30 MG capsule       empagliflozin (Jardiance) 25 MG tablet Take 25 mg by mouth daily      gabapentin (NEURONTIN) 300 MG capsule gabapentin 300 mg capsule      glucose blood (ONETOUCH VERIO) test strip Check blood sugars 3 times daily.   Diagnosis: Uncontrolled Type 2 DM      glucose blood test strip 1 each by Other route      hydroCHLOROthiazide (HYDRODIURIL) 25 MG tablet Take 12.5 mg by mouth nightly         insulin glargine (BASAGLAR KWIKPEN) 100 UNIT/ML injection pen Inject 52 Units into the skin nightly         Insulin Pen Needle (BD PEN NEEDLE NANO U/F) 32G X 4 MM Misc USE AS DIRECTED DAILY      isosorbide mononitrate (IMDUR) 30 MG 24 hr tablet Take 30 mg by mouth daily      Lancets Misc USE TO CHECK BLOOD SUGARS UP TO THREE TIMES DAILY      metFORMIN (GLUCOPHAGE-XR) 500 MG 24 hr tablet Take 1,000 mg by mouth 2 (two) times daily      metoprolol succinate (TOPROL-XL) 100 MG 24 hr tablet 100 mg daily      naloxone (NARCAN) 4 MG/0.1ML nasal spray 1 spray intranasally. If pt does not respond or relapses into respiratory depression call 911. Give additional doses every 2-3 min. 2 each 0    NUCYNTA ER 150 MG Tablet SR 12 hr Take 100 mg by mouth 2 (two) times daily         nystatin-triamcinolone (MYCOLOG) ointment Apply topically 2 (two) times daily 60 g 1    rosuvastatin (CRESTOR) 10 MG tablet rosuvastatin 10 mg tablet      Semaglutide,0.25 or 0.5MG /DOS, (Ozempic, 0.25 or 0.5 MG/DOSE,) 2 MG/1.5ML Solution Pen-injector Ozempic 0.25 mg or 0.5 mg (2 mg/1.5 mL) subcutaneous pen injector      tapentadol HCl (Nucynta) 50 MG Tab tablet Take 50 mg by mouth every 6 (six) hours Up to 3 times daily      triamcinolone (KENALOG) 0.1 % cream triamcinolone acetonide 0.1 % topical cream   APPLY EXTERNALLY TO THE AFFECTED AREA 1 TO 2 TIMES  DAILY AS NEEDED. AVOID FACE AND GROIN      [DISCONTINUED] Cholecalciferol (VITAMIN D3) 2000 UNIT capsule  Take 2,000 Units by mouth nightly      [DISCONTINUED] DULoxetine (CYMBALTA) 20 MG capsule Take 20 mg by mouth 2 (two) times daily      [DISCONTINUED] glipiZIDE XL (GLUCOTROL XL) 5 MG 24 hr tablet Take 5 mg by mouth nightly         [DISCONTINUED] LORazepam (ATIVAN) 2 MG tablet Take 2 mg by mouth daily as needed         [DISCONTINUED] simvastatin (ZOCOR) 40 MG tablet Take 40 mg by mouth nightly         [DISCONTINUED] triazolam (HALCION) 0.125 MG tablet Take 0.125 mg by mouth nightly as needed          No current facility-administered medications on file prior to visit.       Allergies:   Allergies   Allergen Reactions    Varenicline Rash    Wellbutrin [Bupropion] Other (See Comments)     tachycardia    Tape Other (See Comments)    Enablex [Darifenacin] Rash    Lyrica [Pregabalin] Rash    Vesicare [Solifenacin] Rash         Review of Systems  Pertinent items are noted in HPI.     Urogynecology Questionnaires  scanned       Objective:    Physical Exam  Pelvic floor examination (PFE):    yes Perineal Sensation Present   yes Anal Wink Reflex Present   yes Bulbospongiosus Reflex Present   yes Cough Reflex Present   yes Vulva Normal:   no Vulva / Labia tenderness  no Inner thigh tenderness:   yes Bartholins Glands Normal   yes Urethra Normal   yes Q-Tip angle <90 degrees   yes Normal Bladder Neck   no Stress incontinence demonstrated  no Bladder tenderness present   yes Vagina Atrophic    yes Normal vaginal Capacity:     yes Normal vaginal Mobility:     yes Normal vaginal Vaginal Discharge:   yes Normal Cervix if present:        yes Normal sized 4-6 weeks Uterus   yes Normal Adnexal Exam:       Levator Contractions: 1 - Least-- very high tone pelvic floor   no Involuntary pelvic floor contraction  no Involuntary pelvic floor relaxation  Levator tenderness:   yes Right:   yes Left:       PELVIC ORGAN PROLAPSE  QUANTIFICATION  Normal support          POST VOID RESIDUAL: 0 ml by ultrasound          Urine Dip:                  Assessment:       1. Nocturia  UA/Micro reflex Culture Routine    Ambulatory referral to Physical Therapy   2. High-tone pelvic floor dysfunction  UA/Micro reflex Culture Routine    Ambulatory referral to Physical Therapy   3. Myofascial pain  UA/Micro reflex Culture Routine    Ambulatory referral to Physical Therapy   4. Urinary urgency  UA/Micro reflex Culture Routine    Ambulatory referral to Physical Therapy   5. Voiding dysfunction  UA/Micro reflex Culture Routine    Ambulatory referral to Physical Therapy         Plan:      I spent 45 minutes in counseling and care of this new patient with voiding dysfunction, urinary urgency, nocturia, high tone pelvic floor and myofascial pain.  We discussed the relationship  between the pelvic floor muscles and the bladder symptoms.  Would recommend pelvic floor PT/ vaginal valium.     F/U in 3 months.     Scarlette Slice, MD  [facility]  [address]   [place]   [place]: [phone] F: [phone]

## 2020-03-27 ENCOUNTER — Encounter (INDEPENDENT_AMBULATORY_CARE_PROVIDER_SITE_OTHER): Payer: Self-pay | Admitting: Obstetrics & Gynecology

## 2020-04-04 ENCOUNTER — Inpatient Hospital Stay: Payer: BC Managed Care – PPO | Attending: Obstetrics & Gynecology | Admitting: Physical Therapist

## 2020-04-04 DIAGNOSIS — M6289 Other specified disorders of muscle: Secondary | ICD-10-CM | POA: Insufficient documentation

## 2020-04-04 DIAGNOSIS — R351 Nocturia: Secondary | ICD-10-CM

## 2020-04-04 DIAGNOSIS — R3915 Urgency of urination: Secondary | ICD-10-CM | POA: Insufficient documentation

## 2020-04-04 NOTE — Progress Notes (Signed)
Name:[name] Age: 49 y.o.   Date of Service: 04/04/2020  Referring Physician: Scarlette Slice, MD   Date of Injury: 03/20/2020  Date Care Plan Established/Reviewed: 04/04/2020  Date Treatment Started: 04/04/2020  End of Certification Date: 07/02/2020  Sessions in Plan of Care: 14  Surgery Date: No data was found      Visit Count: 1   Diagnosis:   1. Other specified disorders of muscle    2. Nocturia    3. Urinary urgency        Subjective     History of Present Illness   History of Present Illness: Pt reports having the sensation of needing to urinate all the time and has to void at night multiple times which keeps her up. After voiding she fells the need to void again 5 minutes later; feels like an urgency with UTI but she doesn't have a UTI. At night she voids 2-3 times prior to being able to fall asleep; takes a sleeping pill, OTC, every night. Then still wakes to void 2-4x/night. This started in her early 15's. Pt had testing 15 years ago, she used a bowel spasm medication which stopped the urinary frequency for years; she developed the symptoms again 5-6 years ago.   She has hidradenitis suppurativa affecting the sweat glands resulting in skin lesions and lumps in her armpits and groin. She uses Boil Ease which has benzocaine on her armpits and groin, and on clitoris to numb the urgency. Dr. Lorin [name] prescribed a vaginal valium used last week instead for her clitoris/groin; it helped decrease urgency sleeping through the night, she only got up once last night. Daily urgency is still every hour, not noticing as much.   She denies urinary leaking. No difficulty passing BMs, she takes Nucynta for spine pain that causes constipation, BMs 1-2x/week. No sense of pelvic pressure, not having regular intercourse.   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 Pt drinks soda until 3pm, then mio in water.   Functional Limitations (PLOF): Nocturia, urinary urgency,  incomplete bladder emptying. PLOF same level of function in last several years, prior to 20 years no problems.     Outcome Measure   Tool Used/Details: FOTO Urinary Problem  Score: 75      Precautions: No data was found  Allergies: Varenicline, Wellbutrin [bupropion], Tape, Enablex [darifenacin], Lyrica [pregabalin], and Vesicare [solifenacin]    Past Medical History:   Diagnosis Date    CHF (congestive heart failure)     diastolic, per pt never had acute episode of CHF    Complication of anesthesia     almost chewed through her tongue and severe chills in past    Coronary artery disease     mild non-obstructive ASCAD on heart cath report 12/2015    Depression     Diabetes mellitus     Fibromyalgia     Hidradenitis     Hydradenitis     Hyperlipidemia     Hypertension     Malignant neoplasm of skin     basal/ squamos    Neuropathy     Sleep apnea     Type 2 diabetes mellitus, controlled        Objective   Seated BP: 110/79 mmHg  Seated HR: 94 bpm      Functional Strength   Sit to Stand: independent  Squat: full    Range of Motion   AROM: Lumbar Spine   IE  Lumbar Flexion  WFL   Lumbar Extension  WFL   (blank fields were intentionally left blank)    IE   Left AROM IE   Left PROM   Lumbar/Hip IE   Right AROM IE   Right PROM       Lumbar Rotation        [facility]   Hip Flexion  WFL         Hip Extension  [facility]      [facility]   Hip Abduction  [facility]      [facility]   Hip Adduction  [facility]      [facility]  WFL Hip IR  [facility]  [facility]    [facility]  WFL Hip ER  WFL  WFL   (blank fields were intentionally left blank)          Strength   IE   Left Strength  Hip  MMT IE   Right   4-  Hip Flexion 4    4-  Hip Extension 4-    4  Hip Abduction 4-      Hip Adduction       Hip IR       Hip ER       Quadriceps       Hamstrings     (blank fields were intentionally left blank)    IE   Left Strength  Knee  MMT IE   Right   5  Knee Flexion 5    5  Knee Extension 5    (blank fields were intentionally left blank)    IE   Left Strength  Ankle/Foot  MMT IE   Right   5   Ankle Dorsiflexion 5      Ankle Plantarflexion       Ankle Inversion       Ankle Eversion     (blank fields were intentionally left blank)  Hip IR B 5/5 MMT  Hip ER B 4/5 MMT  Lower abdominals: 2/5 MMT  Pt able to voluntarily contract and relax PFM on external assessment in sidelying.     Neurological Testing     Reflexes   Left   Patellar (L4): normal (2+)  Achilles (S1): normal (2+)    Right   Patellar (L4): normal (2+)  Achilles (S1): normal (2+)             Treatment     Therapeutic Activity - Justified to address the following: activities to improve functional performance.  Discussed bladder diary, how to fill out and assigned three pages.   Discussed normal fluid habits handout and assigned to pt.   Discussed bladder irritants and gave handout, recommended to decrease soda intake over next several weeks.        ---      ---   Total Time   Timed Minutes  15 minutes   Untimed Minutes  30 minutes   Total Time  45 minutes        Assessment   [name] is a 49 y.o. female presenting with nocturia who requires Physical Therapy for the following: urinary urgency, high tone pelvic floor dysfunction, myofascial pain, weakness in bilateral hips and pelvic floor weakness  Pain located: pelvis    Clinical presentation: stable - predictable recovery pattern  Barriers to therapy: Length of time since onset about 20 years ago; medical history including DMT2 from pregnancy, CHF, HTN, scoliosis, present smoker 1.5-2 packs/day  Functional Limitations (PLOF): Nocturia, urinary urgency, incomplete bladder  emptying. PLOF same level of function in last several years, prior to 20 years no problems.   Prognosis: good  Plan   Visits per week: 2  Number of Sessions: 14  Direct One on One  16109: Therapeutic Exercise: To Develop Strength and Endurance, ROM and Flexibility  [id number]: Neuromuscular Reeducation  97140: Manual Therapy techniques (mobilization, manipulation, manual traction) (Soft tissue mobilization and Joint Mobs Grade  1-5)  97530: Therapeutic Activities: Dynamic activities to improve functional performance  60454: Ultrasound  Dry Needling  Supervised Modalities  97010: Thermal modalities: hot/cold packs  09811: Electrical stimulation  97750: Physical performance test or measurement (eg, musculoskeletal, functional capacity).     Next visit PFM assessment, biofeedback for down-training and assign HEP, breathing strategies, review bladder diary, bladder irritants      Goals    Goal 1: Patient will demonstrate independence in prescribed HEP with proper form, sets and reps for safe discharge to an independent program.     Sessions: 14      Goal 2: Pt will demonstrate coordinated pelvic floor contraction at least 10 microVolts for 10 seconds duration with complete relaxation phase under 2 microVolts for 10 seconds to demonstrate coordination of pelvic floor muscles with normal relaxation ability.      Sessions: 14      Goal 3: Pt will demonstrate normal fluid and bladder habits and report not having to wake to void more than once at night due to urgency.    Sessions: 14      Goal 4: Pt will report decreased sensation of urinary urgency and able to control symptoms when they present.    Sessions: 14                                Judithann Sheen, DPT

## 2020-04-07 DIAGNOSIS — R3915 Urgency of urination: Secondary | ICD-10-CM | POA: Insufficient documentation

## 2020-04-07 DIAGNOSIS — M6289 Other specified disorders of muscle: Secondary | ICD-10-CM | POA: Insufficient documentation

## 2020-04-07 DIAGNOSIS — R351 Nocturia: Secondary | ICD-10-CM | POA: Insufficient documentation

## 2020-04-11 ENCOUNTER — Inpatient Hospital Stay: Payer: BC Managed Care – PPO | Attending: Obstetrics & Gynecology | Admitting: Physical Therapist

## 2020-04-11 DIAGNOSIS — R3915 Urgency of urination: Secondary | ICD-10-CM | POA: Insufficient documentation

## 2020-04-11 DIAGNOSIS — R351 Nocturia: Secondary | ICD-10-CM | POA: Insufficient documentation

## 2020-04-11 DIAGNOSIS — M6289 Other specified disorders of muscle: Secondary | ICD-10-CM | POA: Insufficient documentation

## 2020-04-11 NOTE — PT/OT Therapy Note (Signed)
Name: [name] Age: 49 y.o.   Date of Service: 04/11/2020  Referring Physician: Scarlette Slice, MD   Date of Injury: 03/20/2020  Date Care Plan Established/Reviewed: 04/04/2020  Date Treatment Started: 04/04/2020  End of Certification Date: 07/02/2020  Sessions in Plan of Care: 14  Surgery Date: No data was found    Visit Count: 2   Diagnosis:   1. Other specified disorders of muscle    2. Nocturia    3. Urinary urgency         Precautions: No data was found  Allergies: Varenicline, Wellbutrin [bupropion], Tape, Enablex [darifenacin], Lyrica [pregabalin], and Vesicare [solifenacin]    Subjective:  [name] has brought her bladder diary. She reports she has to drink a lot of water because she has dry mouth from her medicines. She also smokes and likes drinking soda to decrease the stinging sensation she gets in the back of her throat; warm drinks help with this too. She has not tried stopping smoking; her relatives lived to their 59's and she does not like the concept of aging that long and she wants to continue her current use of smoking.   Functional Status: Nocturia, urinary urgency, incomplete bladder emptying.    OBJECTIVE: Current Measurements (ROM, Strength, Girth, Outcomes, etc.):  Strength   IE   Left Strength  Hip  MMT IE   Right   4-  Hip Flexion 4    4-  Hip Extension 4-    4  Hip Abduction 4-      Hip Adduction       Hip IR       Hip ER     5  Quadriceps 5    5  Hamstrings 5    (blank fields were intentionally left blank)  External Palpation of lumbopelvic hip complex: WFL    Perineal Observation:   General Skin Condition: some skin tags  Perineal body resting position: within ischial tuberosities  "Contract" pelvic floor: able  "Bulge": able  "Cough": knack reflex present  Anal wink: present    Vulvar Palpation:   Superficial Layer: absence of TTP  Deep Layers: absence of TTP, unable to differentiate pressure on left vs right side of body  S/L Obturator Internus  palpation: WFL  S/L Adductor palpation: WFL    Contraction Ability   Voluntary contraction:   MMT     Right: PERF: 3/10+/3//NT     Left: PERF: 3/9/3//NT     Tissue Laxity Tests   Anterior wall of vagina: WFL  Posterior wall of vagina: WFL    Comments: n/a      THERAPEUTIC EXERCISE (To improve or develop  Flexibility/ROM,  Strength  and endurance)   Warm-up-  Subjective obtained to assist with planning today's visit  Modifications/Patient Education: Progressed exercises and HEP per exercise flow sheet Cuing (Verbal, Visual and Tactile cues).  PFM contractions c PPT in hook-lying 10x    Updated HEP:   Access Code: [email or web address]: [email or web address]: 10/01/2021Prepared by: Gloriann Loan Notes Main thing: avoid soda!   Exercises   Supine Diaphragmatic Breathing with Pelvic Floor Lengthening - 1 x daily - 7 x weekly - 1 sets - 1 reps - 5-10 minutes hold   Supine Pelvic Floor Contraction - 2 x daily - 7 x weekly - 1 sets - 20 reps - 4 seconds hold      MANUAL THERAPY ( To improve joint mobility, soft tissue mobility, and reduce trigger points)   n/a    NEUROMUSCULAR RE-ED (  For activation and/or inhibition of target muscle,- for movement, balance, coordination, kinesthetic sense, posture, and/ or proprioception for sitting and/ or standing activities   Diaphragmatic breathing training for normalizing breathing: pt hook-lying c one hand on chest, one hand on abdomen, cueing for proper breath in through nose and out through pursed lips. Explained relationship of diaphragm and glottis to the pelvic floor. Cueing to feel pelvic floor excursion on inhale and gentle relaxation on exhale.     Physical therapist explained purpose of the treatment and patient gave consent for participating in the treatment: supine internal palpation c one finger 4W10R10T two sets c cueing to relax abdominals, not hold breath. Performed seated [id number] c similar cueing.       THERAPEUTIC ACTIVITY: (For improvement in the  completion of ADL's and functional activities. Dynamic activities to improve functional performance, direct (one-on-one) with the patient  Bladder diary education: pt is drinking too much soda which is a known bladder irritant. Try replacing first drinks in the morning with water, possibly water with lemon to stimulate digestive enzymes for the day. Discussed dietary irritants to the bladder list and which drinks she could swap for soda. Discussed using Ricola throat lozenges if needed for throat dryness after smoking; discussed benefits of alleviating smoking if she desires to stop. Discussed Biotene and XyliMelts to reduce dry mouth, although these have artificial sweeteners which are also bladder irritants.   Discussed benefits of diaphragmatic breathing in the evenings to reduce muscle tension, improve bladder awareness and only need to void once prior to bed ideally.   Reassigned bladder diary.        Assessment (response to treatment):   Pt's bladder diary shows increased fluid consumption and bladder irritants of carbonated beverages. She is now aware of the impact this has on the bladder and her urgency/nocturia symptoms. She has strong pelvic muscle contractions and performed well with muscle instruction.       PLAN :Patient requires continued skilled care to meet goals.   Next visit recheck bladder diary, urge strategies and breathing strategies, biofeedback.       Judithann Sheen, DPT    04/11/2020                            ---      ---   Total Time   Timed Minutes  45 minutes   Total Time  45 minutes           Goals    Goal 1: Patient will demonstrate independence in prescribed HEP with proper form, sets and reps for safe discharge to an independent program.     Sessions: 14      Goal 2: Pt will demonstrate coordinated pelvic floor contraction at least 10 microVolts for 10 seconds duration with complete relaxation phase under 2 microVolts for 10 seconds to demonstrate coordination of pelvic floor muscles  with normal relaxation ability.      Sessions: 14      Goal 3: Pt will demonstrate normal fluid and bladder habits and report not having to wake to void more than once at night due to urgency.    Sessions: 14      Goal 4: Pt will report decreased sensation of urinary urgency and able to control symptoms when they present.    Sessions: 14  Braulio Bosch, DPT

## 2020-04-16 ENCOUNTER — Inpatient Hospital Stay: Payer: BC Managed Care – PPO | Admitting: Physical Therapist

## 2020-04-23 ENCOUNTER — Inpatient Hospital Stay: Payer: BC Managed Care – PPO | Attending: Obstetrics & Gynecology | Admitting: Physical Therapist

## 2020-04-23 DIAGNOSIS — M6289 Other specified disorders of muscle: Secondary | ICD-10-CM | POA: Insufficient documentation

## 2020-04-23 DIAGNOSIS — R3915 Urgency of urination: Secondary | ICD-10-CM | POA: Insufficient documentation

## 2020-04-23 DIAGNOSIS — R351 Nocturia: Secondary | ICD-10-CM | POA: Insufficient documentation

## 2020-04-23 NOTE — PT/OT Therapy Note (Signed)
Name: [name] Age: 49 y.o.   Date of Service: 04/23/2020  Referring Physician: Scarlette Slice, MD   Date of Injury: 03/20/2020  Date Care Plan Established/Reviewed: 04/04/2020  Date Treatment Started: 04/04/2020  End of Certification Date: 07/02/2020  Sessions in Plan of Care: 14  Surgery Date: No data was found    Visit Count: 3   Diagnosis:   1. Other specified disorders of muscle    2. Nocturia    3. Urinary urgency           Precautions: No data was found  Allergies: Varenicline, Wellbutrin [bupropion], Tape, Enablex [darifenacin], Lyrica [pregabalin], and Vesicare [solifenacin]    Subjective:  [name] reports she doesn't feel that she's making progress. She doesn't think that she can relax her muscles well in between contractions. She uses a CPAP machine at night and wakes frequently due to nocturia and then her mouth is very dry so she has to drink at night. Her medications and the smoking cause dry mouth. She has started using less soda and has filled out some bladder diary pages but she has left them at home.   Functional Status: Nocturia, urinary urgency, incomplete bladder emptying.    OBJECTIVE: Current Measurements (ROM, Strength, Girth, Outcomes, etc.):  Strength   IE   Left Strength  Hip  MMT IE   Right   4-  Hip Flexion 4    4-  Hip Extension 4-    4  Hip Abduction 4-      Hip Adduction       Hip IR       Hip ER     5  Quadriceps 5    5  Hamstrings 5    (blank fields were intentionally left blank)  External Palpation of lumbopelvic hip complex: WFL    Perineal Observation:   General Skin Condition: some skin tags  Perineal body resting position: within ischial tuberosities  "Contract" pelvic floor: able  "Bulge": able  "Cough": knack reflex present  Anal wink: present    Vulvar Palpation:   Superficial Layer: absence of TTP  Deep Layers: absence of TTP, unable to differentiate pressure on left vs right side of body  S/L Obturator Internus palpation: WFL  S/L  Adductor palpation: WFL    Contraction Ability   Voluntary contraction:   MMT     Right: PERF: 3/10+/3//NT     Left: PERF: 3/9/3//NT     Tissue Laxity Tests   Anterior wall of vagina: WFL  Posterior wall of vagina: WFL    Comments: n/a      THERAPEUTIC EXERCISE (To improve or develop  Flexibility/ROM,  Strength  and endurance)   Warm-up-  Subjective obtained to assist with planning today's visit  Modifications/Patient Education: Progressed exercises and HEP per exercise flow sheet Cuing (Verbal, Visual and Tactile cues).  PFM contractions c PPT in hook-lying 10x (not today)    HEP:   Access Code: [email or web address]: [email or web address]: 10/01/2021Prepared by: Gloriann Loan Notes Main thing: avoid soda!   Exercises   Supine Diaphragmatic Breathing with Pelvic Floor Lengthening - 1 x daily - 7 x weekly - 1 sets - 1 reps - 5-10 minutes hold   Supine Pelvic Floor Contraction - 2 x daily - 7 x weekly - 1 sets - 20 reps - 4 seconds hold      MANUAL THERAPY ( To improve joint mobility, soft tissue mobility, and reduce trigger points)   n/a    NEUROMUSCULAR RE-ED (For activation  and/or inhibition of target muscle,- for movement, balance, coordination, kinesthetic sense, posture, and/ or proprioception for sitting and/ or standing activities   Diaphragmatic breathing training for normalizing breathing: pt hook-lying c one hand on chest, one hand on abdomen, cueing for proper breath in through nose and out through pursed lips. Explained relationship of diaphragm and glottis to the pelvic floor.    Cueing for diaphragmatic breathing throughout exercise:   Biofeedback supine 5W10R10T several times cueing for breathing, avoiding gluteal or abdominal substitution, cueing for full relaxation of LEs. Cueing for bulges to practice difference. Bulges several times for full difference.   Biofeedback seated 5W10R10T twice cueing as above; practiced APT vs PPT positions, self-palpation of PFM for full contractions and  relaxations. Practiced bulges. Several minutes quick flicks while exhaling only to decrease breath holding.   Assigned reverse kegels.       THERAPEUTIC ACTIVITY: (For improvement in the completion of ADL's and functional activities. Dynamic activities to improve functional performance, direct (one-on-one) with the patient  Not today: Bladder diary education: pt is drinking too much soda which is a known bladder irritant. Try replacing first drinks in the morning with water, possibly water with lemon to stimulate digestive enzymes for the day. Discussed dietary irritants to the bladder list and which drinks she could swap for soda. Discussed using Ricola throat lozenges if needed for throat dryness after smoking; discussed benefits of alleviating smoking if she desires to stop. Discussed Biotene and XyliMelts to reduce dry mouth, although these have artificial sweeteners which are also bladder irritants.   Discussed benefits of diaphragmatic breathing in the evenings to reduce muscle tension, improve bladder awareness and only need to void once prior to bed ideally.   Reassigned bladder diary.        Assessment (response to treatment):   Pt requires cueing to fully relax PFM and to not hold breath with contractions. She responded well to biofeedback today and reported feeling that she could fully relax the muscles and feel it today in between contractions.       PLAN :Patient requires continued skilled care to meet goals.   Next visit recheck bladder diary, breathing strategies, biofeedback down-training       Judithann Sheen, DPT    04/23/2020                                    ---      ---   Total Time   Timed Minutes  40 minutes   Total Time  40 minutes             Goals    Goal 1: Patient will demonstrate independence in prescribed HEP with proper form, sets and reps for safe discharge to an independent program.     Sessions: 14      Goal 2: Pt will demonstrate coordinated pelvic floor contraction at least 10  microVolts for 10 seconds duration with complete relaxation phase under 2 microVolts for 10 seconds to demonstrate coordination of pelvic floor muscles with normal relaxation ability.      Sessions: 14      Goal 3: Pt will demonstrate normal fluid and bladder habits and report not having to wake to void more than once at night due to urgency.    Sessions: 14      Goal 4: Pt will report decreased sensation of urinary urgency and able to control symptoms  when they present.    Sessions: 14                                Judithann Sheen, DPT

## 2020-04-25 ENCOUNTER — Other Ambulatory Visit (HOSPITAL_BASED_OUTPATIENT_CLINIC_OR_DEPARTMENT_OTHER): Payer: BC Managed Care – PPO | Admitting: Neurology

## 2020-04-30 ENCOUNTER — Inpatient Hospital Stay: Payer: BC Managed Care – PPO | Attending: Obstetrics & Gynecology | Admitting: Physical Therapist

## 2020-04-30 DIAGNOSIS — R3915 Urgency of urination: Secondary | ICD-10-CM | POA: Insufficient documentation

## 2020-04-30 DIAGNOSIS — M6289 Other specified disorders of muscle: Secondary | ICD-10-CM | POA: Insufficient documentation

## 2020-04-30 DIAGNOSIS — R351 Nocturia: Secondary | ICD-10-CM | POA: Insufficient documentation

## 2020-04-30 NOTE — PT/OT Therapy Note (Signed)
Name: [name] Age: 49 y.o.   Date of Service: 04/30/2020  Referring Physician: Scarlette Slice, MD   Date of Injury: 03/20/2020  Date Care Plan Established/Reviewed: 04/04/2020  Date Treatment Started: 04/04/2020  End of Certification Date: 07/02/2020  Sessions in Plan of Care: 14  Surgery Date: No data was found    Visit Count: 4   Diagnosis:   1. Other specified disorders of muscle    2. Nocturia    3. Urinary urgency           Precautions: No data was found  Allergies: Varenicline, Wellbutrin [bupropion], Tape, Enablex [darifenacin], Lyrica [pregabalin], and Vesicare [solifenacin]    Subjective:  [name] reports the number of times she has to void right before falling asleep is now 2-3 times instead of 5-6 times. She still feels that she needs to learn how to keep her muscles resting, because otherwise she is either contracted or forcing a bulge. She tried measuring urine output for the bladder diary but did not complete it.   Functional Status: Nocturia, urinary urgency, incomplete bladder emptying.    OBJECTIVE: Current Measurements (ROM, Strength, Girth, Outcomes, etc.):  Strength   IE   Left Strength  Hip  MMT IE   Right   4-  Hip Flexion 4    4-  Hip Extension 4-    4  Hip Abduction 4-      Hip Adduction       Hip IR       Hip ER     5  Quadriceps 5    5  Hamstrings 5    (blank fields were intentionally left blank)  External Palpation of lumbopelvic hip complex: WFL    Perineal Observation:   General Skin Condition: some skin tags  Perineal body resting position: within ischial tuberosities  "Contract" pelvic floor: able  "Bulge": able  "Cough": knack reflex present  Anal wink: present    Vulvar Palpation:   Superficial Layer: absence of TTP  Deep Layers: absence of TTP, unable to differentiate pressure on left vs right side of body  S/L Obturator Internus palpation: WFL  S/L Adductor palpation: WFL    Contraction Ability   Voluntary contraction:   MMT     Right: PERF:  3/10+/3//NT     Left: PERF: 3/9/3//NT     Tissue Laxity Tests   Anterior wall of vagina: WFL  Posterior wall of vagina: WFL    Comments: n/a      THERAPEUTIC EXERCISE (To improve or develop  Flexibility/ROM,  Strength  and endurance)   Warm-up-  Subjective obtained to assist with planning today's visit  Modifications/Patient Education: Progressed exercises and HEP per exercise flow sheet Cuing (Verbal, Visual and Tactile cues).  PFM contractions c PPT in hook-lying 10x (not today)    HEP:   Access Code: [email or web address]: [email or web address]: 10/01/2021Prepared by: Gloriann Loan Notes Main thing: avoid soda!   Exercises   Supine Diaphragmatic Breathing with Pelvic Floor Lengthening - 1 x daily - 7 x weekly - 1 sets - 1 reps - 5-10 minutes hold   Supine Pelvic Floor Contraction - 2 x daily - 7 x weekly - 1 sets - 20 reps - 4 seconds hold      MANUAL THERAPY ( To improve joint mobility, soft tissue mobility, and reduce trigger points)   n/a    NEUROMUSCULAR RE-ED (For activation and/or inhibition of target muscle,- for movement, balance, coordination, kinesthetic sense, posture, and/ or proprioception for sitting  and/ or standing activities   Diaphragmatic breathing training for normalizing breathing: pt hook-lying c one hand on chest, one hand on abdomen, cueing for proper breath in through nose and out through pursed lips. Explained relationship of diaphragm and glottis to the pelvic floor.    Cueing for diaphragmatic breathing throughout exercise:   Biofeedback supine concentrating on full relaxations for several minutes. Then [id number] several times cueing for breathing, avoiding gluteal or abdominal substitution, cueing for full relaxation of LEs. Cueing for bulges several times to practice difference.   Sidelying biofeedback for full relaxations, practiced clamshells 10x, [id number] c similar cueing.   Biofeedback seated [id number] twice cueing as above; practiced APT vs PPT positions, practiced  bulges. Several minutes quick flicks while exhaling only to decrease breath holding.   Biofeedback standing c pelvic position cueing to decrease tension, [id number]      THERAPEUTIC ACTIVITY: (For improvement in the completion of ADL's and functional activities. Dynamic activities to improve functional performance, direct (one-on-one) with the patient  Not today: Bladder diary education: pt is drinking too much soda which is a known bladder irritant. Try replacing first drinks in the morning with water, possibly water with lemon to stimulate digestive enzymes for the day. Discussed dietary irritants to the bladder list and which drinks she could swap for soda. Discussed using Ricola throat lozenges if needed for throat dryness after smoking; discussed benefits of alleviating smoking if she desires to stop. Discussed Biotene and XyliMelts to reduce dry mouth, although these have artificial sweeteners which are also bladder irritants.        Assessment (response to treatment):   Pt requires cueing to fully relax PFM, was better about not holding breath and she is able to fully relax muscles in standing and seated. She felt better relaxation with biofeedback and is more understanding of the difference. She has improving symptoms prior to bed.       PLAN :Patient requires continued skilled care to meet goals.   Next visit continue breathing strategies, biofeedback down-training       Judithann Sheen, DPT    04/30/2020                                    ---      ---   Total Time   Timed Minutes  40 minutes   Total Time  40 minutes             Goals    Goal 1: Patient will demonstrate independence in prescribed HEP with proper form, sets and reps for safe discharge to an independent program.     Sessions: 14      Goal 2: Pt will demonstrate coordinated pelvic floor contraction at least 10 microVolts for 10 seconds duration with complete relaxation phase under 2 microVolts for 10 seconds to demonstrate coordination of pelvic  floor muscles with normal relaxation ability.      Sessions: 14      Goal 3: Pt will demonstrate normal fluid and bladder habits and report not having to wake to void more than once at night due to urgency.    Sessions: 14      Goal 4: Pt will report decreased sensation of urinary urgency and able to control symptoms when they present.    Sessions: 14  Braulio Bosch, DPT

## 2020-05-07 ENCOUNTER — Inpatient Hospital Stay: Payer: BC Managed Care – PPO | Attending: Obstetrics & Gynecology | Admitting: Physical Therapist

## 2020-05-07 DIAGNOSIS — R351 Nocturia: Secondary | ICD-10-CM | POA: Insufficient documentation

## 2020-05-07 DIAGNOSIS — R3915 Urgency of urination: Secondary | ICD-10-CM | POA: Insufficient documentation

## 2020-05-07 DIAGNOSIS — M6289 Other specified disorders of muscle: Secondary | ICD-10-CM | POA: Insufficient documentation

## 2020-05-07 NOTE — PT/OT Therapy Note (Signed)
Name: [name] Age: 49 y.o.   Date of Service: 05/07/2020  Referring Physician: Scarlette Slice, MD   Date of Injury: 03/20/2020  Date Care Plan Established/Reviewed: 04/04/2020  Date Treatment Started: 04/04/2020  End of Certification Date: 07/02/2020  Sessions in Plan of Care: 14  Surgery Date: No data was found    Visit Count: 5   Diagnosis:   1. Other specified disorders of muscle    2. Nocturia    3. Urinary urgency           Precautions: No data was found  Allergies: Varenicline, Wellbutrin [bupropion], Tape, Enablex [darifenacin], Lyrica [pregabalin], and Vesicare [solifenacin]    Subjective:  [name] reports she is under an enormous amount of stress taking care of her father now. She will be going to [place] for a week. She still notes urgency at night, and she tends to push to empty her bladder instead of it emptying by itself.   Functional Status: Nocturia, urinary urgency, incomplete bladder emptying.    OBJECTIVE: Current Measurements (ROM, Strength, Girth, Outcomes, etc.):  Strength   IE   Left Strength  Hip  MMT IE   Right   4-  Hip Flexion 4    4-  Hip Extension 4-    4  Hip Abduction 4-      Hip Adduction       Hip IR       Hip ER     5  Quadriceps 5    5  Hamstrings 5    (blank fields were intentionally left blank)  External Palpation of lumbopelvic hip complex: WFL    Perineal Observation:   General Skin Condition: some skin tags  Perineal body resting position: within ischial tuberosities  "Contract" pelvic floor: able  "Bulge": able  "Cough": knack reflex present  Anal wink: present    Vulvar Palpation:   Superficial Layer: absence of TTP  Deep Layers: absence of TTP, unable to differentiate pressure on left vs right side of body  S/L Obturator Internus palpation: WFL  S/L Adductor palpation: WFL    Contraction Ability   Voluntary contraction:   MMT     Right: PERF: 3/10+/3//NT     Left: PERF: 3/9/3//NT     Tissue Laxity Tests   Anterior wall of vagina:  WFL  Posterior wall of vagina: WFL    Comments: n/a      THERAPEUTIC EXERCISE (To improve or develop  Flexibility/ROM,  Strength  and endurance)   Warm-up-  Subjective obtained to assist with planning today's visit  Modifications/Patient Education: Progressed exercises and HEP per exercise flow sheet Cuing (Verbal, Visual and Tactile cues).  Cat/cow 15x each direction c breath cueing  Sit to stands c PFM contract/exhale on way up, PFM relax/inhale on way down 12x  Sidestepping c RTB 3 laps of 10 ft    HEP:   Access Code: [email or web address]: [email or web address]: 10/01/2021Prepared by: Gloriann Loan Notes Main thing: avoid soda!   Exercises   Supine Diaphragmatic Breathing with Pelvic Floor Lengthening - 1 x daily - 7 x weekly - 1 sets - 1 reps - 5-10 minutes hold   Supine Pelvic Floor Contraction - 2 x daily - 7 x weekly - 1 sets - 20 reps - 4 seconds hold      MANUAL THERAPY ( To improve joint mobility, soft tissue mobility, and reduce trigger points)   n/a    NEUROMUSCULAR RE-ED (For activation and/or inhibition of target muscle,- for movement,  balance, coordination, kinesthetic sense, posture, and/ or proprioception for sitting and/ or standing activities   Diaphragmatic breathing training for normalizing breathing: pt hook-lying c one hand on chest, one hand on abdomen, cueing for proper breath in through nose and out through pursed lips. Explained relationship of diaphragm and glottis to the pelvic floor.    Cueing for diaphragmatic breathing throughout exercise:   Biofeedback supine [id number] several times cueing for breathing, avoiding gluteal or abdominal substitution, cueing for full relaxation of LEs. Cueing for bulges several times to practice difference.   Biofeedback seated [id number] twice cueing as above; practiced APT vs PPT positions, practiced bulges. Practiced several minutes feeling full relaxations c eyes closed and practiced 2x5 quick flicks as if to contract prior to voiding on the  toilet.   Biofeedback standing c pelvic position cueing to decrease tension, [id number]      THERAPEUTIC ACTIVITY: (For improvement in the completion of ADL's and functional activities. Dynamic activities to improve functional performance, direct (one-on-one) with the patient  Re-educated regarding urge strategies and assigned handout.        Assessment (response to treatment):   Pt responded well to biofeedback education and training not to push with her breath when voiding. She demonstrates tendencies to over-compensate with her breath holding. Good response to exercises overall but pt c/o hip fatigue with sidestepping.       PLAN :Patient requires continued skilled care to meet goals.   Next visit continue breathing strategies, biofeedback down-training       Judithann Sheen, DPT    05/07/2020                                    ---      ---   Total Time    Timed Minutes 43 minutes   Total Time 43 minutes             Goals    Goal 1: Patient will demonstrate independence in prescribed HEP with proper form, sets and reps for safe discharge to an independent program.     Sessions: 14      Goal 2: Pt will demonstrate coordinated pelvic floor contraction at least 10 microVolts for 10 seconds duration with complete relaxation phase under 2 microVolts for 10 seconds to demonstrate coordination of pelvic floor muscles with normal relaxation ability.      Sessions: 14      Goal 3: Pt will demonstrate normal fluid and bladder habits and report not having to wake to void more than once at night due to urgency.    Sessions: 14      Goal 4: Pt will report decreased sensation of urinary urgency and able to control symptoms when they present.    Sessions: 14                                Judithann Sheen, DPT

## 2020-05-28 ENCOUNTER — Inpatient Hospital Stay: Payer: BC Managed Care – PPO | Admitting: Physical Therapist

## 2020-06-02 ENCOUNTER — Inpatient Hospital Stay: Payer: BC Managed Care – PPO | Admitting: Physical Therapist

## 2020-06-10 ENCOUNTER — Inpatient Hospital Stay: Payer: BC Managed Care – PPO | Admitting: Physical Therapist

## 2020-06-18 ENCOUNTER — Ambulatory Visit (INDEPENDENT_AMBULATORY_CARE_PROVIDER_SITE_OTHER): Payer: BC Managed Care – PPO | Admitting: Female Pelvic Medicine and Reconstructive Surgery

## 2020-06-18 ENCOUNTER — Encounter: Payer: Self-pay | Admitting: Female Pelvic Medicine and Reconstructive Surgery

## 2020-06-18 VITALS — BP 111/76 | HR 120 | Resp 18 | Wt 188.4 lb

## 2020-06-18 DIAGNOSIS — N398 Other specified disorders of urinary system: Secondary | ICD-10-CM

## 2020-06-18 DIAGNOSIS — R3915 Urgency of urination: Secondary | ICD-10-CM

## 2020-06-18 DIAGNOSIS — R351 Nocturia: Secondary | ICD-10-CM

## 2020-06-18 DIAGNOSIS — M7918 Myalgia, other site: Secondary | ICD-10-CM

## 2020-06-18 DIAGNOSIS — N9489 Other specified conditions associated with female genital organs and menstrual cycle: Secondary | ICD-10-CM

## 2020-06-18 NOTE — Progress Notes (Signed)
Subjective:       Patient ID: [name] is a 49 y.o. female.    HPI    The patient was previously seen by Dr. Lorin Picket for high tone pelvic floor and overactive bladder issues.  She has multiple medical problems including diabetes and CHF.  She followed up with physical therapy however she could not afford the co-pay which is $40 twice per month.  At the same time she is using the Valium vaginal suppository but again because of the cost she is trying to come down on that.    Review of Systems   All other systems reviewed and are negative.        Objective:    Physical Exam  Physical Exam   Nursing note and vitals reviewed.  Constitutional: She is oriented to person, place, and time. She appears well-developed and well-nourished.   HENT:   Head: Normocephalic and atraumatic.   Eyes: Pupils are equal, round, and reactive to light.   Neck: Normal range of motion.   Cardiovascular: Normal rate.    Pulmonary/Chest: Effort normal.   Abdominal: Soft. Bowel sounds are normal. Hernia confirmed negative in the right inguinal area and confirmed negative in the left inguinal area.   Musculoskeletal: Normal range of motion. Normal back, non-tender  Neurological: She is alert and oriented to person, place, and time. She has normal reflexes.   Skin: Skin is warm and dry.   Psychiatric: She has a normal mood and affect. Her behavior is normal. Judgment and thought content normal.         Assessment:       1. Nocturia    2. High-tone pelvic floor dysfunction    3. Myofascial pain    4. Urinary urgency    5. Voiding dysfunction        Plan:      Procedures  No orders of the defined types were placed in this encounter.     I spent 15 minutes with this returning patient, >50% spent on face to face counseling and care coordination.       The ROS included at least one system    The exam included at least 2-4 body systems    The medical decision making was low and included one stable chronic or uncomplicated illness.    The patient  is doing well with her symptoms and physical therapy.  Because of extensive callus she is not in physical therapy anymore and she is going to be weaning of the Valium vaginal suppositories she is going to follow-up with [place] on as needed basis.

## 2020-06-23 ENCOUNTER — Ambulatory Visit (INDEPENDENT_AMBULATORY_CARE_PROVIDER_SITE_OTHER): Payer: BC Managed Care – PPO | Admitting: Neurology

## 2020-06-23 DIAGNOSIS — R202 Paresthesia of skin: Secondary | ICD-10-CM

## 2020-06-23 DIAGNOSIS — G608 Other hereditary and idiopathic neuropathies: Secondary | ICD-10-CM

## 2020-06-23 NOTE — Procedures (Signed)
[facility]  [address]  [place], [place] [address]  P. [phone]   F. [phone]          Patient: [name] Date of Birth: Feb 03, 1971  MEDICAL RECORD NUMBER: [id number] Age: 49 Years 2 Months  Date of Study: 04/25/2020 Diabetes?: Y  Sex: Female Anticoagulant?: Asp  Height: 5 feet 3 inch Pacemaker?: N  Weight: 191 lbs Physician: Dr Chales Abrahams      Summary    The motor conduction test had results outside of the specified normal range in all 4 of the tested nerves:   In the R COMM PERONEAL - EDB study  o the conduction velocity result was reduced for Fib Head-Ankle segment   In the L COMM PERONEAL - EDB study  o the conduction velocity result was reduced for Fib Head-Ankle segment   In the R TIBIAL (KNEE) - AH study  o the conduction velocity result was reduced for Knee-Ankle segment   In the L TIBIAL (KNEE) - AH study  o the conduction velocity result was reduced for Knee-Ankle segment    The sensory conduction test had results outside of the specified normal range in all 4 of the tested nerves:   In the R SURAL study  o the response was considered absent for Calf stimulation   In the L SURAL study  o the response was considered absent for Calf stimulation   In the R SUP PERONEAL study  o the peak latency result was increased for Lat leg stimulation  o the peak amplitude result was reduced for Lat leg stimulation   In the L SUP PERONEAL study  o the response was considered absent for Lat leg stimulation    The needle EMG examination was performed in 10 muscles. It was normal in 5 muscle(s): R. PERON LONGUS, L. VAST MEDIALIS, R. VAST MEDIALIS, L. QUADRICEPS, R. QUADRICEPS. The study was abnormal in 5 muscle(s), with the following distribution:   The MUP waveform abnormality was found in L. TIB ANTERIOR, R. GASTROCN (MED), L. PERON LONGUS.   Abnormal interference pattern was found in R. TIB ANTERIOR, L. GASTROCN (MED), R. GASTROCN (MED), L. PERON LONGUS.     Conclusion:  Abnormal study.  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  There is no evidence of lumbosacral radiculopathy or myopathy.  Clinical correlation advised.    Lynann Bologna, MD  Neurology, Neurophysiology  [facility]    Sensory NCS      Nerve / Sites Rec. Site Onset Lat Peak Lat Ref. NP Amp Ref. PP Amp Segments Distance Onset Vel Ref. Temp.     ms ms ms V V V  cm m/s m/s C   R SURAL      Calf Lat Mall NR NR ?4.20 NR ?7.9 NR Calf - Lat Mall 14 NR ?42.0 22.8   L SURAL      Calf Lat Mall NR NR ?4.20 NR ?7.9 NR Calf - Lat Mall 14 NR ?42.0 22.9   R SUP PERONEAL      Lat leg Ankle 3.80 4.38 ?3.30 4.4 ?13.9 0.02 Lat leg - Ankle 12 31.6  22.9   L SUP PERONEAL      Lat leg Ankle NR NR ?3.30 NR ?13.9 NR Lat leg - Ankle 12 NR  22.9                   Motor NCS  Nerve / Sites Rec. Site Lat Ref. Amp Ref. Seq Amp Ref. Segments Dist. Vel Ref. Temp     ms ms mV mV % %  cm m/s m/s C   R COMM PERONEAL - EDB      Ankle EDB 4.38 ?6.60 4.2 ?1.9 100  Ankle - EDB 9   22.8      Fib Head EDB 11.61  3.0  72.3 ?70 Fib Head - Ankle 25 35 ?41 22.8      Knee EDB 13.96  2.8  90.7 ?70 Knee - Fib Head 11 47 ?39 22.8   L COMM PERONEAL - EDB      Ankle EDB 4.95 ?6.60 2.0 ?1.9 100  Ankle - EDB 9   22.9      Fib Head EDB 15.10  1.0  46.5 ?70 Fib Head - Ankle 26 26 ?41 22.9      Knee EDB 16.77  1.5  154 ?115 Knee - Fib Head 10 60 ?39 22.9   R TIBIAL (KNEE) - AH      Ankle AH 5.73 ?7.00 5.0 ?2.9 100  Ankle - AH 11   22.8      Knee AH 15.99  2.4  48.1 ?50 Knee - Ankle 37 36 ?40 22.8   L TIBIAL (KNEE) - AH      Ankle AH 4.90 ?7.00 5.1 ?2.9 100  Ankle - AH 11   22.8      Knee AH 17.24  2.7  52.5 ?50 Knee - Ankle 37 30 ?40 22.9                   EMG Summary Table     Spontaneous MUAP Recruitment Activation    IA Fib PSW Fasc Amp Dur. Morphology Pattern Effort   L. TIB ANTERIOR N None None None 1+ N Normal N Good   R. TIB ANTERIOR N None None None N N Normal Reduced Good   L. GASTROCN  (MED) N None None None N N Normal Reduced Good   R. GASTROCN (MED) N None None None N 1+ Normal Reduced Good   L. PERON LONGUS N None None None N 1+ Normal Reduced Good   R. PERON LONGUS N None None None N N Normal N Good   L. VAST MEDIALIS N None None None N N Normal N Good   R. VAST MEDIALIS N None None None N N Normal N Good   L. QUADRICEPS N None None None N N Normal N Good   R. QUADRICEPS N None None None N N Normal N Good

## 2020-07-18 ENCOUNTER — Telehealth (INDEPENDENT_AMBULATORY_CARE_PROVIDER_SITE_OTHER): Payer: Self-pay

## 2020-07-18 NOTE — Telephone Encounter (Addendum)
Pt left VM yesterday (01/07) re: medication refill.      Diabetic pt is having yeast infection and wanted refiil on her Nystatin cream. Advise her to check with her PCP as well.    Pt transferred to the front desk make an appt to check out her genital rash in office

## 2020-07-22 ENCOUNTER — Encounter (INDEPENDENT_AMBULATORY_CARE_PROVIDER_SITE_OTHER): Payer: Self-pay | Admitting: Obstetrics & Gynecology

## 2020-07-22 ENCOUNTER — Encounter (INDEPENDENT_AMBULATORY_CARE_PROVIDER_SITE_OTHER): Payer: Self-pay

## 2020-07-22 ENCOUNTER — Ambulatory Visit (INDEPENDENT_AMBULATORY_CARE_PROVIDER_SITE_OTHER): Payer: BC Managed Care – PPO | Admitting: Obstetrics & Gynecology

## 2020-07-22 ENCOUNTER — Telehealth (INDEPENDENT_AMBULATORY_CARE_PROVIDER_SITE_OTHER): Payer: BC Managed Care – PPO | Admitting: Obstetrics & Gynecology

## 2020-07-22 DIAGNOSIS — N763 Subacute and chronic vulvitis: Secondary | ICD-10-CM

## 2020-07-22 MED ORDER — NYSTATIN-TRIAMCINOLONE 100000-0.1 UNIT/GM-% EX OINT
TOPICAL_OINTMENT | Freq: Two times a day (BID) | CUTANEOUS | 3 refills | Status: AC
Start: 2020-07-22 — End: ?

## 2020-07-22 NOTE — Progress Notes (Signed)
TELEMEDICINE VISIT              CLINICAL SUMMARY        Verbal Consent      Verbal consent has been obtained from the patient to conduct a telephone visit encounter: yes      Chief Complaint:      Chief Complaint   Patient presents with    Gynecologic Exam     vulvar itching         Problem List, Medications, and Allergies reviewed: yes      HPI:  Pt has been having some external vaginal itching. Recently diagnosed with COVID and is at home. Is being treated for maculopapular rash on body but can't use those meds on her vulva. Desires a refill of Mycolog cream.           Assessment/Plan:    1. Chronic vulvitis  - nystatin-triamcinolone (MYCOLOG) ointment; Apply topically 2 (two) times daily  Dispense: 60 g; Refill: 3    Discussed use. She will come in if things do not improve.     Time spent in medical discussion: 11 to 20 minutes    Zenon Mayo, MD

## 2020-07-25 ENCOUNTER — Telehealth (INDEPENDENT_AMBULATORY_CARE_PROVIDER_SITE_OTHER): Payer: Self-pay

## 2020-10-15 NOTE — H&P (Signed)
[facility] OFFICE   [address] [place], [place] [address]           [name]    Date of Visit:  01/15/2019  Date of Birth: 13-Oct-1970  Age: 50 yrs.   Medical Record Number: [id number]  Referring Physician: [facility] MD, Venetia Constable  __   CURRENT DIAGNOSES     1. DM Type II uncomplicated, unspecified, E11.9  2. Hypercholesterolemia unspecified, E78.00  3. Hypertension (essential or benign or malignant), I10  4. CAD without  angina, I25.10  5. Carotid occlusion and stenosis unspec carotid artery, I65.29  6. Palpitations, R00.2  __  ALLERGIES     Enablex, Rash  Lyrica, Rash  VESIcare, Rash  Wellbutrin, Tachycardia  __  MEDICATIONS      1. rosuvastatin 10 mg tablet, 1 po qd  2. hydrochlorothiazide 25 mg tablet, 1 po qd  3. isosorbide dinitrate 30 mg tablet, 1 po qd  4. Toprol XL 100 mg tablet,extended release, 1 po qd  5. aspirin 81 mg tablet,delayed release, 1 po qd   __  CHIEF COMPLAINT/REASON FOR VISIT  CAD, Carotid Disease, Hyperlipidemia, Hypertension and Palpitations  __   HISTORY OF PRESENT ILLNESS  Mrs. Molnar is a 50 year old woman on whom we were asked to consult for evaluation of possible carotid disease.     Mrs. Simonian has a history of multiple cardiac  risk factors including type 2 diabetes, hypertension, hyperlipidemia and positive tobacco.    In 2007 she was living in [facility]. She did complain of palpitations. An echocardiogram was done at that time. A stress echo was also  done which suggested inferior wall ischemia. Accordingly in June 2017 she underwent cardiac catheterization which revealed moderate diffuse CAD. She apparently did have some spasm of the ostium of the right coronary artery as well as radial artery spasm.  She was placed on Isosorbide at that time.     She does have a history of cervical myelopathy and a recent MRI commented on plaque in the carotid. She has no neurologic symptoms. She denies chest pain. She experiences palpitations  but very rarely.  She has no PND, orthopnea or pedal edema.  __  PAST HISTORY     Past Medical Illnesses : Ingrown toenail, High Cholesterol, Caroitid Atherosclerosis, Diabetes;  Past Cardiac Illnesses: Palpitations;  Infectious Diseases: Chicken Pox; Surgical Procedures: Anterior Cervical Discectomy and Fusion 09/23/18;  Trauma History: Spine trauma; Cardiology Procedures-Invasive: Cardiac Cath June 2017;  Cardiology Procedures-Noninvasive: Treadmill Stress Test 2018, Echocardiogram 2017, Stress Echocardiogram 2017; Cardiac Cath Results : 50 % stenosis Diag 1, 40 % stenosis Diag 2, 20 % stenosis proximal RCA, 40 % stenosis mid RCA, 20 % stenosis ostial CFX, 20 % stenosis OM 2; Peripheral Vascular Procedures : Arterial NIVA lower extremities 2018  ___  FAMILY HISTORY  Father --  Hypertension  Mother -- Hypertension, High cholesterol  PaternalGrandparent --  Hypertension    __  CARDIAC RISK FACTORS     Tobacco Abuse: currently smoking; Family  History of Heart Disease: positive; Hyperlipidemia: positive;  Hypertension: positive;  Diabetes Mellitus: positive;  Prior History of Heart Disease: positive; Obesity: positive;  Sedentary Life Style:negative; AYT:[id number]; Menopausal :biological menopause  __  SOCIAL HISTORY     Alcohol Use: Does not use alcohol; Smoking: smokes cigarettes, and 1 1/2 ppd; Current every day smoker  ([id number]); Diet: Regular diet without modifications and Caffeine use-3-4 per day; Exercise : Exercises occasionally;   __  REVIEW OF SYSTEMS  General : fatigue; Integumentary: Denies any change in hair or nails, rashes, or skin lesions.; Eyes : reading glasses; Ears, Nose, Throat, Mouth: Denies any hearing loss, epistaxis, hoarseness or difficulty speaking.; Respiratory: sleep apnea; Cardiovascular: Please review HPI, palpitations, Foot pain or numbness;  Abdominal : nausea;Musculoskeletal:Denies any venous insufficiency, arthritic symptoms or back problems.;  Neurological : dizziness;  Psychiatric: anxiety, stress, depression;  Endocrine: Denies any weight change, heat/cold intolerance, polydipsia, or polyuria; Hematologic/Immunologic : seasonal allergies  __  PHYSICAL EXAMINATION    Vital Signs:  Blood Pressure:   118/76 Sitting, Left arm, large cuff    Weight: 199.60 lbs.  Height:  63"  BMI: 35.35   Pulse: 91/min. EKG        Constitutional: Cooperative, alert and oriented,well developed, well nourished, in no acute distress. Skin:  warm and dry to touch Head: normocephalic Eyes : conjunctivae and lids normal ENT: No pallor or cyanosis Neck : no JVD, no bruits Chest: clear to auscultation bilaterally Cardiac : Regular rhythm, S1 normal, S2 normal, No S3 or S4, Apical impulse not displaced, no murmurs, gallops or rubs detected. Peripheral Pulses: The femoral,  popliteal, dorsalis pedis, and posterior tibial pulses are full and equal bilaterally with no bruits auscultated. Extremities/Back: No deformities, clubbing,  cyanosis, erythema or edema observed. There are no spinal abnormalities noted. Normal muscle strength and tone. Neurological: No gross motor or sensory  deficits noted, affect appropriate, oriented to time, person and place.   __    Medications added today by the physician:      ECG:    Normal sinus rhythm with nonspecific changes.    IMPRESSION:  1. Diffuse vascular disease as manifested by mild to moderate CAD and plaquing in the carotid.  2. Hypertension.  3. Hyperlipidemia.  4. Positive tobacco.  5. Diabetes.     RECOMMENDATION:   1. Carotid doppler.  2. Obtain copies of blood work from her primary care. Given her vascular disease and diabetes, LDL cholesterol should be significantly less than 70. Depending  upon the results of the carotid further recommendations will be made.    Joni Reining, MD, [facility]  CMM:lo    cc: Orland Mustard MD  ____________________________  TODAYS ORDERS  Diet mgmt edu, guidance and counseling  TODAY  Smoking cessation education TODAY  12 Lead ECG  Today  Carotid Duplex - PVL 1 week

## 2020-11-13 ENCOUNTER — Ambulatory Visit (INDEPENDENT_AMBULATORY_CARE_PROVIDER_SITE_OTHER): Payer: BC Managed Care – PPO | Admitting: Cardiovascular Disease

## 2020-12-04 ENCOUNTER — Encounter (INDEPENDENT_AMBULATORY_CARE_PROVIDER_SITE_OTHER): Payer: Self-pay | Admitting: Cardiovascular Disease

## 2020-12-04 ENCOUNTER — Ambulatory Visit (INDEPENDENT_AMBULATORY_CARE_PROVIDER_SITE_OTHER): Payer: BC Managed Care – PPO | Admitting: Cardiovascular Disease

## 2020-12-04 VITALS — BP 96/56 | HR 96 | Ht 63.0 in | Wt 193.0 lb

## 2020-12-04 DIAGNOSIS — I5031 Acute diastolic (congestive) heart failure: Secondary | ICD-10-CM

## 2020-12-04 DIAGNOSIS — I779 Disorder of arteries and arterioles, unspecified: Secondary | ICD-10-CM

## 2020-12-04 DIAGNOSIS — I2510 Atherosclerotic heart disease of native coronary artery without angina pectoris: Secondary | ICD-10-CM

## 2020-12-04 DIAGNOSIS — I10 Essential (primary) hypertension: Secondary | ICD-10-CM

## 2020-12-04 DIAGNOSIS — E782 Mixed hyperlipidemia: Secondary | ICD-10-CM

## 2020-12-04 MED ORDER — ISOSORBIDE MONONITRATE ER 30 MG PO TB24
30.00 mg | ORAL_TABLET | Freq: Every day | ORAL | 3 refills | Status: AC
Start: 2020-12-04 — End: ?

## 2020-12-04 MED ORDER — METOPROLOL SUCCINATE ER 100 MG PO TB24
100.00 mg | ORAL_TABLET | Freq: Every day | ORAL | 3 refills | Status: AC
Start: 2020-12-04 — End: ?

## 2020-12-04 NOTE — Progress Notes (Signed)
[facility]   CARDIOLOGY OFFICE PROGRESS NOTE    [facility] OFFICE -CARDIOLOGY  [address]  [place] [address]  Dept: [phone]  Dept Fax: [phone]       Patient Name: [name]    Date of Visit:  Dec 04, 2020  Date of Birth: [birth year]  AGE: 50 y.o.  Medical Record #: [id number]              HISTORY OF PRESENT ILLNESS:    I am seeing [name] for the first time.    She is a 50 year old woman whose cardiac history dates to January 15, 2019 when she was seen in our office in consultation by Dr. Marcelo Baldy for evaluation of possible carotid artery disease.    When she was seen by Dr. Marcelo Baldy, she stated that in 2007, while she was living in [facility], she had palpitations.  She stated that she had an echocardiogram at that time as well as a stress echocardiogram.  These tests echocardiogram suggested inferior wall ischemia.    She underwent cardiac catheterization in June 2017 which revealed moderate diffuse coronary artery disease.  She apparently did have some spasm of the ostium of the right coronary artery as well as radial artery spasm.  She was placed on isosorbide at that time.    She had had a recent MRI for history of cervical myelopathy and was noted with my in the carotids.    Dr. Marcelo Baldy recommended that she have a carotid Doppler.    She had a carotid Doppler on February 19, 2019 which noted bilateral less than 50% internal carotid artery stenosis.    She is now seen here today for further evaluation.    She states that she has been feeling fairly well and continues to be quite active at home.    She states that she does walk 1004s without any difficulties.    She states she lives in a townhouse and is able to open down the stairs without any problems as well.    She states that over the past 3-4 months she has had occasional episodes of palpitations.  She describes this as an isolated skipped heartbeat which occurs at  rest and may once every week or 2.    She states that she has not been following blood pressure at home.    She notes rare episodes of slight lightheadedness when first getting up from a lying or seated position.  This lasts only momentarily and has not changed as of late.    She continues to smoke 1/2 pack of cigarettes per day.    She states she drinks 2-3 cans of caffeinated soda per day.  She does not drink any alcoholic beverages.    She states she has a new job and may be moving to [facility] at some point in time.    PAST MEDICAL HISTORY: She has a past medical history of CHF (congestive heart failure), Complication of anesthesia, Coronary artery disease, Depression, Diabetes mellitus, Fibromyalgia, Hidradenitis, Hydradenitis, Hyperlipidemia, Hypertension, Malignant neoplasm of skin, Neuropathy, Sleep apnea, and Type 2 diabetes mellitus, controlled. She has a past surgical history that includes Cesarean section (2008); Tonsillectomy; Breast surgery (1998); Cosmetic surgery (1990s,2000s); Hand surgery (Right); Cardiac surgery (12/2015); Incision and drainage of wound; DISCECTOMY, ANTERIOR CERVICAL, FUSION, LEVELS 2 (N/A, 09/23/2018); and DECOMPRESSION, ANTERIOR CERVICAL, FUSION, LEVELS 2 (N/A, 09/23/2018).    ALLERGIES:   Allergies  Allergen Reactions    Varenicline Rash    Wellbutrin [Bupropion] Other (See Comments)     tachycardia    Tape Other (See Comments)    Enablex [Darifenacin] Rash    Lyrica [Pregabalin] Rash    Vesicare [Solifenacin] Rash       MEDICATIONS:   Current Outpatient Medications:     aspirin 81 MG EC tablet, Take 81 mg by mouth daily, Disp: , Rfl:     Blood Glucose Monitoring Suppl (GLUCOCOM BLOOD GLUCOSE MONITOR) Device, USE TO CHECK BLOOD SUGARS UP TO THREE TIMES DAILY, Disp: , Rfl:     cloNIDine (CATAPRES) 0.1 MG tablet, Take 0.1 mg by mouth as needed, Disp: , Rfl:     cyclobenzaprine (FLEXERIL) 10 MG tablet, cyclobenzaprine 10 mg tablet, Disp: , Rfl:     DULoxetine  (CYMBALTA) 30 MG capsule, , Disp: , Rfl:     empagliflozin (JARDIANCE) 25 MG tablet, Take 25 mg by mouth daily, Disp: , Rfl:     gabapentin (NEURONTIN) 300 MG capsule, as needed, Disp: , Rfl:     glucose blood test strip, Check blood sugars 3 times daily.   Diagnosis: Uncontrolled Type 2 DM, Disp: , Rfl:     glucose blood test strip, 1 each by Other route, Disp: , Rfl:     hydroCHLOROthiazide (HYDRODIURIL) 25 MG tablet, Take 12.5 mg by mouth nightly  , Disp: , Rfl:     insulin glargine (BASAGLAR KWIKPEN) 100 UNIT/ML injection pen, Inject 52 Units into the skin nightly  , Disp: , Rfl:     Insulin Pen Needle 32G X 4 MM Misc, USE AS DIRECTED DAILY, Disp: , Rfl:     isosorbide mononitrate (IMDUR) 30 MG 24 hr tablet, Take 30 mg by mouth daily, Disp: , Rfl:     Lancets Misc, USE TO CHECK BLOOD SUGARS UP TO THREE TIMES DAILY, Disp: , Rfl:     magnesium 30 MG tablet, Take 30 mg by mouth 2 (two) times daily, Disp: , Rfl:     metFORMIN (GLUCOPHAGE-XR) 500 MG 24 hr tablet, Take 1,000 mg by mouth 2 (two) times daily, Disp: , Rfl:     metoprolol succinate (TOPROL-XL) 100 MG 24 hr tablet, 100 mg daily, Disp: , Rfl:     naloxone (NARCAN) 4 MG/0.1ML nasal spray, 1 spray intranasally. If pt does not respond or relapses into respiratory depression call 911. Give additional doses every 2-3 min., Disp: 2 each, Rfl: 0    NUCYNTA ER 150 MG Tablet SR 12 hr, Take 100 mg by mouth 2 (two) times daily  , Disp: , Rfl:     rosuvastatin (CRESTOR) 10 MG tablet, rosuvastatin 10 mg tablet, Disp: , Rfl:     Semaglutide,0.25 or 0.5MG /DOS, (Ozempic, 0.25 or 0.5 MG/DOSE,) 2 MG/1.5ML Solution Pen-injector, Ozempic 0.25 mg or 0.5 mg (2 mg/1.5 mL) subcutaneous pen injector, Disp: , Rfl:     tapentadol HCl (NUCYNTA) 50 MG Tab tablet, Take 50 mg by mouth every 6 (six) hours Up to 3 times daily, Disp: , Rfl:     triamcinolone (KENALOG) 0.1 % cream, triamcinolone acetonide 0.1 % topical cream  APPLY EXTERNALLY TO THE AFFECTED AREA 1 TO 2  TIMES DAILY AS NEEDED. AVOID FACE AND GROIN, Disp: , Rfl:     nystatin-triamcinolone (MYCOLOG) ointment, Apply topically 2 (two) times daily, Disp: 60 g, Rfl: 3     FAMILY HISTORY: family history includes Alcohol abuse in her father; Hyperlipidemia in her father and mother.  SOCIAL HISTORY: She reports that she has been smoking. She has a 45.00 pack-year smoking history. She has never used smokeless tobacco. She reports that she does not drink alcohol and does not use drugs.    PHYSICAL EXAMINATION    Visit Vitals  BP 96/56 (BP Site: Left arm, Patient Position: Sitting, Cuff Size: Large)   Pulse 96   Ht 1.6 m (5\' 3" )   Wt 87.5 kg (193 lb)   BMI 34.19 kg/m       General Appearance:  Well-appearing in no acute distress.    Skin: Warm and dry to touch.  Head: Normocephalic, no masses or tenderness   Eyes: EOMS Intact, PERRL, conjunctivae and lids unremarkable.  Funduscopic exam and visual fields not performed.   ENT: Ears, Nose and throat reveal no gross abnormalities.  No pallor or cyanosis.   Neck: JVP normal, no carotid bruits, thyroid not enlarged   Chest: Clear to auscultation bilaterally, normal respiratory effort, no wheezes, rales, or rhonchi   Cardiovascular: Regular rhythm, S1 normal, S2 normal, No S3 or S4, Apical impulse not displaced, no murmurs, gallops or rubs  Abdomen: Soft, nontender, nondistended, with normoactive bowel sounds, mildly obese.  Extremities: Warm without edema, clubbing, or cyanosis. 2+ bilateral dorsalis pedis and tibial pulses.  Neuro: Alert and oriented. No gross motor or sensory deficits noted, affect appropriate.                  IMPRESSION:      Hypertension   Palpitations   History of apparent moderate diffuse coronary artery disease with apparent spasm of the ostium of the right coronary artery (cardiac catheterization June 2017 [facility])   Hyperlipidemia   Diabetes mellitus   Bilateral less than 50% internal carotid artery stenosis (carotid Doppler February 19, 2019)   Cervical myelopathy   History of smoking - 1 1/2 packs per day for over 30 years      RECOMMENDATIONS:     Discontinue smoking   Decrease caffeinated beverage intake and maintain vigorous hydration   If her palpitations increase in frequency or are significantly bothersome to her, I would recommend an ambulatory telemetry monitor in order to attempt to identify and quantify any significant arrhythmias she may be having   Blood pressure monitoring at home with diary keeping and follow-up with Dr. Nedra Hai   Check routine laboratory tests including BMP, CBC, and thyroid function test with Dr. Nedra Hai if not done so recently   Follow-up fasting profile and liver function tests   Follow-up carotid Doppler in August 2022   Obtain prior cardiovascular medical records from [facility] for review.  She states she will try to find out the hospital where she had her cardiac catheterization and the name of the cardiologist that she had seen previously.   Follow-up visit in 6 months      Comments:  I suspect [name]' palpitations represent ectopy which, and her results, are not worrisome and she states not particularly bothersome to her at this time.    I discussed above findings and recommendations at length and in great detail with [name].    Thank you very much for allowing me to dissipate in this pleasant woman's care and I will certainly be in contact with you again as her evaluation continues.  If the meantime, there are any further questions, please do not hesitate to contact me.  Orders Placed This Encounter   Procedures    ECG 12 lead (Normal)       No orders of the defined types were placed in this encounter.      SIGNED:    Kurtis Bushman, MD   [facility]          This note was generated by the Dragon speech recognition and may contain errors or omissions not intended by the user. Grammatical errors, random word insertions, deletions, pronoun  errors, and incomplete sentences are occasional consequences of this technology due to software limitations. Not all errors are caught or corrected. If there are questions or concerns about the content of this note or information contained within the body of this dictation, they should be addressed directly with the author for clarification.

## 2020-12-30 ENCOUNTER — Telehealth (INDEPENDENT_AMBULATORY_CARE_PROVIDER_SITE_OTHER): Payer: Self-pay | Admitting: Female Pelvic Medicine and Reconstructive Surgery

## 2020-12-30 NOTE — Telephone Encounter (Signed)
Fax from pharmacy requesting refill of vaginal diazepam 10mg /ml gel. Directions to say "Fill applicator with 4 clickes (1ml = 10mg ) and insert vaginally nightly at bedtime." Please order.

## 2021-01-01 NOTE — Telephone Encounter (Signed)
I don't see anything mentioned regarding this medication in the note and it is not listed in the medications list. Please clarify what medication she is taking, exact instructions on how she is taking it and whether it is a compound medication. Thank you.

## 2021-02-03 ENCOUNTER — Encounter (INDEPENDENT_AMBULATORY_CARE_PROVIDER_SITE_OTHER): Payer: Self-pay | Admitting: Female Pelvic Medicine and Reconstructive Surgery

## 2022-04-05 ENCOUNTER — Encounter: Payer: Self-pay | Admitting: *Deleted
